# Patient Record
Sex: Female | Born: 1935
Health system: Southern US, Community
[De-identification: ages and names within clinical notes are randomized; demographics above are authoritative.]

## PROBLEM LIST (undated history)

## (undated) DIAGNOSIS — K589 Irritable bowel syndrome without diarrhea: Secondary | ICD-10-CM

## (undated) DIAGNOSIS — K219 Gastro-esophageal reflux disease without esophagitis: Secondary | ICD-10-CM

## (undated) DIAGNOSIS — K5792 Diverticulitis of intestine, part unspecified, without perforation or abscess without bleeding: Secondary | ICD-10-CM

## (undated) DIAGNOSIS — E785 Hyperlipidemia, unspecified: Secondary | ICD-10-CM

## (undated) DIAGNOSIS — C4491 Basal cell carcinoma of skin, unspecified: Secondary | ICD-10-CM

## (undated) DIAGNOSIS — K759 Inflammatory liver disease, unspecified: Secondary | ICD-10-CM

## (undated) DIAGNOSIS — K449 Diaphragmatic hernia without obstruction or gangrene: Secondary | ICD-10-CM

## (undated) DIAGNOSIS — I480 Paroxysmal atrial fibrillation: Secondary | ICD-10-CM

## (undated) DIAGNOSIS — Z87442 Personal history of urinary calculi: Secondary | ICD-10-CM

## (undated) DIAGNOSIS — Z9861 Coronary angioplasty status: Secondary | ICD-10-CM

## (undated) DIAGNOSIS — G43909 Migraine, unspecified, not intractable, without status migrainosus: Secondary | ICD-10-CM

## (undated) DIAGNOSIS — IMO0002 Reserved for concepts with insufficient information to code with codable children: Secondary | ICD-10-CM

## (undated) DIAGNOSIS — E039 Hypothyroidism, unspecified: Secondary | ICD-10-CM

## (undated) DIAGNOSIS — J189 Pneumonia, unspecified organism: Secondary | ICD-10-CM

## (undated) DIAGNOSIS — I2510 Atherosclerotic heart disease of native coronary artery without angina pectoris: Secondary | ICD-10-CM

## (undated) DIAGNOSIS — M1990 Unspecified osteoarthritis, unspecified site: Secondary | ICD-10-CM

## (undated) DIAGNOSIS — F419 Anxiety disorder, unspecified: Secondary | ICD-10-CM

## (undated) DIAGNOSIS — G4700 Insomnia, unspecified: Secondary | ICD-10-CM

## (undated) DIAGNOSIS — I10 Essential (primary) hypertension: Secondary | ICD-10-CM

## (undated) HISTORY — PX: CARPAL TUNNEL RELEASE: SHX101

## (undated) HISTORY — DX: Atherosclerotic heart disease of native coronary artery without angina pectoris: I25.10

## (undated) HISTORY — DX: Paroxysmal atrial fibrillation: I48.0

## (undated) HISTORY — PX: CATARACT EXTRACTION W/ INTRAOCULAR LENS  IMPLANT, BILATERAL: SHX1307

## (undated) HISTORY — PX: BASAL CELL CARCINOMA EXCISION: SHX1214

## (undated) HISTORY — DX: Hyperlipidemia, unspecified: E78.5

## (undated) HISTORY — PX: CHOLECYSTECTOMY OPEN: SUR202

## (undated) HISTORY — PX: ABDOMINAL HYSTERECTOMY: SHX81

## (undated) HISTORY — DX: Irritable bowel syndrome, unspecified: K58.9

## (undated) HISTORY — DX: Hypothyroidism, unspecified: E03.9

## (undated) HISTORY — DX: Insomnia, unspecified: G47.00

## (undated) HISTORY — DX: Essential (primary) hypertension: I10

## (undated) HISTORY — PX: CARDIAC CATHETERIZATION: SHX172

## (undated) HISTORY — PX: APPENDECTOMY: SHX54

## (undated) HISTORY — PX: SQUAMOUS CELL CARCINOMA EXCISION: SHX2433

## (undated) HISTORY — PX: CORONARY ANGIOPLASTY WITH STENT PLACEMENT: SHX49

## (undated) HISTORY — DX: Atherosclerotic heart disease of native coronary artery without angina pectoris: Z98.61

---

## 2011-01-05 HISTORY — PX: CORONARY ANGIOPLASTY WITH STENT PLACEMENT: SHX49

## 2011-11-20 DIAGNOSIS — J069 Acute upper respiratory infection, unspecified: Secondary | ICD-10-CM | POA: Diagnosis not present

## 2011-11-20 DIAGNOSIS — J209 Acute bronchitis, unspecified: Secondary | ICD-10-CM | POA: Diagnosis not present

## 2011-12-05 DIAGNOSIS — R002 Palpitations: Secondary | ICD-10-CM | POA: Diagnosis not present

## 2011-12-05 DIAGNOSIS — I2510 Atherosclerotic heart disease of native coronary artery without angina pectoris: Secondary | ICD-10-CM | POA: Diagnosis not present

## 2011-12-05 DIAGNOSIS — R079 Chest pain, unspecified: Secondary | ICD-10-CM | POA: Diagnosis not present

## 2011-12-05 DIAGNOSIS — I4891 Unspecified atrial fibrillation: Secondary | ICD-10-CM | POA: Diagnosis not present

## 2011-12-07 DIAGNOSIS — R002 Palpitations: Secondary | ICD-10-CM | POA: Diagnosis not present

## 2011-12-07 DIAGNOSIS — I4891 Unspecified atrial fibrillation: Secondary | ICD-10-CM | POA: Diagnosis not present

## 2011-12-19 DIAGNOSIS — R002 Palpitations: Secondary | ICD-10-CM | POA: Diagnosis not present

## 2011-12-19 DIAGNOSIS — E785 Hyperlipidemia, unspecified: Secondary | ICD-10-CM | POA: Diagnosis not present

## 2011-12-19 DIAGNOSIS — I4891 Unspecified atrial fibrillation: Secondary | ICD-10-CM | POA: Diagnosis not present

## 2011-12-19 DIAGNOSIS — I2510 Atherosclerotic heart disease of native coronary artery without angina pectoris: Secondary | ICD-10-CM | POA: Diagnosis not present

## 2012-01-05 DIAGNOSIS — I4891 Unspecified atrial fibrillation: Secondary | ICD-10-CM | POA: Diagnosis not present

## 2012-01-05 DIAGNOSIS — D539 Nutritional anemia, unspecified: Secondary | ICD-10-CM | POA: Diagnosis not present

## 2012-01-09 DIAGNOSIS — I2510 Atherosclerotic heart disease of native coronary artery without angina pectoris: Secondary | ICD-10-CM | POA: Diagnosis not present

## 2012-01-09 DIAGNOSIS — E785 Hyperlipidemia, unspecified: Secondary | ICD-10-CM | POA: Diagnosis not present

## 2012-01-09 DIAGNOSIS — I10 Essential (primary) hypertension: Secondary | ICD-10-CM | POA: Diagnosis not present

## 2012-01-09 DIAGNOSIS — I4891 Unspecified atrial fibrillation: Secondary | ICD-10-CM | POA: Diagnosis not present

## 2012-02-06 DIAGNOSIS — I4891 Unspecified atrial fibrillation: Secondary | ICD-10-CM | POA: Diagnosis not present

## 2012-02-06 DIAGNOSIS — I2510 Atherosclerotic heart disease of native coronary artery without angina pectoris: Secondary | ICD-10-CM | POA: Diagnosis not present

## 2012-02-06 DIAGNOSIS — E785 Hyperlipidemia, unspecified: Secondary | ICD-10-CM | POA: Diagnosis not present

## 2012-02-06 DIAGNOSIS — I10 Essential (primary) hypertension: Secondary | ICD-10-CM | POA: Diagnosis not present

## 2012-03-18 DIAGNOSIS — R05 Cough: Secondary | ICD-10-CM | POA: Diagnosis not present

## 2012-03-22 DIAGNOSIS — E7800 Pure hypercholesterolemia, unspecified: Secondary | ICD-10-CM | POA: Diagnosis not present

## 2012-03-22 DIAGNOSIS — E039 Hypothyroidism, unspecified: Secondary | ICD-10-CM | POA: Diagnosis not present

## 2012-03-22 DIAGNOSIS — E118 Type 2 diabetes mellitus with unspecified complications: Secondary | ICD-10-CM | POA: Diagnosis not present

## 2012-03-27 DIAGNOSIS — E119 Type 2 diabetes mellitus without complications: Secondary | ICD-10-CM | POA: Diagnosis not present

## 2012-05-01 DIAGNOSIS — N19 Unspecified kidney failure: Secondary | ICD-10-CM | POA: Diagnosis not present

## 2012-05-01 DIAGNOSIS — I10 Essential (primary) hypertension: Secondary | ICD-10-CM | POA: Diagnosis not present

## 2012-05-01 DIAGNOSIS — L659 Nonscarring hair loss, unspecified: Secondary | ICD-10-CM | POA: Diagnosis not present

## 2012-05-01 DIAGNOSIS — E039 Hypothyroidism, unspecified: Secondary | ICD-10-CM | POA: Diagnosis not present

## 2012-05-01 DIAGNOSIS — E038 Other specified hypothyroidism: Secondary | ICD-10-CM | POA: Diagnosis not present

## 2012-05-06 DIAGNOSIS — I10 Essential (primary) hypertension: Secondary | ICD-10-CM | POA: Diagnosis not present

## 2012-05-06 DIAGNOSIS — Z01810 Encounter for preprocedural cardiovascular examination: Secondary | ICD-10-CM | POA: Diagnosis not present

## 2012-05-06 DIAGNOSIS — E782 Mixed hyperlipidemia: Secondary | ICD-10-CM | POA: Diagnosis not present

## 2012-05-06 DIAGNOSIS — E785 Hyperlipidemia, unspecified: Secondary | ICD-10-CM | POA: Diagnosis not present

## 2012-05-06 DIAGNOSIS — R079 Chest pain, unspecified: Secondary | ICD-10-CM | POA: Diagnosis not present

## 2012-05-06 DIAGNOSIS — I4891 Unspecified atrial fibrillation: Secondary | ICD-10-CM | POA: Diagnosis not present

## 2012-05-06 DIAGNOSIS — I2510 Atherosclerotic heart disease of native coronary artery without angina pectoris: Secondary | ICD-10-CM | POA: Diagnosis not present

## 2012-05-07 DIAGNOSIS — D649 Anemia, unspecified: Secondary | ICD-10-CM | POA: Diagnosis not present

## 2012-05-07 DIAGNOSIS — R17 Unspecified jaundice: Secondary | ICD-10-CM | POA: Diagnosis not present

## 2012-06-14 DIAGNOSIS — E039 Hypothyroidism, unspecified: Secondary | ICD-10-CM | POA: Diagnosis not present

## 2012-06-18 DIAGNOSIS — E039 Hypothyroidism, unspecified: Secondary | ICD-10-CM | POA: Diagnosis not present

## 2012-06-18 DIAGNOSIS — Z23 Encounter for immunization: Secondary | ICD-10-CM | POA: Diagnosis not present

## 2012-06-18 DIAGNOSIS — E119 Type 2 diabetes mellitus without complications: Secondary | ICD-10-CM | POA: Diagnosis not present

## 2012-07-10 DIAGNOSIS — K589 Irritable bowel syndrome without diarrhea: Secondary | ICD-10-CM | POA: Diagnosis not present

## 2012-07-10 DIAGNOSIS — K591 Functional diarrhea: Secondary | ICD-10-CM | POA: Diagnosis not present

## 2012-07-10 DIAGNOSIS — K5730 Diverticulosis of large intestine without perforation or abscess without bleeding: Secondary | ICD-10-CM | POA: Diagnosis not present

## 2012-07-10 DIAGNOSIS — R109 Unspecified abdominal pain: Secondary | ICD-10-CM | POA: Diagnosis not present

## 2012-07-12 DIAGNOSIS — K5732 Diverticulitis of large intestine without perforation or abscess without bleeding: Secondary | ICD-10-CM | POA: Diagnosis not present

## 2012-07-22 DIAGNOSIS — K5732 Diverticulitis of large intestine without perforation or abscess without bleeding: Secondary | ICD-10-CM | POA: Diagnosis not present

## 2012-07-22 DIAGNOSIS — K921 Melena: Secondary | ICD-10-CM | POA: Diagnosis not present

## 2012-09-23 DIAGNOSIS — I10 Essential (primary) hypertension: Secondary | ICD-10-CM | POA: Diagnosis not present

## 2012-09-23 DIAGNOSIS — E039 Hypothyroidism, unspecified: Secondary | ICD-10-CM | POA: Diagnosis not present

## 2012-09-23 DIAGNOSIS — E119 Type 2 diabetes mellitus without complications: Secondary | ICD-10-CM | POA: Diagnosis not present

## 2012-10-17 DIAGNOSIS — Z1231 Encounter for screening mammogram for malignant neoplasm of breast: Secondary | ICD-10-CM | POA: Diagnosis not present

## 2012-10-17 DIAGNOSIS — R922 Inconclusive mammogram: Secondary | ICD-10-CM | POA: Diagnosis not present

## 2012-10-18 DIAGNOSIS — Z1231 Encounter for screening mammogram for malignant neoplasm of breast: Secondary | ICD-10-CM | POA: Diagnosis not present

## 2012-12-26 DIAGNOSIS — K219 Gastro-esophageal reflux disease without esophagitis: Secondary | ICD-10-CM | POA: Diagnosis not present

## 2012-12-26 DIAGNOSIS — I10 Essential (primary) hypertension: Secondary | ICD-10-CM | POA: Diagnosis not present

## 2012-12-26 DIAGNOSIS — G4700 Insomnia, unspecified: Secondary | ICD-10-CM | POA: Diagnosis not present

## 2012-12-26 DIAGNOSIS — E785 Hyperlipidemia, unspecified: Secondary | ICD-10-CM | POA: Diagnosis not present

## 2013-01-06 DIAGNOSIS — I4891 Unspecified atrial fibrillation: Secondary | ICD-10-CM | POA: Diagnosis not present

## 2013-01-06 DIAGNOSIS — I2510 Atherosclerotic heart disease of native coronary artery without angina pectoris: Secondary | ICD-10-CM | POA: Diagnosis not present

## 2013-01-06 DIAGNOSIS — I10 Essential (primary) hypertension: Secondary | ICD-10-CM | POA: Diagnosis not present

## 2013-02-06 DIAGNOSIS — Z0000 Encounter for general adult medical examination without abnormal findings: Secondary | ICD-10-CM | POA: Diagnosis not present

## 2013-02-06 DIAGNOSIS — I10 Essential (primary) hypertension: Secondary | ICD-10-CM | POA: Diagnosis not present

## 2013-02-06 DIAGNOSIS — I4891 Unspecified atrial fibrillation: Secondary | ICD-10-CM | POA: Diagnosis not present

## 2013-02-06 DIAGNOSIS — G4700 Insomnia, unspecified: Secondary | ICD-10-CM | POA: Diagnosis not present

## 2013-02-06 DIAGNOSIS — E039 Hypothyroidism, unspecified: Secondary | ICD-10-CM | POA: Diagnosis not present

## 2013-02-06 DIAGNOSIS — E785 Hyperlipidemia, unspecified: Secondary | ICD-10-CM | POA: Diagnosis not present

## 2013-02-13 DIAGNOSIS — E785 Hyperlipidemia, unspecified: Secondary | ICD-10-CM | POA: Diagnosis not present

## 2013-02-13 DIAGNOSIS — E039 Hypothyroidism, unspecified: Secondary | ICD-10-CM | POA: Diagnosis not present

## 2013-02-13 DIAGNOSIS — I10 Essential (primary) hypertension: Secondary | ICD-10-CM | POA: Diagnosis not present

## 2013-02-13 DIAGNOSIS — I2510 Atherosclerotic heart disease of native coronary artery without angina pectoris: Secondary | ICD-10-CM | POA: Diagnosis not present

## 2013-03-05 ENCOUNTER — Telehealth: Payer: Self-pay | Admitting: Cardiovascular Disease

## 2013-03-05 NOTE — Telephone Encounter (Signed)
Jeanne Hart need a prescription sent to [facility] on [address]  for rapid heart beat .Marland Kitchen The prescription ran out and she could not transfer it to the [facility] here. Please call her if any questions [phone]   Thanks

## 2013-03-07 MED ORDER — AMIODARONE HCL 200 MG PO TABS: 200.0000 mg | ORAL_TABLET | Freq: Every day | ORAL | Status: DC

## 2013-03-07 NOTE — Telephone Encounter (Signed)
rx sent for amiodarone

## 2013-03-07 NOTE — Telephone Encounter (Signed)
Grandson is calling-Still no medicine is at the pharmacy-He does not want there to do without her medicine this week-end! Please call this in by 12 today if possible! She need her Amiodarone 200mg 

## 2013-03-07 NOTE — Telephone Encounter (Signed)
Grandson called-she still have not received her medicine -been waiting since Wednesday-She must have her medicine this week-end! Please,please call

## 2013-03-10 ENCOUNTER — Telehealth: Payer: Self-pay | Admitting: Cardiovascular Disease

## 2013-03-10 NOTE — Telephone Encounter (Signed)
Noted  

## 2013-03-10 NOTE — Telephone Encounter (Signed)
Daughter,Deborah called and wanted [place] to make sure that from now on we make make sure all her medicine goes to [facility] -[address]DGU[address][phone]!

## 2013-04-16 DIAGNOSIS — A088 Other specified intestinal infections: Secondary | ICD-10-CM | POA: Diagnosis not present

## 2013-04-16 DIAGNOSIS — G4700 Insomnia, unspecified: Secondary | ICD-10-CM | POA: Diagnosis not present

## 2013-04-29 ENCOUNTER — Encounter: Payer: Self-pay | Admitting: Physician Assistant

## 2013-04-29 ENCOUNTER — Ambulatory Visit (INDEPENDENT_AMBULATORY_CARE_PROVIDER_SITE_OTHER): Payer: Medicare Other | Admitting: Physician Assistant

## 2013-04-29 VITALS — BP 136/82 | HR 51 | Ht 67.0 in | Wt 166.0 lb

## 2013-04-29 DIAGNOSIS — I2510 Atherosclerotic heart disease of native coronary artery without angina pectoris: Secondary | ICD-10-CM

## 2013-04-29 DIAGNOSIS — I480 Paroxysmal atrial fibrillation: Secondary | ICD-10-CM

## 2013-04-29 DIAGNOSIS — I10 Essential (primary) hypertension: Secondary | ICD-10-CM | POA: Diagnosis not present

## 2013-04-29 DIAGNOSIS — R079 Chest pain, unspecified: Secondary | ICD-10-CM

## 2013-04-29 DIAGNOSIS — Z8719 Personal history of other diseases of the digestive system: Secondary | ICD-10-CM

## 2013-04-29 DIAGNOSIS — R002 Palpitations: Secondary | ICD-10-CM

## 2013-04-29 DIAGNOSIS — I4891 Unspecified atrial fibrillation: Secondary | ICD-10-CM

## 2013-04-29 DIAGNOSIS — E785 Hyperlipidemia, unspecified: Secondary | ICD-10-CM | POA: Diagnosis not present

## 2013-04-29 NOTE — Assessment & Plan Note (Signed)
Treated with pravastatin 

## 2013-04-29 NOTE — Progress Notes (Signed)
Date:  04/29/2013   ID:  Jeanne Hart, DOB Sep 14, 1927, MRN [id number]  PCP:  Thayer Headings, MD  Primary Cardiologist:  Allyson Sabal    History of Present Illness: Jeanne Hart is a 79 y.o. female a history of coronary artery disease, hypertension, dyslipidemia, IBS hypothyroidism, strong family history of heart disease, paroxysmal A. show fibrillation on amiodarone patient moved to [place] from [place] this past winter with closer to her family. She's had a total of 4 stents placed to her coronary arteries. 3 of which are at [facility] and the other one at [place]. Her last nuclear stress test was December 2012 was considered a low-risk test the ejection fraction 76%.    Patient presents today secondary to heart palpitations and lower extremity edema or states that she gets episodes of palpitations once and awhile, which last a short period of time, maybe several minutes and resolve spontaneously. She also reports being feeling weak in the legs. She's had 2 episodes of chest pain which she reports as tightness in 4 of 10 in intensity, with maybe a little bit of diaphoresis. When she had her first episode the other day he took one sublingual nitroglycerin glycerin which would provide relief after a few minutes.  She had an episode this morning which resolved on its own. She also reports dyspnea on exertion when she walks on a hot day and she does a lot of walking apparently.  She also reported one week where she had a few episodes on mild nausea.    The patient currently denies vomiting, fever, shortness of breath, orthopnea, dizziness, PND, cough, congestion, abdominal pain, hematochezia, melena, lower extremity edema, claudication.  Wt Readings from Last 3 Encounters:  04/29/13 166 lb (75.297 kg)     Past Medical History  Diagnosis Date  . Insomnia     chronic  . IBS (irritable bowel syndrome)   . HTN (hypertension)   . Dyslipidemia   . Family history of cardiovascular disease   .  PAF (paroxysmal atrial fibrillation)     coumadin was stopped secondary to GI Bleed, on amiodarone; monitor march 2014-NSR  . CAD (coronary artery disease)     PCI x 2  . Hypothyroidism     Current Outpatient Prescriptions  Medication Sig Dispense Refill  . amiodarone (PACERONE) 200 MG tablet Take 1 tablet (200 mg total) by mouth daily.  30 tablet  6  . aspirin 325 MG EC tablet Take 325 mg by mouth daily.      Marland Kitchen atenolol (TENORMIN) 25 MG tablet Take 25 mg by mouth daily.      Marland Kitchen levothyroxine (SYNTHROID, LEVOTHROID) 100 MCG tablet Take 100 mcg by mouth daily before breakfast.      . pantoprazole (PROTONIX) 40 MG tablet Take 40 mg by mouth daily.      Marland Kitchen zolpidem (AMBIEN CR) 12.5 MG CR tablet Take 12.5 mg by mouth every other day.       No current facility-administered medications for this visit.    Allergies:    Allergies  Allergen Reactions  . Nexium (Esomeprazole)   . Penicillins   . Statins   . Sulfa Antibiotics     Social History:  The patient  reports that she has never smoked. She does not have any smokeless tobacco history on file. She reports that she does not drink alcohol.   Family history:  Strong family history of heart disease. Father had an MI. 2 brothers have had heart disease and one  died at age 51 from MI.  The other had bypass surgery at age 86.  ROS:  Please see the history of present illness.  All other systems reviewed and negative.   PHYSICAL EXAM: VS:  BP 136/82  Pulse 51  Ht 5\' 7"  (1.702 m)  Wt 166 lb (75.297 kg)  BMI 25.99 kg/m2 Well nourished, well developed, in no acute distress HEENT: Pupils are equal round react to light accommodation extraocular movements are intact.  Neck: no JVDNo cervical lymphadenopathy. Cardiac: Regular rate and rhythm without murmurs rubs or gallops. Lungs:  clear to auscultation bilaterally, no wheezing, rhonchi or rales Abd: soft, nontender, positive bowel sounds all quadrants, no hepatosplenomegaly Ext: no lower  extremity edema.  2+ radial and dorsalis pedis pulses. Skin: warm and dry Neuro:  Grossly normal.  Strength is equal in upper and lower extremities about 4/5 in the upper extremities  EKG:  Sinus bradycardia rate of 51 beats per minute no acute changes noted.  Mildly prolonged QTC of 470 ms.  ASSESSMENT AND PLAN:  Problem List Items Addressed This Visit   Paroxysmal atrial fibrillation     Currently maintaining sinus bradycardia.  No acute changes noted. Taking amiodarone 200mg  daily ans full dose ASA. She had previous been treated with Coumadin however this was stopped due to GI bleeding.    Relevant Medications      aspirin 325 MG EC tablet      atenolol (TENORMIN) 25 MG tablet   Palpitations - Primary     If the patient experiences more prolonged episodes of palpitations, she can take an additional atenolol 25 mg. If the episodes become more frequent, and she has associated dizziness, I have asked her to call office for appointment as she may be experiencing atrial fibrillation and need to wear a CardioNet monitor for a more prolonged period of time.      Relevant Orders      EKG 12-Lead   History of GI bleed   Essential hypertension     Blood pressure is only mildly elevated at this time. Appropriate medications.    Relevant Medications      aspirin 325 MG EC tablet      atenolol (TENORMIN) 25 MG tablet   Dyslipidemia     Treated with pravastatin    Coronary artery disease   Relevant Medications      aspirin 325 MG EC tablet      atenolol (TENORMIN) 25 MG tablet   Chest pain     Infrequent episodes which have not occurred while walking.  No acute EKG changes.  NTG took a few minutes to resolve.  I do not believe her symptoms are anginal.  She will call if they become more frequent or worsen.

## 2013-04-29 NOTE — Assessment & Plan Note (Signed)
If the patient experiences more prolonged episodes of palpitations, she can take an additional atenolol 25 mg. If the episodes become more frequent, and she has associated dizziness, I have asked her to call office for appointment as she may be experiencing atrial fibrillation and need to wear a CardioNet monitor for a more prolonged period of time.

## 2013-04-29 NOTE — Assessment & Plan Note (Signed)
Blood pressure is only mildly elevated at this time. Appropriate medications.

## 2013-04-29 NOTE — Assessment & Plan Note (Signed)
Infrequent episodes which have not occurred while walking.  No acute EKG changes.  NTG took a few minutes to resolve.  I do not believe her symptoms are anginal.  She will call if they become more frequent or worsen.

## 2013-04-29 NOTE — Patient Instructions (Signed)
If you find yourself having palpitations for a prolonged period of time like 20-30 minutes, go ahead and take an extra atenolol 25 mg. If her frequency of palpitations increases to 3-4 times per week, for extended periods of time, call our office we may need to put you back on a cardiac monitor.  Also called the having worsening or more frequent chest pain.  Otherwise, you can followup with Dr. Gery Pray in six-months.

## 2013-04-29 NOTE — Assessment & Plan Note (Addendum)
Currently maintaining sinus bradycardia.  No acute changes noted. Taking amiodarone 200mg  daily ans full dose ASA. She had previous been treated with Coumadin however this was stopped due to GI bleeding.

## 2013-05-27 DIAGNOSIS — D485 Neoplasm of uncertain behavior of skin: Secondary | ICD-10-CM | POA: Diagnosis not present

## 2013-05-27 DIAGNOSIS — D239 Other benign neoplasm of skin, unspecified: Secondary | ICD-10-CM | POA: Diagnosis not present

## 2013-05-27 DIAGNOSIS — L821 Other seborrheic keratosis: Secondary | ICD-10-CM | POA: Diagnosis not present

## 2013-05-27 DIAGNOSIS — L723 Sebaceous cyst: Secondary | ICD-10-CM | POA: Diagnosis not present

## 2013-07-15 ENCOUNTER — Encounter: Payer: Self-pay | Admitting: Cardiovascular Disease

## 2013-07-16 ENCOUNTER — Encounter: Payer: Self-pay | Admitting: Cardiovascular Disease

## 2013-07-16 ENCOUNTER — Ambulatory Visit (INDEPENDENT_AMBULATORY_CARE_PROVIDER_SITE_OTHER): Payer: Medicare Other | Admitting: Cardiovascular Disease

## 2013-07-16 VITALS — BP 168/70 | HR 53 | Ht 67.0 in | Wt 165.1 lb

## 2013-07-16 DIAGNOSIS — R079 Chest pain, unspecified: Secondary | ICD-10-CM | POA: Diagnosis not present

## 2013-07-16 DIAGNOSIS — Z79899 Other long term (current) drug therapy: Secondary | ICD-10-CM | POA: Diagnosis not present

## 2013-07-16 DIAGNOSIS — R0609 Other forms of dyspnea: Secondary | ICD-10-CM | POA: Diagnosis not present

## 2013-07-16 DIAGNOSIS — R002 Palpitations: Secondary | ICD-10-CM | POA: Diagnosis not present

## 2013-07-16 DIAGNOSIS — R5381 Other malaise: Secondary | ICD-10-CM

## 2013-07-16 DIAGNOSIS — R0600 Dyspnea, unspecified: Secondary | ICD-10-CM

## 2013-07-16 DIAGNOSIS — I2510 Atherosclerotic heart disease of native coronary artery without angina pectoris: Secondary | ICD-10-CM

## 2013-07-16 DIAGNOSIS — E785 Hyperlipidemia, unspecified: Secondary | ICD-10-CM

## 2013-07-16 DIAGNOSIS — I10 Essential (primary) hypertension: Secondary | ICD-10-CM | POA: Diagnosis not present

## 2013-07-16 NOTE — Progress Notes (Signed)
07/16/2013 Jeanne Hart   July 04, 1927  [id number]  Primary Physician Thayer Headings, MD Primary Cardiologist: Runell Gess MD Roseanne Reno   HPI:  Jeanne Hart is a 79 y.o. female a history of coronary artery disease, hypertension, dyslipidemia, IBS hypothyroidism, strong family history of heart disease, paroxysmal A. show fibrillation on amiodarone patient moved to [place] from [place] this past winter with closer to her family. She's had a total of 4 stents placed to her coronary arteries. 3 of which are at [facility] and the other one at [place]. Her last nuclear stress test was December 2012 was considered a low-risk test the ejection fraction 76%.  Patient presents today secondary to heart palpitations and lower extremity edema or states that she gets episodes of palpitations once and awhile, which last a short period of time, maybe several minutes and resolve spontaneously. She also reports being feeling weak in the legs. She's had 2 episodes of chest pain which she reports as tightness in 4 of 10 in intensity, with maybe a little bit of diaphoresis. When she had her first episode the other day he took one sublingual nitroglycerin glycerin which would provide relief after a few minutes. She had an episode this morning which resolved on its own. She also reports dyspnea on exertion when she walks on a hot day and she does a lot of walking apparently. She also reported one week where she had a few episodes on mild nausea.  The patient currently denies vomiting, fever, shortness of breath, orthopnea, dizziness, PND, cough, congestion, abdominal pain, hematochezia, melena, lower extremity edema, claudication. She saw Huey Bienenstock [facility] in the office 04/29/13. She still complains of dyspnea on exertion and weekly chest pain as well as palpitations.    Current Outpatient Prescriptions  Medication Sig Dispense Refill  . amiodarone (PACERONE) 200 MG tablet Take 1 tablet (200 mg  total) by mouth daily.  30 tablet  6  . aspirin 325 MG EC tablet Take 325 mg by mouth daily.      Marland Kitchen atenolol (TENORMIN) 25 MG tablet Take 25 mg by mouth daily.      Marland Kitchen ipratropium (ATROVENT) 0.03 % nasal spray Place 1 spray into the nose as needed.      Marland Kitchen levothyroxine (SYNTHROID, LEVOTHROID) 100 MCG tablet Take 100 mcg by mouth daily before breakfast.      . pantoprazole (PROTONIX) 40 MG tablet Take 40 mg by mouth daily.      . pravastatin (PRAVACHOL) 40 MG tablet Take 40 mg by mouth daily.      Marland Kitchen zolpidem (AMBIEN CR) 12.5 MG CR tablet Take 12.5 mg by mouth every other day.       No current facility-administered medications for this visit.    Allergies  Allergen Reactions  . Nexium [Esomeprazole]   . Penicillins   . Statins   . Sulfa Antibiotics     History   Social History  . Marital Status: Married    Spouse Name: N/A    Number of Children: N/A  . Years of Education: N/A   Occupational History  . Not on file.   Social History Main Topics  . Smoking status: Never Smoker   . Smokeless tobacco: Not on file  . Alcohol Use: No  . Drug Use: Not on file  . Sexual Activity: Not on file   Other Topics Concern  . Not on file   Social History Narrative  . No narrative on file  Review of Systems: General: negative for chills, fever, night sweats or weight changes.  Cardiovascular: negative for chest pain, dyspnea on exertion, edema, orthopnea, palpitations, paroxysmal nocturnal dyspnea or shortness of breath Dermatological: negative for rash Respiratory: negative for cough or wheezing Urologic: negative for hematuria Abdominal: negative for nausea, vomiting, diarrhea, bright red blood per rectum, melena, or hematemesis Neurologic: negative for visual changes, syncope, or dizziness All other systems reviewed and are otherwise negative except as noted above.    Blood pressure 168/70, pulse 53, height 5\' 7"  (1.702 m), weight 165 lb 1.6 oz (74.889 kg).  General  appearance: alert and no distress Neck: no adenopathy, no carotid bruit, no JVD, supple, symmetrical, trachea midline and thyroid not enlarged, symmetric, no tenderness/mass/nodules Lungs: clear to auscultation bilaterally Heart: regular rate and rhythm, S1, S2 normal, no murmur, click, rub or gallop Extremities: extremities normal, atraumatic, no cyanosis or edema  EKG sinus bradycardia at 53 without ST or T wave changes  ASSESSMENT AND PLAN:   Chest pain Patient gets substernal chest pain one to 2 times a week. She has known CAD with 4 stents in her heart. She does have dyspnea on exertion. I'm going to get a pharmacologic Myoview stress test as well as a 2-D echocardiogram.  Dyslipidemia On statin therapy. We will recheck a lipid and liver profile  Paroxysmal atrial fibrillation Not on oral hydration because of prior GI bleed. She does complain of palpitations. She is on amiodarone. I am going to get a vent monitor for 30 days and review  Essential hypertension Under fair control on current medications      Runell Gess MD [facility], [facility] 07/16/2013 11:29 AM

## 2013-07-16 NOTE — Assessment & Plan Note (Signed)
Not on oral hydration because of prior GI bleed. She does complain of palpitations. She is on amiodarone. I am going to get a vent monitor for 30 days and review

## 2013-07-16 NOTE — Assessment & Plan Note (Signed)
Patient gets substernal chest pain one to 2 times a week. She has known CAD with 4 stents in her heart. She does have dyspnea on exertion. I'm going to get a pharmacologic Myoview stress test as well as a 2-D echocardiogram.

## 2013-07-16 NOTE — Assessment & Plan Note (Signed)
Under fair control on current medications

## 2013-07-16 NOTE — Patient Instructions (Signed)
Your physician recommends that you schedule a follow-up appointment in: 6 Weeks  Your physician recommends that you return for lab work in: CBC, CMP, LIPIDS, TSH  Your physician has requested that you have a lexiscan myoview. For further information please visit [email or web address] Please follow instruction sheet, as given.  Your physician has requested that you have an echocardiogram. Echocardiography is a painless test that uses sound waves to create images of your heart. It provides your doctor with information about the size and shape of your heart and how well your heart's chambers and valves are working. This procedure takes approximately one hour. There are no restrictions for this procedure.  Your physician has recommended that you wear an event monitor. Event monitors are medical devices that record the heart's electrical activity. Doctors most often [place] these monitors to diagnose arrhythmias. Arrhythmias are problems with the speed or rhythm of the heartbeat. The monitor is a small, portable device. You can wear one while you do your normal daily activities. This is usually used to diagnose what is causing palpitations/syncope (passing out).

## 2013-07-16 NOTE — Assessment & Plan Note (Signed)
On statin therapy. We will recheck a lipid and liver profile 

## 2013-07-17 ENCOUNTER — Encounter: Payer: Self-pay | Admitting: Cardiovascular Disease

## 2013-07-17 DIAGNOSIS — R002 Palpitations: Secondary | ICD-10-CM | POA: Diagnosis not present

## 2013-07-21 ENCOUNTER — Telehealth (HOSPITAL_COMMUNITY): Payer: Self-pay | Admitting: *Deleted

## 2013-07-21 NOTE — Telephone Encounter (Signed)
FYI-Pt called and cancelled stress test appt due to experiencing some rapid heart beats last night.

## 2013-07-22 NOTE — Telephone Encounter (Signed)
I spoke with patient and encouraged her to reschedule the stress test.  She agreed to reschedule.  I will send this message back to [facility] for the test to be rescheduled.

## 2013-07-23 ENCOUNTER — Encounter (HOSPITAL_COMMUNITY): Payer: Medicare Other

## 2013-07-29 ENCOUNTER — Ambulatory Visit (HOSPITAL_COMMUNITY)
Admission: RE | Admit: 2013-07-29 | Discharge: 2013-07-29 | Disposition: A | Discharge status: Home / Self Care | Payer: Medicare Other | Source: Ambulatory Visit | Attending: Cardiovascular Disease | Admitting: Cardiovascular Disease

## 2013-07-29 DIAGNOSIS — R079 Chest pain, unspecified: Secondary | ICD-10-CM

## 2013-07-29 DIAGNOSIS — I2510 Atherosclerotic heart disease of native coronary artery without angina pectoris: Secondary | ICD-10-CM | POA: Insufficient documentation

## 2013-07-29 DIAGNOSIS — R002 Palpitations: Secondary | ICD-10-CM

## 2013-07-29 DIAGNOSIS — R0600 Dyspnea, unspecified: Secondary | ICD-10-CM

## 2013-07-29 DIAGNOSIS — R0989 Other specified symptoms and signs involving the circulatory and respiratory systems: Secondary | ICD-10-CM | POA: Diagnosis not present

## 2013-07-29 DIAGNOSIS — R0609 Other forms of dyspnea: Secondary | ICD-10-CM

## 2013-07-29 DIAGNOSIS — R9431 Abnormal electrocardiogram [ECG] [EKG]: Secondary | ICD-10-CM | POA: Insufficient documentation

## 2013-07-29 DIAGNOSIS — R42 Dizziness and giddiness: Secondary | ICD-10-CM | POA: Diagnosis not present

## 2013-07-29 DIAGNOSIS — I10 Essential (primary) hypertension: Secondary | ICD-10-CM | POA: Diagnosis not present

## 2013-07-29 DIAGNOSIS — Z8249 Family history of ischemic heart disease and other diseases of the circulatory system: Secondary | ICD-10-CM | POA: Diagnosis not present

## 2013-07-29 DIAGNOSIS — Z9861 Coronary angioplasty status: Secondary | ICD-10-CM | POA: Diagnosis not present

## 2013-07-29 MED ORDER — TECHNETIUM TC 99M SESTAMIBI GENERIC - CARDIOLITE
32.8000 | Freq: Once | INTRAVENOUS | Status: AC | PRN
  Administered 2013-07-29: 32.8 via INTRAVENOUS

## 2013-07-29 MED ORDER — TECHNETIUM TC 99M SESTAMIBI GENERIC - CARDIOLITE
10.8000 | Freq: Once | INTRAVENOUS | Status: AC | PRN
  Administered 2013-07-29: 11 via INTRAVENOUS

## 2013-07-29 MED ORDER — REGADENOSON 0.4 MG/5ML IV SOLN
0.4000 mg | Freq: Once | INTRAVENOUS | Status: AC
  Administered 2013-07-29: 0.4 mg via INTRAVENOUS

## 2013-07-29 NOTE — Procedures (Addendum)
[facility] [address] [place] [address] [place] [address] [phone]  Cardiology Nuclear Med Study  Jeanne Hart is a 79 y.o. female     MRN : [id number]     DOB: 1927-02-07  Procedure Date: 07/29/2013  Nuclear Med Background Indication for Stress Test:  Evaluation for Ischemia, Stent Patency and Abnormal EKG History:  CAD;STENT X4/PTCA Cardiac Risk Factors: Family History - CAD, Hypertension and Lipids  Symptoms:  Chest Pain, Dizziness, DOE, Fatigue, Palpitations and SOB   Nuclear Pre-Procedure Caffeine/Decaff Intake:  7:00pm NPO After: 5:00am   IV Site: R Hand  IV 0.9% NS with Angio Cath:  22g  Chest Size (in):  N/A IV Started by: Emmit Pomfret, RN  Height: 5\' 7"  (1.702 m)  Cup Size: D  BMI:  Body mass index is 25.84 kg/(m^2). Weight:  165 lb (74.844 kg)   Tech Comments:  N/A    Nuclear Med Study 1 or 2 day study: 1 day  Stress Test Type:  Lexiscan  Order Authorizing Provider:  Nanetta Batty, MD   Resting Radionuclide: Technetium 59m Sestamibi  Resting Radionuclide Dose: 10.8 mCi   Stress Radionuclide:  Technetium 71m Sestamibi  Stress Radionuclide Dose: 32.8 mCi           Stress Protocol Rest HR:52 Stress HR:  57  Rest BP:  179/100 Stress BP:  178/96  Exercise Time (min): n/a METS: n/a          Dose of Adenosine (mg):  n/a Dose of Lexiscan: 0.4 mg  Dose of Atropine (mg): n/a Dose of Dobutamine: n/a mcg/kg/min (at max HR)  Stress Test Technologist: Esperanza Sheets, CCT Nuclear Technologist: Gonzella Lex, CNMT   Rest Procedure:  Myocardial perfusion imaging was performed at rest 45 minutes following the intravenous administration of Technetium 59m Sestamibi. Stress Procedure:  The patient received IV Lexiscan 0.4 mg over 15-seconds.  Technetium 99m Sestamibi injected at 30-seconds.  There were no significant changes with Lexiscan.  Quantitative spect images were obtained after a 45 minute delay.  Transient  Ischemic Dilatation (Normal <1.22):  1.06 Lung/Heart Ratio (Normal <0.45):  0.23 QGS EDV:  78 ml QGS ESV:  23 ml LV Ejection Fraction: 66%     Rest ECG: NSR - Normal EKG  Stress ECG: No significant change from baseline ECG  QPS Raw Data Images:  Normal; no motion artifact; normal heart/lung ratio. Stress Images:  Normal homogeneous uptake in all areas of the myocardium. Rest Images:  Normal homogeneous uptake in all areas of the myocardium. Subtraction (SDS):  No evidence of ischemia. LV Wall Motion:  NL LV Function; NL Wall Motion  Impression Exercise Capacity:  Lexiscan with no exercise. BP Response:  Normal blood pressure response. Clinical Symptoms:  No significant symptoms noted. ECG Impression:  No significant ECG changes with Lexiscan. Comparison with Prior Nuclear Study: No change from previous nuclear study performed 2012.   Overall Impression:  Normal stress nuclear study.   Jeanne Poucher, MD  07/29/2013 1:20 PM

## 2013-07-31 ENCOUNTER — Ambulatory Visit (HOSPITAL_COMMUNITY)
Admission: RE | Admit: 2013-07-31 | Discharge: 2013-07-31 | Disposition: A | Discharge status: Home / Self Care | Payer: Medicare Other | Source: Ambulatory Visit | Attending: Cardiology | Admitting: Cardiology

## 2013-07-31 DIAGNOSIS — R002 Palpitations: Secondary | ICD-10-CM | POA: Insufficient documentation

## 2013-07-31 DIAGNOSIS — R0609 Other forms of dyspnea: Secondary | ICD-10-CM | POA: Insufficient documentation

## 2013-07-31 DIAGNOSIS — R0989 Other specified symptoms and signs involving the circulatory and respiratory systems: Secondary | ICD-10-CM | POA: Insufficient documentation

## 2013-07-31 DIAGNOSIS — R0600 Dyspnea, unspecified: Secondary | ICD-10-CM

## 2013-07-31 DIAGNOSIS — R0602 Shortness of breath: Secondary | ICD-10-CM

## 2013-07-31 DIAGNOSIS — R079 Chest pain, unspecified: Secondary | ICD-10-CM | POA: Insufficient documentation

## 2013-07-31 NOTE — Progress Notes (Signed)
2D Echo Performed 07/31/2013    Lezlie Ritchey, RCS  

## 2013-08-01 ENCOUNTER — Encounter: Payer: Self-pay | Admitting: *Deleted

## 2013-08-04 DIAGNOSIS — Z23 Encounter for immunization: Secondary | ICD-10-CM | POA: Diagnosis not present

## 2013-08-18 ENCOUNTER — Encounter: Payer: Self-pay | Admitting: Gastroenterology

## 2013-08-26 ENCOUNTER — Other Ambulatory Visit: Payer: Self-pay | Admitting: *Deleted

## 2013-08-26 DIAGNOSIS — R002 Palpitations: Secondary | ICD-10-CM

## 2013-08-26 DIAGNOSIS — R079 Chest pain, unspecified: Secondary | ICD-10-CM

## 2013-08-26 DIAGNOSIS — R0600 Dyspnea, unspecified: Secondary | ICD-10-CM

## 2013-08-27 ENCOUNTER — Encounter: Payer: Self-pay | Admitting: Cardiovascular Disease

## 2013-08-27 ENCOUNTER — Ambulatory Visit (INDEPENDENT_AMBULATORY_CARE_PROVIDER_SITE_OTHER): Payer: Medicare Other | Admitting: Cardiovascular Disease

## 2013-08-27 VITALS — BP 212/90 | HR 56 | Ht 67.0 in | Wt 160.0 lb

## 2013-08-27 DIAGNOSIS — I480 Paroxysmal atrial fibrillation: Secondary | ICD-10-CM

## 2013-08-27 DIAGNOSIS — I10 Essential (primary) hypertension: Secondary | ICD-10-CM

## 2013-08-27 DIAGNOSIS — I4891 Unspecified atrial fibrillation: Secondary | ICD-10-CM | POA: Diagnosis not present

## 2013-08-27 DIAGNOSIS — I2510 Atherosclerotic heart disease of native coronary artery without angina pectoris: Secondary | ICD-10-CM

## 2013-08-27 NOTE — Patient Instructions (Signed)
Your physician wants you to follow-up in: 6 months with an extender and 1 year with Dr Berry. You will receive a reminder letter in the mail two months in advance. If you don't receive a letter, please call our office to schedule the follow-up appointment.  

## 2013-08-27 NOTE — Progress Notes (Signed)
08/27/2013 Jeanne Hart   July 11, 1927  [id number]  Primary Physician Thayer Headings, MD Primary Cardiologist: Runell Gess MD Roseanne Reno   HPI:  Jeanne Hart is a 79 y.o. female a history of coronary artery disease, hypertension, dyslipidemia, IBS hypothyroidism, strong family history of heart disease, paroxysmal A. show fibrillation on amiodarone patient moved to [place] from [place] this past winter with closer to her family. She's had a total of 4 stents placed to her coronary arteries. 3 of which are at [facility] and the other one at [place]. Her last nuclear stress test was December 2012 was considered a low-risk test the ejection fraction 76%.  Patient presents today secondary to heart palpitations and lower extremity edema or states that she gets episodes of palpitations once and awhile, which last a short period of time, maybe several minutes and resolve spontaneously. She also reports being feeling weak in the legs. She's had 2 episodes of chest pain which she reports as tightness in 4 of 10 in intensity, with maybe a little bit of diaphoresis. When she had her first episode the other day he took one sublingual nitroglycerin glycerin which would provide relief after a few minutes. She had an episode this morning which resolved on its own. She also reports dyspnea on exertion when she walks on a hot day and she does a lot of walking apparently. She also reported one week where she had a few episodes on mild nausea.  The patient currently denies vomiting, fever, shortness of breath, orthopnea, dizziness, PND, cough, congestion, abdominal pain, hematochezia, melena, lower extremity edema, claudication.  She saw Huey Bienenstock [facility] in the office 04/29/13. She still complains of dyspnea on exertion and weekly chest pain as well as palpitations. I obtained a Myoview stress test which was entirely normal as was a 2-D echocardiogram.    Current Outpatient Prescriptions    Medication Sig Dispense Refill  . amiodarone (PACERONE) 200 MG tablet Take 1 tablet (200 mg total) by mouth daily.  30 tablet  6  . aspirin 325 MG EC tablet Take 325 mg by mouth daily.      Marland Kitchen atenolol (TENORMIN) 25 MG tablet Take 25 mg by mouth daily.      Marland Kitchen ipratropium (ATROVENT) 0.03 % nasal spray Place 1 spray into the nose as needed.      Marland Kitchen levothyroxine (SYNTHROID, LEVOTHROID) 100 MCG tablet Take 100 mcg by mouth daily before breakfast.      . Multiple Vitamin (MULTIVITAMIN) capsule Take 1 capsule by mouth daily.      . nitroGLYCERIN (NITROSTAT) 0.4 MG SL tablet Place 0.4 mg under the tongue every 5 (five) minutes as needed for chest pain.      . pantoprazole (PROTONIX) 40 MG tablet Take 40 mg by mouth daily.      Marland Kitchen zolpidem (AMBIEN CR) 12.5 MG CR tablet Take 12.5 mg by mouth every other day.       No current facility-administered medications for this visit.    Allergies  Allergen Reactions  . Nexium [Esomeprazole]   . Penicillins   . Statins   . Sulfa Antibiotics     History   Social History  . Marital Status: Married    Spouse Name: N/A    Number of Children: N/A  . Years of Education: N/A   Occupational History  . Not on file.   Social History Main Topics  . Smoking status: Never Smoker   . Smokeless tobacco: Not on  file  . Alcohol Use: No  . Drug Use: Not on file  . Sexual Activity: Not on file   Other Topics Concern  . Not on file   Social History Narrative  . No narrative on file     Review of Systems: General: negative for chills, fever, night sweats or weight changes.  Cardiovascular: negative for chest pain, dyspnea on exertion, edema, orthopnea, palpitations, paroxysmal nocturnal dyspnea or shortness of breath Dermatological: negative for rash Respiratory: negative for cough or wheezing Urologic: negative for hematuria Abdominal: negative for nausea, vomiting, diarrhea, bright red blood per rectum, melena, or hematemesis Neurologic: negative for  visual changes, syncope, or dizziness All other systems reviewed and are otherwise negative except as noted above.    Blood pressure 212/90, pulse 56, height 5\' 7"  (1.702 m), weight 160 lb (72.576 kg).  General appearance: alert and no distress Neck: no adenopathy, no carotid bruit, no JVD, supple, symmetrical, trachea midline and thyroid not enlarged, symmetric, no tenderness/mass/nodules Lungs: clear to auscultation bilaterally Heart: regular rate and rhythm, S1, S2 normal, no murmur, click, rub or gallop Extremities: extremities normal, atraumatic, no cyanosis or edema  EKG not performed today  ASSESSMENT AND PLAN:   Coronary artery disease status post multiple stents in the past with fatigue and pelvic surgery. She's had no recurrent chest pain. A recent Myoview stress test and 2-D echo were entirely normal.  Essential hypertension Her blood pressure is elevated today but she says she is under a lot of stress recently  Paroxysmal atrial fibrillation Maintaining sinus rhythm on amiodarone      Runell Gess MD [facility], [facility] 08/27/2013 11:58 AM

## 2013-08-27 NOTE — Assessment & Plan Note (Signed)
Her blood pressure is elevated today but she says she is under a lot of stress recently

## 2013-08-27 NOTE — Assessment & Plan Note (Signed)
status post multiple stents in the past with fatigue and pelvic surgery. She's had no recurrent chest pain. A recent Myoview stress test and 2-D echo were entirely normal.

## 2013-08-27 NOTE — Assessment & Plan Note (Signed)
Maintaining sinus rhythm on amiodarone 

## 2013-09-17 ENCOUNTER — Ambulatory Visit (INDEPENDENT_AMBULATORY_CARE_PROVIDER_SITE_OTHER): Payer: Medicare Other | Admitting: Gastroenterology

## 2013-09-17 ENCOUNTER — Encounter: Payer: Self-pay | Admitting: Gastroenterology

## 2013-09-17 VITALS — BP 160/88 | HR 60 | Ht 67.0 in | Wt 159.4 lb

## 2013-09-17 DIAGNOSIS — K589 Irritable bowel syndrome without diarrhea: Secondary | ICD-10-CM

## 2013-09-17 NOTE — Patient Instructions (Signed)
Please start taking citrucel (orange flavored) powder fiber supplement.  This may cause some bloating at first but that usually goes away. Begin with a small spoonful and work your way up to a large, heaping spoonful daily over a week. Call to report on your symptoms, response to fiber supplements in 4 weeks.

## 2013-09-17 NOTE — Progress Notes (Signed)
HPI: This is a  very pleasant 79 year old woman who is here to establish local GI care.   Moved from [place] about a year ago.  Has a grandson locally.  Happy with the move.  Diagnosed with IBS.  Most recent EGD 01/2011 (mild esophagitis) in [place].  [facility]  This showed diverticulosis, random biopsies were normal. No polyps or cancers.  Normally she will have BM, usually loose.  Sometimes will have multkple loose stools.  Sometimes just gas.  Recently serious constipation. Had to really strain to move bowels.  Still alternates a bit  Jaundiced as a child.  Has never tried fiber supplements.  Overall weight up a bit.  No overt bleeding.  Review of systems: Pertinent positive and negative review of systems were noted in the above HPI section. Complete review of systems was performed and was otherwise normal.    Past Medical History  Diagnosis Date  . Insomnia     chronic  . IBS (irritable bowel syndrome)   . HTN (hypertension)   . Dyslipidemia   . Family history of cardiovascular disease   . PAF (paroxysmal atrial fibrillation)     coumadin was stopped secondary to GI Bleed, on amiodarone; monitor march 2014-NSR  . CAD (coronary artery disease)     PCI x 2  . Hypothyroidism   . Chest pain   . Dyspnea on exertion     Past Surgical History  Procedure Laterality Date  . Coronary angioplasty with stent placement  01/05/2011    2.5x63mm Promus DES stent to RCA post dilated to 2.75x71mm   . Coronary angioplasty with stent placement  ?    3 stents per patient  . Appendectomy    . Cholecystectomy    . Abdominal hysterectomy      Current Outpatient Prescriptions  Medication Sig Dispense Refill  . amiodarone (PACERONE) 200 MG tablet Take 1 tablet (200 mg total) by mouth daily.  30 tablet  6  . aspirin 325 MG EC tablet Take 325 mg by mouth daily.      Marland Kitchen atenolol (TENORMIN) 25 MG tablet Take 25 mg by mouth daily.      Marland Kitchen ipratropium (ATROVENT) 0.03 % nasal spray  Place 1 spray into the nose as needed.      Marland Kitchen levothyroxine (SYNTHROID, LEVOTHROID) 100 MCG tablet Take 100 mcg by mouth daily before breakfast.      . Multiple Vitamin (MULTIVITAMIN) capsule Take 1 capsule by mouth daily.      . nitroGLYCERIN (NITROSTAT) 0.4 MG SL tablet Place 0.4 mg under the tongue every 5 (five) minutes as needed for chest pain.      . pantoprazole (PROTONIX) 40 MG tablet Take 40 mg by mouth daily.      . pravastatin (PRAVACHOL) 10 MG tablet       . zolpidem (AMBIEN CR) 12.5 MG CR tablet Take 12.5 mg by mouth every other day.       No current facility-administered medications for this visit.    Allergies as of 09/17/2013 - Review Complete 09/17/2013  Allergen Reaction Noted  . Morphine and related  09/17/2013  . Nexium [esomeprazole]  04/29/2013  . Penicillins  04/29/2013  . Statins  04/29/2013  . Sulfa antibiotics  04/29/2013    Family History  Problem Relation Age of Onset  . Diabetes Maternal Aunt   . Diabetes Son   . Heart disease      Both sides of family    History   Social  History  . Marital Status: Married    Spouse Name: N/A    Number of Children: 3  . Years of Education: N/A   Occupational History  . Sales/Office    Social History Main Topics  . Smoking status: Never Smoker   . Smokeless tobacco: Never Used  . Alcohol Use: No  . Drug Use: No  . Sexual Activity: Not on file   Other Topics Concern  . Not on file   Social History Narrative  . No narrative on file       Physical Exam: BP 160/88  Pulse 60  Ht 5\' 7"  (1.702 m)  Wt 159 lb 6.4 oz (72.303 kg)  BMI 24.96 kg/m2 Constitutional: generally well-appearing Psychiatric: alert and oriented x3 Eyes: extraocular movements intact Mouth: oral pharynx moist, no lesions Neck: supple no lymphadenopathy Cardiovascular: heart regular rate and rhythm Lungs: clear to auscultation bilaterally Abdomen: soft, nontender, nondistended, no obvious ascites, no peritoneal signs, normal  bowel sounds Extremities: no lower extremity edema bilaterally Skin: no lesions on visible extremities    Assessment and plan: 79 y.o. female with  diarrhea predominant IBS  She has had thorough gastrointestinal workup as recently as 2012. I see no further testing is needed. Like I fiber supplements for her loose stools. It isn't always effective and I generally try in the median first however she had constipation episode recently that really locked her up for several days in a bit reluctant to risk that again. She will call to report on her symptoms in 4 weeks and sooner if needed.

## 2013-09-18 ENCOUNTER — Other Ambulatory Visit: Payer: Self-pay | Admitting: Cardiovascular Disease

## 2013-09-18 NOTE — Telephone Encounter (Signed)
Rx was sent to pharmacy electronically. 

## 2013-10-30 DIAGNOSIS — J069 Acute upper respiratory infection, unspecified: Secondary | ICD-10-CM | POA: Diagnosis not present

## 2013-12-05 DIAGNOSIS — R059 Cough, unspecified: Secondary | ICD-10-CM | POA: Diagnosis not present

## 2013-12-05 DIAGNOSIS — R05 Cough: Secondary | ICD-10-CM | POA: Diagnosis not present

## 2014-02-21 ENCOUNTER — Emergency Department (HOSPITAL_BASED_OUTPATIENT_CLINIC_OR_DEPARTMENT_OTHER): Payer: Medicare Other

## 2014-02-21 ENCOUNTER — Emergency Department (HOSPITAL_BASED_OUTPATIENT_CLINIC_OR_DEPARTMENT_OTHER)
Admission: EM | Admit: 2014-02-21 | Discharge: 2014-02-22 | Disposition: A | Discharge status: Home / Self Care | Payer: Medicare Other | Attending: Emergency Medicine | Admitting: Emergency Medicine

## 2014-02-21 DIAGNOSIS — E039 Hypothyroidism, unspecified: Secondary | ICD-10-CM | POA: Diagnosis not present

## 2014-02-21 DIAGNOSIS — I2510 Atherosclerotic heart disease of native coronary artery without angina pectoris: Secondary | ICD-10-CM | POA: Diagnosis not present

## 2014-02-21 DIAGNOSIS — S93602A Unspecified sprain of left foot, initial encounter: Secondary | ICD-10-CM

## 2014-02-21 DIAGNOSIS — Z7982 Long term (current) use of aspirin: Secondary | ICD-10-CM | POA: Insufficient documentation

## 2014-02-21 DIAGNOSIS — Z79899 Other long term (current) drug therapy: Secondary | ICD-10-CM | POA: Insufficient documentation

## 2014-02-21 DIAGNOSIS — S93409A Sprain of unspecified ligament of unspecified ankle, initial encounter: Secondary | ICD-10-CM | POA: Diagnosis not present

## 2014-02-21 DIAGNOSIS — Z8719 Personal history of other diseases of the digestive system: Secondary | ICD-10-CM | POA: Diagnosis not present

## 2014-02-21 DIAGNOSIS — S93402A Sprain of unspecified ligament of left ankle, initial encounter: Secondary | ICD-10-CM

## 2014-02-21 DIAGNOSIS — Y929 Unspecified place or not applicable: Secondary | ICD-10-CM | POA: Insufficient documentation

## 2014-02-21 DIAGNOSIS — E785 Hyperlipidemia, unspecified: Secondary | ICD-10-CM | POA: Diagnosis not present

## 2014-02-21 DIAGNOSIS — S99919A Unspecified injury of unspecified ankle, initial encounter: Secondary | ICD-10-CM | POA: Diagnosis not present

## 2014-02-21 DIAGNOSIS — I4891 Unspecified atrial fibrillation: Secondary | ICD-10-CM | POA: Insufficient documentation

## 2014-02-21 DIAGNOSIS — M25579 Pain in unspecified ankle and joints of unspecified foot: Secondary | ICD-10-CM | POA: Diagnosis not present

## 2014-02-21 DIAGNOSIS — Z880 Allergy status to penicillin: Secondary | ICD-10-CM | POA: Insufficient documentation

## 2014-02-21 DIAGNOSIS — Z9861 Coronary angioplasty status: Secondary | ICD-10-CM | POA: Diagnosis not present

## 2014-02-21 DIAGNOSIS — Y9301 Activity, walking, marching and hiking: Secondary | ICD-10-CM | POA: Insufficient documentation

## 2014-02-21 DIAGNOSIS — X500XXA Overexertion from strenuous movement or load, initial encounter: Secondary | ICD-10-CM | POA: Insufficient documentation

## 2014-02-21 DIAGNOSIS — I10 Essential (primary) hypertension: Secondary | ICD-10-CM | POA: Diagnosis not present

## 2014-02-21 DIAGNOSIS — G4700 Insomnia, unspecified: Secondary | ICD-10-CM | POA: Insufficient documentation

## 2014-02-21 DIAGNOSIS — M79609 Pain in unspecified limb: Secondary | ICD-10-CM | POA: Diagnosis not present

## 2014-02-21 DIAGNOSIS — S8990XA Unspecified injury of unspecified lower leg, initial encounter: Secondary | ICD-10-CM | POA: Diagnosis not present

## 2014-02-21 NOTE — ED Notes (Signed)
Pt reports while walking turned over her left ankle and is now c/o foot and ankle pain + edema and bruising noted

## 2014-02-21 NOTE — Discharge Instructions (Signed)
Your caregiver has diagnosed you as suffering from an ankle sprain. Ankle sprain occurs when the ligaments that hold the ankle joint together are stretched or torn. It may take 4 to 6 weeks to heal. For Activity: Use walker with non-weight bearing for the first few days. Then, you may walk on your ankle as the pain allows, or as instructed. Start gradually with weight bearing on the affected ankle. If you cannot walk without crutches or walker in one week, you need a re-check. SEEK IMMEDIATE MEDICAL ATTENTION IF: your toes are numb or tingling, appear gray or blue, or you have severe pain (also elevate leg and loosen splint).  Use your walker for safety and splint left ankle.

## 2014-02-21 NOTE — ED Provider Notes (Signed)
CSN: [id number]     Arrival date & time 02/21/14  2008 History  This chart was scribed for Jeanne Relic, MD by Marcha Dutton, ED Scribe. This patient was seen in room MH09/MH09 and the patient's care was started at 10:10 PM.    Chief Complaint  Patient presents with  . Ankle Pain    r/t fall    The history is provided by the patient. No language interpreter was used.    HPI Comments: Jeanne Hart is a 80 y.o. female who presents to the Emergency Department complaining of gradually worsening, constant left ankle pain after twisting her left ankle while walking earlier this evening. Pt denies abdominal pain, hip pain, back pain, neck pain, nausea, vomiting, and diarrhea. Pt reports she has put ice on her left foot which has helped some to alleviate the pain. Her pain worsens with movement and weightbearing.     Past Medical History  Diagnosis Date  . Insomnia     chronic  . IBS (irritable bowel syndrome)   . HTN (hypertension)   . Dyslipidemia   . Family history of cardiovascular disease   . PAF (paroxysmal atrial fibrillation)     coumadin was stopped secondary to GI Bleed, on amiodarone; monitor march 2014-NSR  . CAD (coronary artery disease)     PCI x 2  . Hypothyroidism   . Chest pain   . Dyspnea on exertion     Past Surgical History  Procedure Laterality Date  . Coronary angioplasty with stent placement  01/05/2011    2.5x48mm Promus DES stent to RCA post dilated to 2.75x93mm   . Coronary angioplasty with stent placement  ?    3 stents per patient  . Appendectomy    . Cholecystectomy    . Abdominal hysterectomy      Family History  Problem Relation Age of Onset  . Diabetes Maternal Aunt   . Diabetes Son   . Heart disease      Both sides of family    History  Substance Use Topics  . Smoking status: Never Smoker   . Smokeless tobacco: Never Used  . Alcohol Use: No    OB History   Grav Para Term Preterm Abortions TAB SAB Ect Mult Living                   Review of Systems 10 Systems reviewed and all are negative for acute change except as noted in the HPI.   Allergies  Morphine and related; Nexium; Penicillins; Statins; and Sulfa antibiotics  Home Medications   Prior to Admission medications   Medication Sig Start Date End Date Taking? Authorizing Provider  amiodarone (PACERONE) 200 MG tablet TAKE ONE TABLET BY MOUTH DAILY 09/18/13   Lorretta Harp, MD  aspirin 325 MG EC tablet Take 325 mg by mouth daily.    Historical Provider, MD  atenolol (TENORMIN) 25 MG tablet Take 25 mg by mouth daily.    Historical Provider, MD  ipratropium (ATROVENT) 0.03 % nasal spray Place 1 spray into the nose as needed. 05/19/13   Historical Provider, MD  levothyroxine (SYNTHROID, LEVOTHROID) 100 MCG tablet Take 100 mcg by mouth daily before breakfast.    Historical Provider, MD  Multiple Vitamin (MULTIVITAMIN) capsule Take 1 capsule by mouth daily.    Historical Provider, MD  nitroGLYCERIN (NITROSTAT) 0.4 MG SL tablet Place 0.4 mg under the tongue every 5 (five) minutes as needed for chest pain.    Historical  Provider, MD  pantoprazole (PROTONIX) 40 MG tablet Take 40 mg by mouth daily.    Historical Provider, MD  pravastatin (PRAVACHOL) 10 MG tablet  09/11/13   Historical Provider, MD  zolpidem (AMBIEN CR) 12.5 MG CR tablet Take 12.5 mg by mouth every other day.    Historical Provider, MD    TriageVitals: BP 209/70  Pulse 67  Temp(Src) 98.2 F (36.8 C) (Oral)  Ht 5' 6.5" (1.689 m)  Wt 150 lb (68.04 kg)  BMI 23.85 kg/m2  SpO2 99%   Physical Exam  Nursing note and vitals reviewed. Constitutional: She is oriented to person, place, and time.  Awake, alert, nontoxic appearance.  HENT:  Head: Atraumatic.  Eyes: Right eye exhibits no discharge. Left eye exhibits no discharge.  Neck: Neck supple.  Pulmonary/Chest: Effort normal. She exhibits no tenderness.  Abdominal: Soft. There is no tenderness. There is no rebound.  Musculoskeletal: She  exhibits no tenderness.  Baseline ROM, no obvious new focal weakness. Right  Non tender arms and right leg, non tender left hip, thigh, left knee, and proximal tibia.Left foot DP pulse intact, CR <2s to all toes, left foot normal light touch, able to plantar flex and dorsi flex all toes. Left ankle has non tender achilles tendon, minimal medial tenderness, left lateral ankle moderate tenderness, mild tenderness left lateral foot.  Neurological: She is alert and oriented to person, place, and time.  Mental status and motor strength appears baseline for patient and situation.  Skin: Skin is warm and dry. No rash noted.  Psychiatric: She has a normal mood and affect. Her behavior is normal.    ED Course  Procedures (including critical care time)   ]DIAGNOSTIC STUDIES: Oxygen Saturation is 99% on RA, normal by my interpretation.     COORDINATION OF CARE: 10:17 PM- Patient understands and agrees with initial ED impression and plan with expectations set for ED visit.  Patient / Family / Caregiver informed of clinical course, understand medical decision-making process, and agree with plan. Labs Review Labs Reviewed - No data to display   Imaging Review Dg Ankle Complete Left  02/21/2014   CLINICAL DATA:  Injury of left ankle with ankle pain and swelling  EXAM: LEFT ANKLE COMPLETE - 3+ VIEW  COMPARISON:  None.  FINDINGS: There is no evidence of fracture, dislocation, or joint effusion. There is plantar calcaneal spur. Minimal soft tissue swelling around the ankle.  IMPRESSION: No acute fracture or dislocation.   Electronically Signed   By: Abelardo Diesel M.D.   On: 02/21/2014 21:23   Dg Foot Complete Left  02/21/2014   CLINICAL DATA:  Left foot and ankle pain following injury.  EXAM: LEFT FOOT - COMPLETE 3+ VIEW  COMPARISON:  None.  FINDINGS: There is a hallux valgus contour of the first ray. There are mild degenerative changes at the first metatarsophalangeal joint. The phalanges appear intact.  The metatarsals also appear intact. The bones of the hindfoot exhibit no acute abnormalities. There is a plantar calcaneal spur. Very mild soft tissue swelling over the midfoot is present.  IMPRESSION: There are chronic findings but there is no evidence of an acute fracture nor dislocation of the bones of the left foot.   Electronically Signed   By: David  Martinique   On: 02/21/2014 23:27     EKG Interpretation None      MDM   Final diagnoses:  Left ankle sprain  Sprain of left foot    I doubt any other EMC precluding  discharge at this time including, but not necessarily limited to the following:fracture.  I personally performed the services described in this documentation, which was scribed in my presence. The recorded information has been reviewed and is accurate.    Jeanne Relic, MD 02/23/14 [phone]

## 2014-03-12 DIAGNOSIS — M899 Disorder of bone, unspecified: Secondary | ICD-10-CM | POA: Diagnosis not present

## 2014-03-12 DIAGNOSIS — E785 Hyperlipidemia, unspecified: Secondary | ICD-10-CM | POA: Diagnosis not present

## 2014-03-12 DIAGNOSIS — I2510 Atherosclerotic heart disease of native coronary artery without angina pectoris: Secondary | ICD-10-CM | POA: Diagnosis not present

## 2014-03-12 DIAGNOSIS — Z0000 Encounter for general adult medical examination without abnormal findings: Secondary | ICD-10-CM | POA: Diagnosis not present

## 2014-03-12 DIAGNOSIS — I10 Essential (primary) hypertension: Secondary | ICD-10-CM | POA: Diagnosis not present

## 2014-03-12 DIAGNOSIS — E039 Hypothyroidism, unspecified: Secondary | ICD-10-CM | POA: Diagnosis not present

## 2014-03-12 DIAGNOSIS — Z1331 Encounter for screening for depression: Secondary | ICD-10-CM | POA: Diagnosis not present

## 2014-03-12 DIAGNOSIS — E663 Overweight: Secondary | ICD-10-CM | POA: Diagnosis not present

## 2014-03-12 DIAGNOSIS — M949 Disorder of cartilage, unspecified: Secondary | ICD-10-CM | POA: Diagnosis not present

## 2014-03-23 ENCOUNTER — Inpatient Hospital Stay (HOSPITAL_COMMUNITY)
Admission: EM | Admit: 2014-03-23 | Discharge: 2014-03-25 | DRG: 302 | Disposition: A | Discharge status: Home / Self Care | Payer: Medicare Other | Attending: Family Medicine | Admitting: Family Medicine

## 2014-03-23 ENCOUNTER — Emergency Department (HOSPITAL_COMMUNITY): Payer: Medicare Other

## 2014-03-23 ENCOUNTER — Encounter (HOSPITAL_COMMUNITY): Payer: Self-pay | Admitting: Emergency Medicine

## 2014-03-23 DIAGNOSIS — N189 Chronic kidney disease, unspecified: Secondary | ICD-10-CM | POA: Diagnosis present

## 2014-03-23 DIAGNOSIS — Z7982 Long term (current) use of aspirin: Secondary | ICD-10-CM | POA: Diagnosis not present

## 2014-03-23 DIAGNOSIS — N390 Urinary tract infection, site not specified: Secondary | ICD-10-CM | POA: Diagnosis present

## 2014-03-23 DIAGNOSIS — E039 Hypothyroidism, unspecified: Secondary | ICD-10-CM | POA: Diagnosis present

## 2014-03-23 DIAGNOSIS — D649 Anemia, unspecified: Secondary | ICD-10-CM | POA: Diagnosis not present

## 2014-03-23 DIAGNOSIS — Z79899 Other long term (current) drug therapy: Secondary | ICD-10-CM

## 2014-03-23 DIAGNOSIS — N179 Acute kidney failure, unspecified: Secondary | ICD-10-CM | POA: Diagnosis not present

## 2014-03-23 DIAGNOSIS — R002 Palpitations: Secondary | ICD-10-CM

## 2014-03-23 DIAGNOSIS — I5033 Acute on chronic diastolic (congestive) heart failure: Secondary | ICD-10-CM | POA: Diagnosis not present

## 2014-03-23 DIAGNOSIS — Z888 Allergy status to other drugs, medicaments and biological substances status: Secondary | ICD-10-CM

## 2014-03-23 DIAGNOSIS — Z885 Allergy status to narcotic agent status: Secondary | ICD-10-CM | POA: Diagnosis not present

## 2014-03-23 DIAGNOSIS — E785 Hyperlipidemia, unspecified: Secondary | ICD-10-CM | POA: Diagnosis not present

## 2014-03-23 DIAGNOSIS — K219 Gastro-esophageal reflux disease without esophagitis: Secondary | ICD-10-CM | POA: Diagnosis present

## 2014-03-23 DIAGNOSIS — K589 Irritable bowel syndrome without diarrhea: Secondary | ICD-10-CM | POA: Diagnosis present

## 2014-03-23 DIAGNOSIS — I498 Other specified cardiac arrhythmias: Secondary | ICD-10-CM | POA: Diagnosis present

## 2014-03-23 DIAGNOSIS — R079 Chest pain, unspecified: Secondary | ICD-10-CM | POA: Diagnosis not present

## 2014-03-23 DIAGNOSIS — K449 Diaphragmatic hernia without obstruction or gangrene: Secondary | ICD-10-CM | POA: Diagnosis present

## 2014-03-23 DIAGNOSIS — I4891 Unspecified atrial fibrillation: Secondary | ICD-10-CM | POA: Diagnosis present

## 2014-03-23 DIAGNOSIS — Z882 Allergy status to sulfonamides status: Secondary | ICD-10-CM | POA: Diagnosis not present

## 2014-03-23 DIAGNOSIS — G478 Other sleep disorders: Secondary | ICD-10-CM | POA: Diagnosis present

## 2014-03-23 DIAGNOSIS — I200 Unstable angina: Secondary | ICD-10-CM | POA: Diagnosis present

## 2014-03-23 DIAGNOSIS — Z8679 Personal history of other diseases of the circulatory system: Secondary | ICD-10-CM | POA: Diagnosis present

## 2014-03-23 DIAGNOSIS — Z833 Family history of diabetes mellitus: Secondary | ICD-10-CM

## 2014-03-23 DIAGNOSIS — Z8719 Personal history of other diseases of the digestive system: Secondary | ICD-10-CM

## 2014-03-23 DIAGNOSIS — I10 Essential (primary) hypertension: Secondary | ICD-10-CM

## 2014-03-23 DIAGNOSIS — I2789 Other specified pulmonary heart diseases: Secondary | ICD-10-CM | POA: Diagnosis present

## 2014-03-23 DIAGNOSIS — I129 Hypertensive chronic kidney disease with stage 1 through stage 4 chronic kidney disease, or unspecified chronic kidney disease: Secondary | ICD-10-CM | POA: Diagnosis present

## 2014-03-23 DIAGNOSIS — I2510 Atherosclerotic heart disease of native coronary artery without angina pectoris: Principal | ICD-10-CM | POA: Diagnosis present

## 2014-03-23 DIAGNOSIS — Z880 Allergy status to penicillin: Secondary | ICD-10-CM

## 2014-03-23 DIAGNOSIS — I160 Hypertensive urgency: Secondary | ICD-10-CM | POA: Diagnosis present

## 2014-03-23 DIAGNOSIS — I480 Paroxysmal atrial fibrillation: Secondary | ICD-10-CM | POA: Diagnosis present

## 2014-03-23 DIAGNOSIS — Z9861 Coronary angioplasty status: Secondary | ICD-10-CM

## 2014-03-23 DIAGNOSIS — Z9089 Acquired absence of other organs: Secondary | ICD-10-CM

## 2014-03-23 DIAGNOSIS — Z8249 Family history of ischemic heart disease and other diseases of the circulatory system: Secondary | ICD-10-CM

## 2014-03-23 DIAGNOSIS — R071 Chest pain on breathing: Secondary | ICD-10-CM | POA: Diagnosis not present

## 2014-03-23 HISTORY — DX: Gastro-esophageal reflux disease without esophagitis: K21.9

## 2014-03-23 HISTORY — DX: Diverticulitis of intestine, part unspecified, without perforation or abscess without bleeding: K57.92

## 2014-03-23 LAB — CBC
HEMATOCRIT: 29.8 % — AB (ref 36.0–46.0)
Hemoglobin: 9.4 g/dL — ABNORMAL LOW (ref 12.0–15.0)
MCH: 26.9 pg (ref 26.0–34.0)
MCHC: 31.5 g/dL (ref 30.0–36.0)
MCV: 85.1 fL (ref 78.0–100.0)
Platelets: 165 10*3/uL (ref 150–400)
RBC: 3.5 MIL/uL — ABNORMAL LOW (ref 3.87–5.11)
RDW: 16.4 % — ABNORMAL HIGH (ref 11.5–15.5)
WBC: 4.6 10*3/uL (ref 4.0–10.5)

## 2014-03-23 LAB — POC OCCULT BLOOD, ED: FECAL OCCULT BLD: POSITIVE — AB

## 2014-03-23 LAB — PROTIME-INR
INR: 0.97 (ref 0.00–1.49)
PROTHROMBIN TIME: 12.7 s (ref 11.6–15.2)

## 2014-03-23 LAB — BASIC METABOLIC PANEL
BUN: 24 mg/dL — AB (ref 6–23)
CO2: 21 mEq/L (ref 19–32)
CREATININE: 1.26 mg/dL — AB (ref 0.50–1.10)
Calcium: 9.4 mg/dL (ref 8.4–10.5)
Chloride: 104 mEq/L (ref 96–112)
GFR, EST AFRICAN AMERICAN: 44 mL/min — AB (ref >90)
GFR, EST NON AFRICAN AMERICAN: 38 mL/min — AB (ref >90)
GLUCOSE: 114 mg/dL — AB (ref 70–99)
POTASSIUM: 4.3 meq/L (ref 3.7–5.3)
Sodium: 138 mEq/L (ref 137–147)

## 2014-03-23 LAB — MRSA PCR SCREENING: MRSA BY PCR: NEGATIVE

## 2014-03-23 LAB — I-STAT TROPONIN, ED: Troponin i, poc: 0 ng/mL (ref 0.00–0.08)

## 2014-03-23 LAB — PRO B NATRIURETIC PEPTIDE: Pro B Natriuretic peptide (BNP): 1245 pg/mL — ABNORMAL HIGH (ref 0–450)

## 2014-03-23 MED ORDER — SIMVASTATIN 5 MG PO TABS: 5.0000 mg | ORAL_TABLET | Freq: Every day | ORAL | Status: DC

## 2014-03-23 MED ORDER — FUROSEMIDE 40 MG PO TABS
20.0000 mg | ORAL_TABLET | Freq: Once | ORAL | Status: AC
  Administered 2014-03-23: 20 mg via ORAL
  Filled 2014-03-23: qty 1

## 2014-03-23 MED ORDER — ENOXAPARIN SODIUM 40 MG/0.4ML ~~LOC~~ SOLN: 40.0000 mg | SUBCUTANEOUS | Status: DC

## 2014-03-23 MED ORDER — MULTIVITAMINS PO CAPS: 1.0000 | ORAL_CAPSULE | Freq: Every day | ORAL | Status: DC

## 2014-03-23 MED ORDER — ASPIRIN EC 325 MG PO TBEC
325.0000 mg | DELAYED_RELEASE_TABLET | Freq: Every day | ORAL | Status: DC
  Administered 2014-03-24 – 2014-03-25 (×2): 325 mg via ORAL
  Filled 2014-03-23 (×2): qty 1

## 2014-03-23 MED ORDER — VITAMIN D3 25 MCG (1000 UNIT) PO TABS
1000.0000 [IU] | ORAL_TABLET | Freq: Every day | ORAL | Status: DC
  Administered 2014-03-23 – 2014-03-25 (×3): 1000 [IU] via ORAL
  Filled 2014-03-23 (×3): qty 1

## 2014-03-23 MED ORDER — NITROGLYCERIN 0.4 MG SL SUBL
0.4000 mg | SUBLINGUAL_TABLET | SUBLINGUAL | Status: DC | PRN
  Administered 2014-03-23 (×2): 0.4 mg via SUBLINGUAL
  Filled 2014-03-23: qty 1

## 2014-03-23 MED ORDER — METOPROLOL TARTRATE 25 MG PO TABS: 50.0000 mg | ORAL_TABLET | Freq: Two times a day (BID) | ORAL | Status: DC

## 2014-03-23 MED ORDER — ENOXAPARIN SODIUM 40 MG/0.4ML ~~LOC~~ SOLN
40.0000 mg | SUBCUTANEOUS | Status: DC
  Administered 2014-03-23: 40 mg via SUBCUTANEOUS
  Filled 2014-03-23: qty 0.4

## 2014-03-23 MED ORDER — LEVOTHYROXINE SODIUM 100 MCG PO TABS
100.0000 ug | ORAL_TABLET | Freq: Every day | ORAL | Status: DC
  Administered 2014-03-24 – 2014-03-25 (×2): 100 ug via ORAL
  Filled 2014-03-23 (×3): qty 1

## 2014-03-23 MED ORDER — AMLODIPINE BESYLATE 5 MG PO TABS: 2.5000 mg | ORAL_TABLET | Freq: Every day | ORAL | Status: DC

## 2014-03-23 MED ORDER — PRAVASTATIN SODIUM 10 MG PO TABS
10.0000 mg | ORAL_TABLET | Freq: Every day | ORAL | Status: DC
  Administered 2014-03-24: 10 mg via ORAL
  Filled 2014-03-23 (×2): qty 1

## 2014-03-23 MED ORDER — AMIODARONE HCL 200 MG PO TABS
200.0000 mg | ORAL_TABLET | Freq: Every day | ORAL | Status: DC
  Administered 2014-03-24 – 2014-03-25 (×2): 200 mg via ORAL
  Filled 2014-03-23 (×2): qty 1

## 2014-03-23 MED ORDER — VITAMIN C 250 MG PO TABS
250.0000 mg | ORAL_TABLET | Freq: Every day | ORAL | Status: DC
  Administered 2014-03-23 – 2014-03-25 (×3): 250 mg via ORAL
  Filled 2014-03-23 (×3): qty 1

## 2014-03-23 MED ORDER — NITROGLYCERIN 2 % TD OINT
0.5000 [in_us] | TOPICAL_OINTMENT | Freq: Four times a day (QID) | TRANSDERMAL | Status: DC
  Administered 2014-03-23: 0.5 [in_us] via TOPICAL
  Filled 2014-03-23: qty 30

## 2014-03-23 MED ORDER — HYDRALAZINE HCL 20 MG/ML IJ SOLN
10.0000 mg | Freq: Four times a day (QID) | INTRAMUSCULAR | Status: DC | PRN
  Filled 2014-03-23: qty 0.5

## 2014-03-23 MED ORDER — ONDANSETRON HCL 4 MG/2ML IJ SOLN: 4.0000 mg | Freq: Four times a day (QID) | INTRAMUSCULAR | Status: DC | PRN

## 2014-03-23 MED ORDER — ASPIRIN 325 MG PO TABS: 325.0000 mg | ORAL_TABLET | ORAL | Status: DC

## 2014-03-23 MED ORDER — ACETAMINOPHEN 325 MG PO TABS: 650.0000 mg | ORAL_TABLET | ORAL | Status: DC | PRN

## 2014-03-23 MED ORDER — ADULT MULTIVITAMIN W/MINERALS CH
1.0000 | ORAL_TABLET | Freq: Every day | ORAL | Status: DC
  Administered 2014-03-23 – 2014-03-25 (×3): 1 via ORAL
  Filled 2014-03-23 (×3): qty 1

## 2014-03-23 MED ORDER — NITROGLYCERIN IN D5W 200-5 MCG/ML-% IV SOLN
2.0000 ug/min | INTRAVENOUS | Status: DC
  Administered 2014-03-23: 5 ug/min via INTRAVENOUS
  Filled 2014-03-23: qty 250

## 2014-03-23 MED ORDER — ATENOLOL 25 MG PO TABS
25.0000 mg | ORAL_TABLET | Freq: Once | ORAL | Status: DC
  Filled 2014-03-23: qty 1

## 2014-03-23 MED ORDER — IPRATROPIUM BROMIDE 0.03 % NA SOLN
1.0000 | NASAL | Status: DC | PRN
  Filled 2014-03-23: qty 30

## 2014-03-23 MED ORDER — ISOSORBIDE MONONITRATE ER 30 MG PO TB24
30.0000 mg | ORAL_TABLET | Freq: Every day | ORAL | Status: DC
  Administered 2014-03-23: 30 mg via ORAL
  Administered 2014-03-24: 12:00:00 via ORAL
  Administered 2014-03-25: 30 mg via ORAL
  Filled 2014-03-23 (×3): qty 1

## 2014-03-23 MED ORDER — PANTOPRAZOLE SODIUM 40 MG PO TBEC: 40.0000 mg | DELAYED_RELEASE_TABLET | Freq: Every day | ORAL | Status: DC | PRN

## 2014-03-23 NOTE — ED Provider Notes (Signed)
CSN: [id number]     Arrival date & time 03/23/14  1611 History   First MD Initiated Contact with Patient 03/23/14 1626     Chief Complaint  Patient presents with  . Chest Pain   HPI Comments: Patient is an 80 y.o. Female who presents with 2 week history of intermittent chest pain.  Patient states that she is having 7/10 left sided crushing chest pain that radiates the the left shoulder and the LUQ.  Patient states that she was at rest when she started to develop the pain.  She tried taking half of a baby aspirin on top of her regular dose of adult aspirin with no relief.  Chest pain is associated with SOB which started prior to the chest pain and has been gradually worsening.  She admits to sleeping on 3 pillows at night.  She denies PND, swelling in her legs, nausea, vomiting, diaphoresis, changes in bowel habits, melena, hematochezia, or urinary symptoms.  Patient has a PMH of 4 cardiac stents.  Her last echo and stress test were performed in 2014 and were both unremarkable at that time.      Patient is a 80 y.o. female presenting with chest pain.  Chest Pain Associated symptoms: shortness of breath   Associated symptoms: no abdominal pain, no back pain, no cough, no diaphoresis, no dizziness, no dysphagia, no fatigue, no fever, no headache, no nausea, no numbness, no palpitations, not vomiting and no weakness     Past Medical History  Diagnosis Date  . Insomnia     chronic  . IBS (irritable bowel syndrome)   . HTN (hypertension)   . Dyslipidemia   . Family history of cardiovascular disease   . PAF (paroxysmal atrial fibrillation)     coumadin was stopped secondary to GI Bleed, on amiodarone; monitor march 2014-NSR  . CAD (coronary artery disease)     PCI x 2  . Hypothyroidism   . Chest pain   . Dyspnea on exertion   . GERD (gastroesophageal reflux disease)   . Diverticulitis    Past Surgical History  Procedure Laterality Date  . Coronary angioplasty with stent placement   01/05/2011    2.5x64mm Promus DES stent to RCA post dilated to 2.75x42mm   . Coronary angioplasty with stent placement  ?    3 stents per patient  . Appendectomy    . Cholecystectomy    . Abdominal hysterectomy     Family History  Problem Relation Age of Onset  . Diabetes Maternal Aunt   . Diabetes Son   . Heart disease      Both sides of family   History  Substance Use Topics  . Smoking status: Never Smoker   . Smokeless tobacco: Never Used  . Alcohol Use: No   OB History   Grav Para Term Preterm Abortions TAB SAB Ect Mult Living                 Review of Systems  Constitutional: Negative for fever, chills, diaphoresis, activity change, appetite change and fatigue.  HENT: Negative for trouble swallowing.   Respiratory: Positive for shortness of breath. Negative for cough, chest tightness and wheezing.   Cardiovascular: Positive for chest pain. Negative for palpitations and leg swelling.  Gastrointestinal: Negative for nausea, vomiting, abdominal pain, diarrhea, constipation, blood in stool and abdominal distention.  Genitourinary: Negative for dysuria, urgency, frequency, hematuria, flank pain and difficulty urinating.  Musculoskeletal: Negative for back pain.  Skin: Negative for rash.  Neurological: Negative for dizziness, weakness, light-headedness, numbness and headaches.  All other systems reviewed and are negative.     Allergies  Codeine; Morphine and related; Nexium; Penicillins; Statins; and Sulfa antibiotics  Home Medications   Prior to Admission medications   Medication Sig Start Date End Date Taking? Authorizing Provider  amiodarone (PACERONE) 200 MG tablet Take 200 mg by mouth daily.   Yes Historical Provider, MD  Ascorbic Acid (VITAMIN C PO) Take by mouth.   Yes Historical Provider, MD  aspirin 325 MG EC tablet Take 325 mg by mouth daily.   Yes Historical Provider, MD  atenolol (TENORMIN) 25 MG tablet Take 25 mg by mouth daily.   Yes Historical Provider,  MD  Cholecalciferol (VITAMIN D PO) Take by mouth.   Yes Historical Provider, MD  ipratropium (ATROVENT) 0.03 % nasal spray Place 1 spray into the nose as needed for rhinitis.  05/19/13  Yes Historical Provider, MD  levothyroxine (SYNTHROID, LEVOTHROID) 100 MCG tablet Take 100 mcg by mouth daily before breakfast.   Yes Historical Provider, MD  Multiple Vitamin (MULTIVITAMIN) capsule Take 1 capsule by mouth daily.   Yes Historical Provider, MD  nitroGLYCERIN (NITROSTAT) 0.4 MG SL tablet Place 0.4 mg under the tongue every 5 (five) minutes as needed for chest pain.   Yes Historical Provider, MD  pantoprazole (PROTONIX) 40 MG tablet Take 40 mg by mouth daily as needed (reflux.).    Yes Historical Provider, MD  pravastatin (PRAVACHOL) 10 MG tablet Take 10 mg by mouth daily.  09/11/13  Yes Historical Provider, MD  zolpidem (AMBIEN CR) 12.5 MG CR tablet Take 12.5 mg by mouth every other day.   Yes Historical Provider, MD   BP 199/74  Pulse 53  Temp(Src) 98.2 F (36.8 C) (Oral)  Resp 17  Ht 5\' 7"  (1.702 m)  Wt 154 lb (69.854 kg)  BMI 24.11 kg/m2  SpO2 99% Physical Exam  Nursing note and vitals reviewed. Constitutional: She is oriented to person, place, and time. She appears well-developed and well-nourished. No distress.  HENT:  Head: Normocephalic and atraumatic.  Mouth/Throat: Oropharynx is clear and moist. No oropharyngeal exudate.  Eyes: Conjunctivae and EOM are normal. Pupils are equal, round, and reactive to light. No scleral icterus.  Neck: Normal range of motion. Neck supple. No JVD present.  Cardiovascular: Normal rate, regular rhythm, normal heart sounds and intact distal pulses.  Exam reveals no gallop and no friction rub.   No murmur heard. Pulmonary/Chest: Effort normal and breath sounds normal. No accessory muscle usage or stridor. No respiratory distress. She has no wheezes. She has no rales. She exhibits no tenderness.  Abdominal: Soft. Bowel sounds are normal. She exhibits no  distension and no mass. There is no tenderness. There is no rebound and no guarding.  Musculoskeletal: Normal range of motion. She exhibits no edema and no tenderness.  Lymphadenopathy:    She has no cervical adenopathy.  Neurological: She is alert and oriented to person, place, and time. No cranial nerve deficit. Coordination normal.  Skin: Skin is warm. No rash noted. She is not diaphoretic. No erythema. No pallor.  Psychiatric: She has a normal mood and affect. Her behavior is normal. Judgment and thought content normal.    ED Course  Procedures (including critical care time) Labs Review Labs Reviewed  CBC - Abnormal; Notable for the following:    RBC 3.50 (*)    Hemoglobin 9.4 (*)    HCT 29.8 (*)    RDW 16.4 (*)  All other components within normal limits  BASIC METABOLIC PANEL - Abnormal; Notable for the following:    Glucose, Bld 114 (*)    BUN 24 (*)    Creatinine, Ser 1.26 (*)    GFR calc non Af Amer 38 (*)    GFR calc Af Amer 44 (*)    All other components within normal limits  PRO B NATRIURETIC PEPTIDE - Abnormal; Notable for the following:    Pro B Natriuretic peptide (BNP) 1245.0 (*)    All other components within normal limits  POC OCCULT BLOOD, ED - Abnormal; Notable for the following:    Fecal Occult Bld POSITIVE (*)    All other components within normal limits  PROTIME-INR  Randolm Idol, ED    Imaging Review Dg Chest Port 1 View  03/23/2014   CLINICAL DATA:  Chest pain.  EXAM: PORTABLE CHEST - 1 VIEW  COMPARISON:  None.  FINDINGS: The lungs are clear. Heart size is mildly enlarged. No pneumothorax or pleural effusion. No focal bony abnormality.  IMPRESSION: Cardiomegaly without acute disease.   Electronically Signed   By: Inge Rise M.D.   On: 03/23/2014 17:06     EKG Interpretation   Date/Time:  Monday March 23 2014 16:18:13 EDT Ventricular Rate:  67 PR Interval:  187 QRS Duration: 100 QT Interval:  477 QTC Calculation: 504 R Axis:    42 Text Interpretation:  Sinus rhythm Prolonged QT interval Baseline wander  in lead(s) II aVF Confirmed by WARD,  DO, KRISTEN ([id number]) on 03/23/2014  4:19:52 PM      MDM   Final diagnoses:  Unstable angina   Patient presents to the [facility] ED with 2 weeks of intermittent chest pain associated with shortness of breath.  First troponin in the ED was negative.  Delta troponin is pending at this time.  Patient is anemic with Hgb of 9.4 and BNP elevated at this time to 1245.  Patient is hemoccult positive in the ED.  While here the patient became hypertensive with multiple readings of 200/80 or higher.  Dr. Winfred Leeds and myself were notified of these readings.  Patient's chest pain was not relieved with aspirin or with NTG paste.  Patient was started on NTG drip here in the ED.  Dr. Winfred Leeds consulted the oncall cardiologist who felt that the patient should be admitted to the hospital at this time.  Dr. Delena Serve was consulted and felt that the patient should be admitted to [facility] unit at this time for unstable angina and further workup from a cardiologic standpoint.  We have not started heparin at this time due to hemoccult positive stool and anemia.      Kenard Gower, PA-C 03/23/14 1954

## 2014-03-23 NOTE — H&P (Addendum)
[facility] History and Physical  Jeanne Hart UJW:[id number] DOB: 08-22-1927 DOA: 03/23/2014  Referring physician: Dr Georgiann Hahn PCP: Thayer Headings, MD   Primary cardiologist: Dr Allyson Sabal  Chief Complaint: chest pain since 1 day  HPI:  80 year old female with past medical hx of coronary artery disease status post 4 stents placed in past, hypertension, paroxysmal A. fib on amiodarone, hypothyroidism, dyslipidemia, strong family history of coronary artery disease, anemia, chronic angina who presented to the ED with chest pain since this morning. Patient reports doing her household chores when she started having chest pressure on her left side and underneath the left breast which was 6/10 in intensity and nonradiating. The chest pain was persistent. He should and did not take any nitroglycerin and she had run out of it. She reports feeling nauseous but denied any vomiting. The pain was persistent and lasted onto she came to the ED. Patient reports off and on chest pain with underlying angina. She is able to ambulate in and around the house and chronically uses 3 pillows for sleep. She denies any worsening shortness of breath or leg edema. Patient denies headache, dizziness, fever, chills, vomiting,  palpitations, SOB, abdominal pain, bowel or urinary symptoms. Denies change in weight or appetite. Denies any change in her medications recently. She reports taking her aspirin and blood pressure medications today.  Course in the ED Had accelerated hypertension with blood pressure of 205/74 mmHg. Pulse was ranging from 53-65, afebrile and normal O2 sat on room air. Blood work done showed hemoglobin of 9.4, hematocrit of 29.8 and platelets of 165. No baseline hemoglobin in the system. Chemistry showed sodium of 138, K. of 4.3, chloride 104, CO2 of 21, BUN of 24, creatinine 1.26, calcium of 9.4 and glucose of 114. Chest x-ray was unremarkable except for cardiomegaly.  proBNP was 1200. EKG showed sinus  rhythm with QTC of 514. Initial troponin was negative. Patient given 25 mg atenolol followed by 20 mg Lasix and given persistently elevated BP ordered for nitroglycerin drip. Patient still reported chest pressure of 5/10 but symptomatically feeling better. [facility] called for admission to step down for closer monitoring. Cardiology Dr. Delton See was consulted from the ED.Marland Kitchen stool for occult blood checked in the ED was positive.  Review of Systems:  Constitutional: Denies fever, chills, diaphoresis, appetite change and fatigue.  HEENT: Denies photophobia, eye pain,ear pain, congestion, sore throat, rhinorrhea,trouble swallowing, neck pain, Respiratory: Denies SOB, DOE, cough,   and wheezing.   Cardiovascular:  chest pain, denies palpitations and leg swelling.  Gastrointestinal: Denies nausea, vomiting, abdominal pain, diarrhea, constipation, blood in stool and abdominal distention.  Genitourinary: Denies dysuria, urgency, frequency, hematuria, flank pain and difficulty urinating.  Endocrine: Denies: hot or cold intolerance,  polyuria, polydipsia. Musculoskeletal: Denies myalgias, back pain, joint swelling, arthralgias and gait problem.  Skin: Denies pallor, rash and wound.  Neurological: Denies dizziness, seizures, syncope, weakness, light-headedness, numbness and headaches.  Psychiatric/Behavioral: Denies confusion,    Past Medical History  Diagnosis Date  . Insomnia     chronic  . IBS (irritable bowel syndrome)   . HTN (hypertension)   . Dyslipidemia   . Family history of cardiovascular disease   . PAF (paroxysmal atrial fibrillation)     coumadin was stopped secondary to GI Bleed, on amiodarone; monitor march 2014-NSR  . CAD (coronary artery disease)     PCI x 2  . Hypothyroidism   . Chest pain   . Dyspnea on exertion   . GERD (gastroesophageal reflux disease)   .  Diverticulitis    Past Surgical History  Procedure Laterality Date  . Coronary angioplasty with stent  placement  01/05/2011    2.5x57mm Promus DES stent to RCA post dilated to 2.75x31mm   . Coronary angioplasty with stent placement  ?    3 stents per patient  . Appendectomy    . Cholecystectomy    . Abdominal hysterectomy     Social History:  reports that she has never smoked. She has never used smokeless tobacco. She reports that she does not drink alcohol or use illicit drugs.  Allergies  Allergen Reactions  . Codeine     Post-surgical reaction of unknown type.  . Morphine And Related     Unknown.    Marland Kitchen Nexium [Esomeprazole]     Aggravates IBS.   Marland Kitchen Penicillins     Unknown.  . Statins     Had jaundice as child.  Does tolerate current statin therapy.    . Sulfa Antibiotics     Unknown childhood reaction.      Family History  Problem Relation Age of Onset  . Diabetes Maternal Aunt   . Diabetes Son   . Heart disease      Both sides of family    Prior to Admission medications   Medication Sig Start Date End Date Taking? Authorizing Provider  amiodarone (PACERONE) 200 MG tablet Take 200 mg by mouth daily.   Yes Historical Provider, MD  Ascorbic Acid (VITAMIN C PO) Take by mouth.   Yes Historical Provider, MD  aspirin 325 MG EC tablet Take 325 mg by mouth daily.   Yes Historical Provider, MD  atenolol (TENORMIN) 25 MG tablet Take 25 mg by mouth daily.   Yes Historical Provider, MD  Cholecalciferol (VITAMIN D PO) Take by mouth.   Yes Historical Provider, MD  ipratropium (ATROVENT) 0.03 % nasal spray Place 1 spray into the nose as needed for rhinitis.  05/19/13  Yes Historical Provider, MD  levothyroxine (SYNTHROID, LEVOTHROID) 100 MCG tablet Take 100 mcg by mouth daily before breakfast.   Yes Historical Provider, MD  Multiple Vitamin (MULTIVITAMIN) capsule Take 1 capsule by mouth daily.   Yes Historical Provider, MD  nitroGLYCERIN (NITROSTAT) 0.4 MG SL tablet Place 0.4 mg under the tongue every 5 (five) minutes as needed for chest pain.   Yes Historical Provider, MD  pantoprazole  (PROTONIX) 40 MG tablet Take 40 mg by mouth daily as needed (reflux.).    Yes Historical Provider, MD  pravastatin (PRAVACHOL) 10 MG tablet Take 10 mg by mouth daily.  09/11/13  Yes Historical Provider, MD  zolpidem (AMBIEN CR) 12.5 MG CR tablet Take 12.5 mg by mouth every other day.   Yes Historical Provider, MD     Physical Exam:  Filed Vitals:   03/23/14 1815 03/23/14 1847 03/23/14 1858 03/23/14 1900  BP: 205/74 180/76 203/64 187/65  Pulse: 62  56 57  Temp:      TempSrc:      Resp: 17  18 17   Height:      Weight:      SpO2: 98%  96% 96%    Constitutional: Vital signs reviewed.  Elderly female in no acute distress. HEENT: no pallor, no icterus, moist oral mucosa, no JVD Cardiovascular: RRR, S1 normal, S2 normal, no MRG Chest: CTAB, no wheezes, rales, or rhonchi Abdominal: Soft. Non-tender, non-distended, bowel sounds are normal,  Ext: warm, no edema Neurological: A&O x3, non focal  Labs on Admission:  Basic Metabolic Panel:  Recent  Labs Lab 03/23/14 1627  NA 138  K 4.3  CL 104  CO2 21  GLUCOSE 114*  BUN 24*  CREATININE 1.26*  CALCIUM 9.4   Liver Function Tests: No results found for this basename: AST, ALT, ALKPHOS, BILITOT, PROT, ALBUMIN,  in the last 168 hours No results found for this basename: LIPASE, AMYLASE,  in the last 168 hours No results found for this basename: AMMONIA,  in the last 168 hours CBC:  Recent Labs Lab 03/23/14 1627  WBC 4.6  HGB 9.4*  HCT 29.8*  MCV 85.1  PLT 165   Cardiac Enzymes: No results found for this basename: CKTOTAL, CKMB, CKMBINDEX, TROPONINI,  in the last 168 hours BNP: No components found with this basename: POCBNP,  CBG: No results found for this basename: GLUCAP,  in the last 168 hours  Radiological Exams on Admission: Dg Chest Port 1 View  03/23/2014   CLINICAL DATA:  Chest pain.  EXAM: PORTABLE CHEST - 1 VIEW  COMPARISON:  None.  FINDINGS: The lungs are clear. Heart size is mildly enlarged. No pneumothorax or  pleural effusion. No focal bony abnormality.  IMPRESSION: Cardiomegaly without acute disease.   Electronically Signed   By: Drusilla Kanner M.D.   On: 03/23/2014 17:06    EKG: NSR at 67, QTC of 514, no ST-T changes  Assessment/Plan  Principal Problem:   Unstable angina Admit to step down. Continue aspirin. Started on nitroglycerin drip given accelerated hypertension. Will switch atenolol to metoprolol 50 mg twice a day. Continue sublingual nitrate when necessary. Check serial troponins and EKG. -Given the positive for occult blood in the ED I would avoid full dose anticoagulation at this time. -No signs of CHF and has some diastolic dysfunction on a previous echo. -Will followup with cardiology evaluation and recommendations.   Active Problems:   Accelerated hypertension/ Hypertensive urgency Patient reports being compliant with her medications and blood pressure being stable dating outpatient followup visits. Switched atenolol to metoprolol. Started on nitroglycerin drip. I would place her on when necessary hydralazine. I will repeat a 2-D echo.   Coronary artery disease Status post PCI X2 weeks 4 stents placed in the past. Stress test in 07/2013 was normal. Continue aspirin beta blocker and statin.    Paroxysmal atrial fibrillation Rate controlled and in sinus rhythm on amiodarone    Anemia No baseline available. She was seen by GI about 6 months back and as per the notes she had normal EGD and colonoscopy in 2012. Stool for occult blood in the ED was positive. -Order type and screen. Patient reports having blood flow done recently at her PCPs office and need to get the results in the morning to obtain a baseline. She will need GI evaluation for repeat EGD if significant drop in hemoglobin noted. Denies use of NSAIDs.    Hypothyroidism Continue Synthroid   ? AKI/ CKD Baseline renal function not known. Will hold Lasix. Monitor renal function in a.m.   GERD with hx of  hiatal  hernia Continue protonix    Diet:cardiac  DVT prophylaxis: sq lovenox   Code Status: full code Family Communication: discussed with grandson at bedside Disposition Plan: home once stable  Jeanne Hart [facility] Pager [phone]  Total time spent on admission :70 minutes  If 7PM-7AM, please contact night-coverage [email or web address] Password [facility] 03/23/2014, 7:43 PM

## 2014-03-23 NOTE — ED Notes (Signed)
Notified admitting physician that pt's HR was 51 and pt is currently on nitro drip. Admitting physician ok with patient staying on nitro drip at this time.

## 2014-03-23 NOTE — ED Notes (Signed)
Per pt experiencing SOB for weeks with new onset of left sided heaviness in chest starting this am. Pt reports hx of 4 stents placed. Pt a/o x4.

## 2014-03-23 NOTE — Progress Notes (Signed)
Utilization Review completed.  Maricarmen Braziel RN CM  

## 2014-03-23 NOTE — ED Provider Notes (Signed)
She reports anterior chest pain typical of angina onset approximately 2 weeks ago. Discomfort is left-sided mild at present. Treated self with aspirin prior to coming here. She has run out of nitroglycerin. She also admits to dyspnea with exertion for the past 2 weeks. Discomfort is minimal at present on exam no distress lungs clear with patient heart regular in rhythm abdomen nondistended nontender extremities no edema. Spoke with Dr. Meda Coffee, cardiologist on call who requests admission to hospitalist service. Spoke with Dr.Dhumgel who will arrange for this. At 7:10 PM patient reports pain is5/10 . She is noted to be hypertensive nitroglycerin intravenous drip ordered.  8:25 PM pain is improved after treatment with intravenous nitroglycerin drip. Results for orders placed during the hospital encounter of 03/23/14  CBC      Result Value Ref Range   WBC 4.6  4.0 - 10.5 K/uL   RBC 3.50 (*) 3.87 - 5.11 MIL/uL   Hemoglobin 9.4 (*) 12.0 - 15.0 g/dL   HCT 29.8 (*) 36.0 - 46.0 %   MCV 85.1  78.0 - 100.0 fL   MCH 26.9  26.0 - 34.0 pg   MCHC 31.5  30.0 - 36.0 g/dL   RDW 16.4 (*) 11.5 - 15.5 %   Platelets 165  150 - 400 K/uL  BASIC METABOLIC PANEL      Result Value Ref Range   Sodium 138  137 - 147 mEq/L   Potassium 4.3  3.7 - 5.3 mEq/L   Chloride 104  96 - 112 mEq/L   CO2 21  19 - 32 mEq/L   Glucose, Bld 114 (*) 70 - 99 mg/dL   BUN 24 (*) 6 - 23 mg/dL   Creatinine, Ser 1.26 (*) 0.50 - 1.10 mg/dL   Calcium 9.4  8.4 - 10.5 mg/dL   GFR calc non Af Amer 38 (*) >90 mL/min   GFR calc Af Amer 44 (*) >90 mL/min  PROTIME-INR      Result Value Ref Range   Prothrombin Time 12.7  11.6 - 15.2 seconds   INR 0.97  0.00 - 1.49  PRO B NATRIURETIC PEPTIDE      Result Value Ref Range   Pro B Natriuretic peptide (BNP) 1245.0 (*) 0 - 450 pg/mL  I-STAT TROPOININ, ED      Result Value Ref Range   Troponin i, poc 0.00  0.00 - 0.08 ng/mL   Comment 3           POC OCCULT BLOOD, ED      Result Value Ref Range   Fecal Occult Bld POSITIVE (*) NEGATIVE   Dg Ankle Complete Left  02/21/2014   CLINICAL DATA:  Injury of left ankle with ankle pain and swelling  EXAM: LEFT ANKLE COMPLETE - 3+ VIEW  COMPARISON:  None.  FINDINGS: There is no evidence of fracture, dislocation, or joint effusion. There is plantar calcaneal spur. Minimal soft tissue swelling around the ankle.  IMPRESSION: No acute fracture or dislocation.   Electronically Signed   By: Abelardo Diesel M.D.   On: 02/21/2014 21:23   Dg Chest Port 1 View  03/23/2014   CLINICAL DATA:  Chest pain.  EXAM: PORTABLE CHEST - 1 VIEW  COMPARISON:  None.  FINDINGS: The lungs are clear. Heart size is mildly enlarged. No pneumothorax or pleural effusion. No focal bony abnormality.  IMPRESSION: Cardiomegaly without acute disease.   Electronically Signed   By: Inge Rise M.D.   On: 03/23/2014 17:06   Dg Foot Complete Left  02/21/2014   CLINICAL DATA:  Left foot and ankle pain following injury.  EXAM: LEFT FOOT - COMPLETE 3+ VIEW  COMPARISON:  None.  FINDINGS: There is a hallux valgus contour of the first ray. There are mild degenerative changes at the first metatarsophalangeal joint. The phalanges appear intact. The metatarsals also appear intact. The bones of the hindfoot exhibit no acute abnormalities. There is a plantar calcaneal spur. Very mild soft tissue swelling over the midfoot is present.  IMPRESSION: There are chronic findings but there is no evidence of an acute fracture nor dislocation of the bones of the left foot.   Electronically Signed   By: David  Martinique   On: 02/21/2014 23:27   Chest xray viewed by me CRITICAL CARE Performed by: Orlie Dakin Total critical care time: 30 minute Critical care time was exclusive of separately billable procedures and treating other patients. Critical care was necessary to treat or prevent imminent or life-threatening deterioration. Critical care was time spent personally by me on the following activities: development  of treatment plan with patient and/or surrogate as well as nursing, discussions with consultants, evaluation of patient's response to treatment, examination of patient, obtaining history from patient or surrogate, ordering and performing treatments and interventions, ordering and review of laboratory studies, ordering and review of radiographic studies, pulse oximetry and re-evaluation of patient's condition.  Orlie Dakin, MD 03/23/14 2034

## 2014-03-23 NOTE — ED Provider Notes (Signed)
Medical screening examination/treatment/procedure(s) were conducted as a shared visit with non-physician practitioner(s) and myself.  I personally evaluated the patient during the encounter.   EKG Interpretation   Date/Time:  Monday March 23 2014 16:18:13 EDT Ventricular Rate:  67 PR Interval:  187 QRS Duration: 100 QT Interval:  477 QTC Calculation: 504 R Axis:   42 Text Interpretation:  Sinus rhythm Prolonged QT interval Baseline wander  in lead(s) II aVF Confirmed by Kenna Gilbert, KRISTEN [phone]) on 03/23/2014  4:19:52 PM       Orlie Dakin, MD 03/23/14 2035

## 2014-03-23 NOTE — Consult Note (Addendum)
CARDIOLOGY CONSULT NOTE   Patient ID: Jeanne Hart MRN: [id number], DOB/AGE: 1927/04/02   Admit date: 03/23/2014 Date of Consult: 03/23/2014  Primary Physician: Thressa Sheller, MD Primary Cardiologist: Quay Burow  Reason for consult:  Chest pain   Problem List  Past Medical History  Diagnosis Date  . Insomnia     chronic  . IBS (irritable bowel syndrome)   . HTN (hypertension)   . Dyslipidemia   . Family history of cardiovascular disease   . PAF (paroxysmal atrial fibrillation)     coumadin was stopped secondary to GI Bleed, on amiodarone; monitor march 2014-NSR  . CAD (coronary artery disease)     PCI x 2  . Hypothyroidism   . Chest pain   . Dyspnea on exertion     Past Surgical History  Procedure Laterality Date  . Coronary angioplasty with stent placement  01/05/2011    2.5x4mm Promus DES stent to RCA post dilated to 2.75x73mm   . Coronary angioplasty with stent placement  ?    3 stents per patient  . Appendectomy    . Cholecystectomy    . Abdominal hysterectomy      Allergies  Allergies  Allergen Reactions  . Codeine     Post-surgical reaction of unknown type.  . Morphine And Related     Unknown.    Marland Kitchen Nexium [Esomeprazole]     Aggravates IBS.   Marland Kitchen Penicillins     Unknown.  . Statins     Had jaundice as child.  Does tolerate current statin therapy.    . Sulfa Antibiotics     Unknown childhood reaction.      HPI   Jeanne Hart is a 80 y.o. female a history of coronary artery disease, hypertension, dyslipidemia, IBS hypothyroidism, strong family history of heart disease, paroxysmal A - fibrillation on amiodarone patient moved to [place] from [place] in 2013 to be closer to her family. She's had a total of 4 stents placed to her coronary arteries. 3 of which are at [facility] and the other one at [place]. Her last nuclear stress test was December 2012 was considered a low-risk test the ejection fraction 76%.  Patient was seen by Dr Gwenlyn Found in  the office on 08/22/13 for heart palpitations and lower extremity edema or states that she gets episodes of palpitations once and awhile, which last a short period of time, maybe several minutes and resolve spontaneously.  She's had 2 episodes of chest pain which she reports as tightness in 4 of 10 in intensity, with maybe a little bit of diaphoresis. When she had her first episode the other day he took one sublingual nitroglycerin glycerin which would provide relief after a few minutes. She also reports dyspnea on exertion when she walks on a hot day and she does a lot of walking apparently. She also reported one week where she had a few episodes on mild nausea.  She underwent a Lexiscan nuclear test at that time and it was negative for ischemia, TTE showed normal LV function with mild LVH, and grade II diastolic dysfunction with elevated filling pressures.  Her BP on the day of office visit was 212/90. It was attributed to stress and her medication was not changed. She was only on atenolol 25 mg PO daily at that time.   Today she presented with anterior chest pain typical of angina onset approximately 2 weeks ago. Discomfort is left-sided mild at present. She was using sl NTG at home but  ran out today. The pain was at rest, retrosternal.  She also admits to dyspnea with exertion for the past 2 weeks. No palpitations, no syncope. No PND, orthopnea, LE edema.  She complains of severe insomnia with side effects to Ambien (sleep walking) so she uses it with caution.     Inpatient Medications  . atenolol  25 mg Oral Once    Family History Family History  Problem Relation Age of Onset  . Diabetes Maternal Aunt   . Diabetes Son   . Heart disease      Both sides of family     Social History History   Social History  . Marital Status: Married    Spouse Name: N/A    Number of Children: 3  . Years of Education: N/A   Occupational History  . Sales/Office    Social History Main Topics  .  Smoking status: Never Smoker   . Smokeless tobacco: Never Used  . Alcohol Use: No  . Drug Use: No  . Sexual Activity: Not on file   Other Topics Concern  . Not on file   Social History Narrative  . No narrative on file     Review of Systems  General:  No chills, fever, night sweats or weight changes.  Cardiovascular:  No chest pain, dyspnea on exertion, edema, orthopnea, palpitations, paroxysmal nocturnal dyspnea. Dermatological: No rash, lesions/masses Respiratory: No cough, dyspnea Urologic: No hematuria, dysuria Abdominal:   No nausea, vomiting, diarrhea, bright red blood per rectum, melena, or hematemesis Neurologic:  No visual changes, wkns, changes in mental status. All other systems reviewed and are otherwise negative except as noted above.  Physical Exam  Blood pressure 203/64, pulse 56, temperature 98.2 F (36.8 C), temperature source Oral, resp. rate 18, height 5\' 7"  (1.702 m), weight 154 lb (69.854 kg), SpO2 96.00%.  General: Pleasant, NAD Psych: Normal affect. Neuro: Alert and oriented X 3. Moves all extremities spontaneously. HEENT: Normal  Neck: Supple without bruits or JVD. Lungs:  Resp regular and unlabored, CTA. Heart: RRR no s3, s4, or murmurs. Abdomen: Soft, non-tender, non-distended, BS + x 4.  Extremities: No clubbing, cyanosis or edema. DP/PT/Radials 2+ and equal bilaterally.  Labs  No results found for this basename: CKTOTAL, CKMB, TROPONINI,  in the last 72 hours Lab Results  Component Value Date   WBC 4.6 03/23/2014   HGB 9.4* 03/23/2014   HCT 29.8* 03/23/2014   MCV 85.1 03/23/2014   PLT 165 03/23/2014    Recent Labs Lab 03/23/14 1627  NA 138  K 4.3  CL 104  CO2 21  BUN 24*  CREATININE 1.26*  CALCIUM 9.4  GLUCOSE 114*   Dg Chest Port 1 View  03/23/2014   CLINICAL DATA:  Chest pain.  EXAM: PORTABLE CHEST - 1 VIEW  COMPARISON:  None.  FINDINGS: The lungs are clear. Heart size is mildly enlarged. No pneumothorax or pleural effusion. No  focal bony abnormality.  IMPRESSION: Cardiomegaly without acute disease.     Echocardiogram - 07/31/2013 - Left ventricle: The cavity size was at the upper limits of normal. Wall thickness was increased in a pattern of mild LVH. Systolic function was normal. The estimated ejection fraction was in the range of 55% to 60%. Wall motion was normal; there were no regional wall motion abnormalities. Features are consistent with a pseudonormal left ventricular filling pattern, with concomitant abnormal relaxation and increased filling pressure (grade 2 diastolic dysfunction). Doppler parameters are consistent with elevated mean left atrial filling  pressure. - Aortic valve: Trileaflet; mildly thickened leaflets. - Mitral valve: Moderately calcified annulus affecting the posterior annulus most significantly. Mild regurgitation. Valve area by pressure half-time: 2.12cm^2. - Left atrium: The atrium was moderately dilated. - Right ventricle: Systolic pressure was increased. - Right atrium: The atrium was mildly dilated. - Atrial septum: No defect or patent foramen ovale was identified. - Tricuspid valve: Moderate regurgitation. - Pulmonary arteries: PA peak pressure: 56mm Hg (S). Impressions:  - The right ventricular systolic pressure was increased consistent with mild pulmonary hypertension.  Lexiscan nuclear stress test 07/29/2013  Impression Exercise Capacity: Lexiscan with no exercise. BP Response: Normal blood pressure response. Clinical Symptoms: No significant symptoms noted. ECG Impression: No significant ECG changes with Lexiscan. Comparison with Prior Nuclear Study: No change from previous  nuclear study performed 2012. Overall Impression: Normal stress nuclear study.  ECG: Sinus bradycardia, prolonged QT/QTc 477/504 ms     ASSESSMENT AND PLAN  80 year old female  1. Hypertensive urgency - agree with NTG drip, would start Imdur 30 mg po daily and uptitrate as needed,I  would consider adding other agent such as amlodipine especially with h/o pulmonary hypertension. I would stop atenolol as she is bradycardic - 49 BPM and has prolonged QT.   2. Acute on chronic diastolic CHF - grade II diastolic dysfunction on TTE in 10/14. Secondary to hypertension, we will intensify therapy as above. BNP 1200  3. Chest pain - secondary to hypertensive urgency, the first troponin negative, ECG no ischemic changes, we will continue to cycle, but unless positive enzymes I would not proceed with ischemic work up, recent negative stress test  4. Prolonged QT - hold atenolol  5. Anemia - positive FOBT, h/o hiatal hernia, no recent EGD or colonoscopy. I would recommend to consult GI.   6. Parox a-fib - in SB, continue amiodarone  Signed, Dorothy Spark, MD, [facility] 03/23/2014, 7:16 PM

## 2014-03-24 ENCOUNTER — Encounter (HOSPITAL_COMMUNITY): Payer: Self-pay

## 2014-03-24 ENCOUNTER — Ambulatory Visit: Payer: Medicare Other | Admitting: Cardiology

## 2014-03-24 DIAGNOSIS — D649 Anemia, unspecified: Secondary | ICD-10-CM | POA: Diagnosis not present

## 2014-03-24 DIAGNOSIS — E785 Hyperlipidemia, unspecified: Secondary | ICD-10-CM

## 2014-03-24 DIAGNOSIS — Z8719 Personal history of other diseases of the digestive system: Secondary | ICD-10-CM

## 2014-03-24 DIAGNOSIS — I10 Essential (primary) hypertension: Secondary | ICD-10-CM | POA: Diagnosis not present

## 2014-03-24 DIAGNOSIS — R079 Chest pain, unspecified: Secondary | ICD-10-CM | POA: Diagnosis not present

## 2014-03-24 DIAGNOSIS — I200 Unstable angina: Secondary | ICD-10-CM | POA: Diagnosis not present

## 2014-03-24 LAB — URINE MICROSCOPIC-ADD ON

## 2014-03-24 LAB — ABO/RH: ABO/RH(D): A POS

## 2014-03-24 LAB — TYPE AND SCREEN
ABO/RH(D): A POS
ANTIBODY SCREEN: NEGATIVE

## 2014-03-24 LAB — CBC
HCT: 31.3 % — ABNORMAL LOW (ref 36.0–46.0)
HEMOGLOBIN: 9.8 g/dL — AB (ref 12.0–15.0)
MCH: 26.6 pg (ref 26.0–34.0)
MCHC: 31.3 g/dL (ref 30.0–36.0)
MCV: 84.8 fL (ref 78.0–100.0)
PLATELETS: 193 10*3/uL (ref 150–400)
RBC: 3.69 MIL/uL — ABNORMAL LOW (ref 3.87–5.11)
RDW: 16.3 % — AB (ref 11.5–15.5)
WBC: 6 10*3/uL (ref 4.0–10.5)

## 2014-03-24 LAB — URINALYSIS, ROUTINE W REFLEX MICROSCOPIC
Bilirubin Urine: NEGATIVE
GLUCOSE, UA: NEGATIVE mg/dL
KETONES UR: NEGATIVE mg/dL
Nitrite: NEGATIVE
Protein, ur: NEGATIVE mg/dL
Specific Gravity, Urine: 1.015 (ref 1.005–1.030)
UROBILINOGEN UA: 0.2 mg/dL (ref 0.0–1.0)
pH: 6 (ref 5.0–8.0)

## 2014-03-24 LAB — TROPONIN I
Troponin I: <0.3 ng/mL (ref <0.30)
Troponin I: <0.3 ng/mL (ref <0.30)
Troponin I: <0.3 ng/mL (ref <0.30)

## 2014-03-24 LAB — BASIC METABOLIC PANEL
BUN: 25 mg/dL — ABNORMAL HIGH (ref 6–23)
CALCIUM: 9.8 mg/dL (ref 8.4–10.5)
CHLORIDE: 101 meq/L (ref 96–112)
CO2: 25 mEq/L (ref 19–32)
CREATININE: 1.3 mg/dL — AB (ref 0.50–1.10)
GFR calc non Af Amer: 37 mL/min — ABNORMAL LOW (ref >90)
GFR, EST AFRICAN AMERICAN: 42 mL/min — AB (ref >90)
Glucose, Bld: 95 mg/dL (ref 70–99)
Potassium: 3.8 mEq/L (ref 3.7–5.3)
Sodium: 141 mEq/L (ref 137–147)

## 2014-03-24 MED ORDER — PANTOPRAZOLE SODIUM 40 MG PO TBEC
40.0000 mg | DELAYED_RELEASE_TABLET | Freq: Every day | ORAL | Status: DC
  Administered 2014-03-24 – 2014-03-25 (×2): 40 mg via ORAL
  Filled 2014-03-24 (×2): qty 1

## 2014-03-24 MED ORDER — AMLODIPINE BESYLATE 5 MG PO TABS
5.0000 mg | ORAL_TABLET | Freq: Every day | ORAL | Status: DC
  Administered 2014-03-24 – 2014-03-25 (×2): 5 mg via ORAL
  Filled 2014-03-24 (×2): qty 1

## 2014-03-24 MED ORDER — ZOLPIDEM TARTRATE 5 MG PO TABS
5.0000 mg | ORAL_TABLET | Freq: Every evening | ORAL | Status: DC | PRN
  Administered 2014-03-24: 5 mg via ORAL
  Filled 2014-03-24: qty 1

## 2014-03-24 NOTE — Progress Notes (Signed)
    Subjective:  Still with chest pressure; no dyspnea   Objective:  Filed Vitals:   03/24/14 0400 03/24/14 0500 03/24/14 0600 03/24/14 0700  BP: 168/47 161/55 162/54 138/57  Pulse: 60 61 59 59  Temp: 98 F (36.7 C)     TempSrc: Oral     Resp: 12 18 18 17   Height:      Weight: 154 lb 5.2 oz (70 kg)     SpO2: 95% 94% 93% 97%    Intake/Output from previous day:  Intake/Output Summary (Last 24 hours) at 03/24/14 0742 Last data filed at 03/24/14 2355  Gross per 24 hour  Intake  15.45 ml  Output   2550 ml  Net -2534.55 ml    Physical Exam: Physical exam: Well-developed well-nourished in no acute distress.  Skin is warm and dry.  HEENT is normal.  Neck is supple.  Chest is clear to auscultation with normal expansion.  Cardiovascular exam is regular rate and rhythm.  Abdominal exam nontender or distended. No masses palpated. Extremities show no edema. neuro grossly intact    Lab Results: Basic Metabolic Panel:  Recent Labs  03/23/14 1627 03/24/14 0333  NA 138 141  K 4.3 3.8  CL 104 101  CO2 21 25  GLUCOSE 114* 95  BUN 24* 25*  CREATININE 1.26* 1.30*  CALCIUM 9.4 9.8   CBC:  Recent Labs  03/23/14 1627 03/24/14 0333  WBC 4.6 6.0  HGB 9.4* 9.8*  HCT 29.8* 31.3*  MCV 85.1 84.8  PLT 165 193   Cardiac Enzymes:  Recent Labs  03/23/14 2320 03/24/14 0333  TROPONINI <0.30 <0.30     Assessment/Plan:  1 chest pain-symptoms are atypical. She states she has had chest pressure since yesterday morning. Admission electrocardiogram with no ST changes and enzymes negative. Recent negative nuclear study. Plan medical therapy for now. 2 hypertensive urgency-blood pressure is improving. Discontinue nitroglycerin drip. Increase Norvasc to 5 mg daily and continue imdur. Ambulate today and follow blood pressure. If controlled she can be discharged tomorrow morning and followup with Dr. Gwenlyn Found. 3 coronary artery disease-continue aspirin. Apparently intolerant to  higher dose statins. 4 normocytic anemia-further workup per primary care. 5 renal insufficiency-chronicity unclear. No baseline labs. Further evaluation per primary care. 6 history of atrial fibrillation-continue amiodarone and aspirin. Coumadin discontinued previously secondary to GI bleed by report.   Kirk Ruths 03/24/2014, 7:42 AM

## 2014-03-24 NOTE — Progress Notes (Addendum)
[facility] PROGRESS NOTE  Jeanne Hart BMW:[id number] DOB: 23-Oct-1926 DOA: 03/23/2014 PCP: Thressa Sheller, MD  Assessment/Plan: 1-Chest pain: Still with chest pressure, she has pain on palpation of left breast.  -Continue aspirin. Transition off nitroglycerin Gtt.  -No signs of CHF and has some diastolic dysfunction on a previous echo.  -Appreciate cardiology evaluation.  -Adjust BP medications.  -started on Imdur.  -Troponin times 2 negative.   Accelerated hypertension/ Hypertensive urgency  -She was started on nitroglycerin drip. PRN hydralazine. -Started on Imdur.  -Atenolol on hold due to bradycardia.  -Started on Norvasc.   Coronary artery disease  Status post PCI X2 weeks 4 stents placed in the past. Stress test in 07/2013 was normal.  Continue aspirin, and statin.   Paroxysmal atrial fibrillation  Rate controlled and in sinus rhythm on amiodarone   Anemia  No baseline available. She was seen by GI about 6 months back and as per the notes she had normal EGD and colonoscopy in 2012. Stool for occult blood in the ED was positive.  Hb stable at 9.8. Monitor for GI bleed. If significant drop in HB might need GI evaluation.   Hypothyroidism  Continue Synthroid   ? AKI/ CKD  Baseline renal function not known. Will hold Lasix. Monitor renal function in a.m.  Check UA.   GERD with hx of hiatal hernia  Continue protonix  DVT prophylaxis: SCD. Discontinue lovenox due to occult blood positive.   Code Status: Full Code.  Family Communication: Care discussed with patient.  Disposition Plan:    Consultants:  Cardiology  Procedures:  none  Antibiotics:  none  HPI/Subjective: Feeling better. Still with mild chest pressure. She has pain on palpation left  breast.  She has IBS, no significant black stool.   Objective: Filed Vitals:   03/24/14 0700  BP: 138/57  Pulse: 59  Temp:   Resp: 17    Intake/Output Summary (Last 24 hours) at 03/24/14  0756 Last data filed at 03/24/14 2725  Gross per 24 hour  Intake  15.45 ml  Output   2550 ml  Net -2534.55 ml   Filed Weights   03/23/14 1619 03/23/14 2104 03/24/14 0400  Weight: 69.854 kg (154 lb) 71.9 kg (158 lb 8.2 oz) 70 kg (154 lb 5.2 oz)    Exam:   General:  No distress.   Cardiovascular: S 1, S 2 RRR  Respiratory: CTA  Abdomen: Bs present, soft, NT  Musculoskeletal: no edema.   Data Reviewed: Basic Metabolic Panel:  Recent Labs Lab 03/23/14 1627 03/24/14 0333  NA 138 141  K 4.3 3.8  CL 104 101  CO2 21 25  GLUCOSE 114* 95  BUN 24* 25*  CREATININE 1.26* 1.30*  CALCIUM 9.4 9.8   Liver Function Tests: No results found for this basename: AST, ALT, ALKPHOS, BILITOT, PROT, ALBUMIN,  in the last 168 hours No results found for this basename: LIPASE, AMYLASE,  in the last 168 hours No results found for this basename: AMMONIA,  in the last 168 hours CBC:  Recent Labs Lab 03/23/14 1627 03/24/14 0333  WBC 4.6 6.0  HGB 9.4* 9.8*  HCT 29.8* 31.3*  MCV 85.1 84.8  PLT 165 193   Cardiac Enzymes:  Recent Labs Lab 03/23/14 2320 03/24/14 0333  TROPONINI <0.30 <0.30   BNP (last 3 results)  Recent Labs  03/23/14 1627  PROBNP 1245.0*   CBG: No results found for this basename: GLUCAP,  in the last 168 hours  Recent Results (from the  past 240 hour(s))  MRSA PCR SCREENING     Status: None   Collection Time    03/23/14 10:16 PM      Result Value Ref Range Status   MRSA by PCR NEGATIVE  NEGATIVE Final   Comment:            The GeneXpert MRSA Assay (FDA     approved for NASAL specimens     only), is one component of a     comprehensive MRSA colonization     surveillance program. It is not     intended to diagnose MRSA     infection nor to guide or     monitor treatment for     MRSA infections.     Studies: Dg Chest Port 1 View  03/23/2014   CLINICAL DATA:  Chest pain.  EXAM: PORTABLE CHEST - 1 VIEW  COMPARISON:  None.  FINDINGS: The lungs are  clear. Heart size is mildly enlarged. No pneumothorax or pleural effusion. No focal bony abnormality.  IMPRESSION: Cardiomegaly without acute disease.   Electronically Signed   By: Inge Rise M.D.   On: 03/23/2014 17:06    Scheduled Meds: . amiodarone  200 mg Oral Daily  . amLODipine  5 mg Oral Daily  . aspirin  325 mg Oral Daily  . cholecalciferol  1,000 Units Oral Daily  . enoxaparin (LOVENOX) injection  40 mg Subcutaneous Q24H  . isosorbide mononitrate  30 mg Oral Daily  . levothyroxine  100 mcg Oral QAC breakfast  . multivitamin with minerals  1 tablet Oral Daily  . pravastatin  10 mg Oral q1800  . vitamin C  250 mg Oral Daily   Continuous Infusions:   Principal Problem:   Unstable angina Active Problems:   Coronary artery disease   Dyslipidemia   Paroxysmal atrial fibrillation   Hypertensive urgency   Anemia   Chest pain at rest   Hypothyroidism    Time spent: 35 minutes.     Niel Hummer A  [facility] Pager [phone]. If 7PM-7AM, please contact night-coverage at [email or web address], password [facility] 03/24/2014, 7:56 AM  LOS: 1 day

## 2014-03-25 ENCOUNTER — Other Ambulatory Visit: Payer: Self-pay

## 2014-03-25 DIAGNOSIS — I5033 Acute on chronic diastolic (congestive) heart failure: Secondary | ICD-10-CM | POA: Diagnosis not present

## 2014-03-25 DIAGNOSIS — N179 Acute kidney failure, unspecified: Secondary | ICD-10-CM | POA: Diagnosis not present

## 2014-03-25 DIAGNOSIS — R079 Chest pain, unspecified: Secondary | ICD-10-CM | POA: Diagnosis not present

## 2014-03-25 DIAGNOSIS — D649 Anemia, unspecified: Secondary | ICD-10-CM | POA: Diagnosis not present

## 2014-03-25 DIAGNOSIS — I129 Hypertensive chronic kidney disease with stage 1 through stage 4 chronic kidney disease, or unspecified chronic kidney disease: Secondary | ICD-10-CM | POA: Diagnosis not present

## 2014-03-25 DIAGNOSIS — E785 Hyperlipidemia, unspecified: Secondary | ICD-10-CM | POA: Diagnosis not present

## 2014-03-25 DIAGNOSIS — I10 Essential (primary) hypertension: Secondary | ICD-10-CM | POA: Diagnosis not present

## 2014-03-25 DIAGNOSIS — I2510 Atherosclerotic heart disease of native coronary artery without angina pectoris: Secondary | ICD-10-CM | POA: Diagnosis not present

## 2014-03-25 LAB — BASIC METABOLIC PANEL
BUN: 26 mg/dL — ABNORMAL HIGH (ref 6–23)
CO2: 25 meq/L (ref 19–32)
Calcium: 9.4 mg/dL (ref 8.4–10.5)
Chloride: 100 mEq/L (ref 96–112)
Creatinine, Ser: 1.28 mg/dL — ABNORMAL HIGH (ref 0.50–1.10)
GFR calc Af Amer: 43 mL/min — ABNORMAL LOW (ref >90)
GFR calc non Af Amer: 37 mL/min — ABNORMAL LOW (ref >90)
GLUCOSE: 99 mg/dL (ref 70–99)
POTASSIUM: 3.9 meq/L (ref 3.7–5.3)
Sodium: 137 mEq/L (ref 137–147)

## 2014-03-25 LAB — CBC
HCT: 31 % — ABNORMAL LOW (ref 36.0–46.0)
HEMOGLOBIN: 9.8 g/dL — AB (ref 12.0–15.0)
MCH: 26.6 pg (ref 26.0–34.0)
MCHC: 31.6 g/dL (ref 30.0–36.0)
MCV: 84 fL (ref 78.0–100.0)
Platelets: 209 10*3/uL (ref 150–400)
RBC: 3.69 MIL/uL — ABNORMAL LOW (ref 3.87–5.11)
RDW: 16.2 % — ABNORMAL HIGH (ref 11.5–15.5)
WBC: 5.2 10*3/uL (ref 4.0–10.5)

## 2014-03-25 MED ORDER — ISOSORBIDE MONONITRATE ER 30 MG PO TB24: 30.0000 mg | ORAL_TABLET | Freq: Every day | ORAL | Status: DC

## 2014-03-25 MED ORDER — CIPROFLOXACIN HCL 250 MG PO TABS: 250.0000 mg | ORAL_TABLET | Freq: Two times a day (BID) | ORAL | Status: DC

## 2014-03-25 MED ORDER — AMLODIPINE BESYLATE 5 MG PO TABS: 5.0000 mg | ORAL_TABLET | Freq: Every day | ORAL | Status: DC

## 2014-03-25 NOTE — Progress Notes (Signed)
Discharge to home, alert and oriented, no complaints of pain or discomfort. PIV removed o s/s of infiltration noted/ D/c instructions and follow up appointments done and discussed with patient.  P/u by grandson Joey.

## 2014-03-25 NOTE — Progress Notes (Signed)
    Subjective:  No chest pain; no dyspnea   Objective:  Filed Vitals:   03/24/14 1741 03/24/14 2038 03/25/14 0336 03/25/14 0424  BP: 134/59 125/47  151/59  Pulse: 53 57  80  Temp: 97.9 F (36.6 C) 97.6 F (36.4 C)  98.1 F (36.7 C)  TempSrc: Oral Oral  Oral  Resp: 18 20  18   Height:      Weight:   154 lb 8.7 oz (70.1 kg)   SpO2: 97% 98%  98%    Intake/Output from previous day:  Intake/Output Summary (Last 24 hours) at 03/25/14 0647 Last data filed at 03/25/14 0300  Gross per 24 hour  Intake    720 ml  Output     75 ml  Net    645 ml    Physical Exam: Physical exam: Well-developed well-nourished in no acute distress.  Skin is warm and dry.  HEENT is normal.  Neck is supple.  Chest is clear to auscultation with normal expansion.  Cardiovascular exam is regular rate and rhythm.  Abdominal exam nontender or distended. No masses palpated. Extremities show no edema. neuro grossly intact    Lab Results: Basic Metabolic Panel:  Recent Labs  03/24/14 0333 03/25/14 0332  NA 141 137  K 3.8 3.9  CL 101 100  CO2 25 25  GLUCOSE 95 99  BUN 25* 26*  CREATININE 1.30* 1.28*  CALCIUM 9.8 9.4   CBC:  Recent Labs  03/24/14 0333 03/25/14 0332  WBC 6.0 5.2  HGB 9.8* 9.8*  HCT 31.3* 31.0*  MCV 84.8 84.0  PLT 193 209   Cardiac Enzymes:  Recent Labs  03/23/14 2320 03/24/14 0333 03/24/14 1035  TROPONINI <0.30 <0.30 <0.30     Assessment/Plan:  1 chest pain-symptoms are atypical. Symptoms resolvedd. Admission electrocardiogram with no ST changes and enzymes negative. Recent negative nuclear study. Plan medical therapy for now. 2 hypertensive urgency-blood pressure improved. Continue present meds. Patient can be DCed from a cardiac standpoint and followup with Dr. Gwenlyn Found. 3 coronary artery disease-continue aspirin. Apparently intolerant to higher dose statins. 4 normocytic anemia-further workup per primary care. 5 renal insufficiency-chronicity unclear. No  baseline labs. Further evaluation per primary care. 6 history of atrial fibrillation-continue amiodarone and aspirin. Coumadin discontinued previously secondary to GI bleed by report.   Kirk Ruths 03/25/2014, 6:47 AM

## 2014-03-25 NOTE — Discharge Summary (Signed)
Physician Discharge Summary  Jeanne Hart FXT:[id number] DOB: 23-Aug-1927 DOA: 03/23/2014  PCP: Thressa Sheller, MD  Admit date: 03/23/2014 Discharge date: 03/25/2014  Time spent: 40* minutes  Recommendations for Outpatient Follow-up:  1. Follow up PCP in one week to follow up urine culture results  Discharge Diagnoses:  Principal Problem:   Unstable angina Active Problems:   Coronary artery disease   Dyslipidemia   Paroxysmal atrial fibrillation   Hypertensive urgency   Anemia   Chest pain at rest   Hypothyroidism   Discharge Condition: Stable  Diet recommendation: Low salt diet  Filed Weights   03/23/14 2104 03/24/14 0400 03/25/14 0336  Weight: 71.9 kg (158 lb 8.2 oz) 70 kg (154 lb 5.2 oz) 70.1 kg (154 lb 8.7 oz)    History of present illness:  80 year old female with past medical hx of coronary artery disease status post 4 stents placed in past, hypertension, paroxysmal A. fib on amiodarone, hypothyroidism, dyslipidemia, strong family history of coronary artery disease, anemia, chronic angina who presented to the ED with chest pain since this morning. Patient reports doing her household chores when she started having chest pressure on her left side and underneath the left breast which was 6/10 in intensity and nonradiating. The chest pain was persistent. He should and did not take any nitroglycerin and she had run out of it. She reports feeling nauseous but denied any vomiting. The pain was persistent and lasted onto she came to the ED. Patient reports off and on chest pain with underlying angina. She is able to ambulate in and around the house and chronically uses 3 pillows for sleep. She denies any worsening shortness of breath or leg edema.  Patient denies headache, dizziness, fever, chills, vomiting, palpitations, SOB, abdominal pain, bowel or urinary symptoms. Denies change in weight or appetite. Denies any change in her medications recently. She reports taking her aspirin and  blood pressure medications today.   Hospital Course:   1-Chest pain:  Patient was admitted with chest pain, seen by cardiology. At this time chest pain has resolved, and symptoms are atypical as per cardiology. Medical therapy is recommended. Patient will continue take aspirin and when necessary nitroglycerin. Patient also started on Imdur. Cardiac enzymes are negative.   Accelerated hypertension/ Hypertensive urgency  -She was started on nitroglycerin drip. PRN hydralazine.  -Started on Imdur.  -Atenolol on hold due to bradycardia.  -Started on Norvasc.  -Patient will follow up with cardiology for medication adjustments  Coronary artery disease  Status post PCI X2 weeks 4 stents placed in the past. Stress test in 07/2013 was normal.  Continue aspirin, and statin.   Paroxysmal atrial fibrillation  Rate controlled and in sinus rhythm on amiodarone  - Patient not on Coumadin due to GI bleed Continue aspirin  Anemia  No baseline available. She was seen by GI about 6 months back and as per the notes she had normal EGD and colonoscopy in 2012. Stool for occult blood in the ED was positive.  Hb stable at 9.8.  Called and discussed with GI. Patient will followup GI next week for possible EGD.  Hypothyroidism  Continue Synthroid   ? AKI/ CKD  Patient has underlying possible C. KD Will need evaluation as outpatient by nephrology UA shows hematuria and WBCs  ? UTI Patient has abnormal UA with microscopic hematuria, and leukocytes Urine culture was not obtained yesterday Will order urine culture, and start empiric Cipro 250 twice a day for 3 days PCP can follow the urine  culture results Patient wants to leave and does not want to stay for the urine culture results  GERD with hx of hiatal hernia  Continue protonix     Procedures:  *None  Consultations:  *Cardiology  Discharge Exam: Filed Vitals:   03/25/14 0918  BP: 117/55  Pulse: 62  Temp:   Resp:     General:  Appears in no acute distress Cardiovascular: S1-S2 regular Respiratory: Clear bilaterally  Discharge Instructions You were cared for by a hospitalist during your hospital stay. If you have any questions about your discharge medications or the care you received while you were in the hospital after you are discharged, you can call the unit and asked to speak with the hospitalist on call if the hospitalist that took care of you is not available. Once you are discharged, your primary care physician will handle any further medical issues. Please note that NO REFILLS for any discharge medications will be authorized once you are discharged, as it is imperative that you return to your primary care physician (or establish a relationship with a primary care physician if you do not have one) for your aftercare needs so that they can reassess your need for medications and monitor your lab values.  Discharge Instructions   Call MD for:  severe uncontrolled pain    Complete by:  As directed      Diet - low sodium heart healthy    Complete by:  As directed      Discharge instructions    Complete by:  As directed   Call PCP in three days to check the lab results of urine culture     Increase activity slowly    Complete by:  As directed             Medication List         amiodarone 200 MG tablet  Commonly known as:  PACERONE  Take 200 mg by mouth daily.     amLODipine 5 MG tablet  Commonly known as:  NORVASC  Take 1 tablet (5 mg total) by mouth daily.     aspirin 325 MG EC tablet  Take 325 mg by mouth daily.     atenolol 25 MG tablet  Commonly known as:  TENORMIN  Take 25 mg by mouth daily.     ciprofloxacin 250 MG tablet  Commonly known as:  CIPRO  Take 1 tablet (250 mg total) by mouth 2 (two) times daily.     ipratropium 0.03 % nasal spray  Commonly known as:  ATROVENT  Place 1 spray into the nose as needed for rhinitis.     isosorbide mononitrate 30 MG 24 hr tablet  Commonly known  as:  IMDUR  Take 1 tablet (30 mg total) by mouth daily.     levothyroxine 100 MCG tablet  Commonly known as:  SYNTHROID, LEVOTHROID  Take 100 mcg by mouth daily before breakfast.     multivitamin capsule  Take 1 capsule by mouth daily.     nitroGLYCERIN 0.4 MG SL tablet  Commonly known as:  NITROSTAT  Place 0.4 mg under the tongue every 5 (five) minutes as needed for chest pain.     pantoprazole 40 MG tablet  Commonly known as:  PROTONIX  Take 40 mg by mouth daily as needed (reflux.).     pravastatin 10 MG tablet  Commonly known as:  PRAVACHOL  Take 10 mg by mouth daily.     VITAMIN C PO  Take  by mouth.     VITAMIN D PO  Take by mouth.     zolpidem 12.5 MG CR tablet  Commonly known as:  AMBIEN CR  Take 12.5 mg by mouth every other day.       Allergies  Allergen Reactions  . Codeine     Post-surgical reaction of unknown type.  . Morphine And Related     Unknown.    Marland Kitchen Nexium [Esomeprazole]     Aggravates IBS.   Marland Kitchen Penicillins     Unknown.  . Statins     Had jaundice as child.  Does tolerate current statin therapy.    . Sulfa Antibiotics     Unknown childhood reaction.         Follow-up Information   Follow up with ZEHR, JESSICA D., PA-C On 04/03/2014. (10:00 am)    Specialty:  Gastroenterology   Contact information:   [place] [address] [phone]       Follow up with Thressa Sheller, MD In 1 week. (Call for the results of urine culture)    Specialty:  Internal Medicine   Contact information:   [address] [place] [address] [phone]        The results of significant diagnostics from this hospitalization (including imaging, microbiology, ancillary and laboratory) are listed below for reference.    Significant Diagnostic Studies: Dg Chest Port 1 View  03/23/2014   CLINICAL DATA:  Chest pain.  EXAM: PORTABLE CHEST - 1 VIEW  COMPARISON:  None.  FINDINGS: The lungs are clear. Heart size is mildly enlarged.  No pneumothorax or pleural effusion. No focal bony abnormality.  IMPRESSION: Cardiomegaly without acute disease.   Electronically Signed   By: Inge Rise M.D.   On: 03/23/2014 17:06    Microbiology: Recent Results (from the past 240 hour(s))  MRSA PCR SCREENING     Status: None   Collection Time    03/23/14 10:16 PM      Result Value Ref Range Status   MRSA by PCR NEGATIVE  NEGATIVE Final   Comment:            The GeneXpert MRSA Assay (FDA     approved for NASAL specimens     only), is one component of a     comprehensive MRSA colonization     surveillance program. It is not     intended to diagnose MRSA     infection nor to guide or     monitor treatment for     MRSA infections.     Labs: Basic Metabolic Panel:  Recent Labs Lab 03/23/14 1627 03/24/14 0333 03/25/14 0332  NA 138 141 137  K 4.3 3.8 3.9  CL 104 101 100  CO2 21 25 25   GLUCOSE 114* 95 99  BUN 24* 25* 26*  CREATININE 1.26* 1.30* 1.28*  CALCIUM 9.4 9.8 9.4   Liver Function Tests: No results found for this basename: AST, ALT, ALKPHOS, BILITOT, PROT, ALBUMIN,  in the last 168 hours No results found for this basename: LIPASE, AMYLASE,  in the last 168 hours No results found for this basename: AMMONIA,  in the last 168 hours CBC:  Recent Labs Lab 03/23/14 1627 03/24/14 0333 03/25/14 0332  WBC 4.6 6.0 5.2  HGB 9.4* 9.8* 9.8*  HCT 29.8* 31.3* 31.0*  MCV 85.1 84.8 84.0  PLT 165 193 209   Cardiac Enzymes:  Recent Labs Lab 03/23/14 2320 03/24/14 0333 03/24/14 1035  TROPONINI <  0.30 <0.30 <0.30   BNP: BNP (last 3 results)  Recent Labs  03/23/14 1627  PROBNP 1245.0*   CBG: No results found for this basename: GLUCAP,  in the last 168 hours     Signed:  LAMA,GAGAN S  [facility] 03/25/2014, 10:22 AM

## 2014-03-26 LAB — URINE CULTURE: Colony Count: 40000

## 2014-03-30 DIAGNOSIS — H3560 Retinal hemorrhage, unspecified eye: Secondary | ICD-10-CM | POA: Diagnosis not present

## 2014-03-30 DIAGNOSIS — H35039 Hypertensive retinopathy, unspecified eye: Secondary | ICD-10-CM | POA: Diagnosis not present

## 2014-03-30 DIAGNOSIS — H524 Presbyopia: Secondary | ICD-10-CM | POA: Diagnosis not present

## 2014-03-30 DIAGNOSIS — I708 Atherosclerosis of other arteries: Secondary | ICD-10-CM | POA: Diagnosis not present

## 2014-03-31 DIAGNOSIS — H3560 Retinal hemorrhage, unspecified eye: Secondary | ICD-10-CM | POA: Diagnosis not present

## 2014-04-01 DIAGNOSIS — I2510 Atherosclerotic heart disease of native coronary artery without angina pectoris: Secondary | ICD-10-CM | POA: Diagnosis not present

## 2014-04-01 DIAGNOSIS — E039 Hypothyroidism, unspecified: Secondary | ICD-10-CM | POA: Diagnosis not present

## 2014-04-01 DIAGNOSIS — E785 Hyperlipidemia, unspecified: Secondary | ICD-10-CM | POA: Diagnosis not present

## 2014-04-01 DIAGNOSIS — I10 Essential (primary) hypertension: Secondary | ICD-10-CM | POA: Diagnosis not present

## 2014-04-03 ENCOUNTER — Encounter: Payer: Self-pay | Admitting: Gastroenterology

## 2014-04-03 ENCOUNTER — Ambulatory Visit (INDEPENDENT_AMBULATORY_CARE_PROVIDER_SITE_OTHER): Payer: Medicare Other | Admitting: Gastroenterology

## 2014-04-03 ENCOUNTER — Other Ambulatory Visit (INDEPENDENT_AMBULATORY_CARE_PROVIDER_SITE_OTHER): Payer: Medicare Other

## 2014-04-03 VITALS — BP 120/70 | HR 62 | Ht 64.25 in | Wt 160.8 lb

## 2014-04-03 DIAGNOSIS — R195 Other fecal abnormalities: Secondary | ICD-10-CM | POA: Insufficient documentation

## 2014-04-03 DIAGNOSIS — R197 Diarrhea, unspecified: Secondary | ICD-10-CM | POA: Diagnosis not present

## 2014-04-03 DIAGNOSIS — D649 Anemia, unspecified: Secondary | ICD-10-CM | POA: Diagnosis not present

## 2014-04-03 DIAGNOSIS — I200 Unstable angina: Secondary | ICD-10-CM

## 2014-04-03 LAB — IBC PANEL
Iron: 18 ug/dL — ABNORMAL LOW (ref 42–145)
SATURATION RATIOS: 4.2 % — AB (ref 20.0–50.0)
TRANSFERRIN: 308.3 mg/dL (ref 212.0–360.0)

## 2014-04-03 LAB — IGA: IgA: 431 mg/dL — ABNORMAL HIGH (ref 68–378)

## 2014-04-03 LAB — FERRITIN: Ferritin: 11.2 ng/mL (ref 10.0–291.0)

## 2014-04-03 NOTE — Progress Notes (Signed)
i agree with the plan noted above

## 2014-04-03 NOTE — Patient Instructions (Signed)
[facility] will contact you regarding mailing you your kit for testing. [facility]'s phone number is [phone]  Your physician has requested that you go to the basement for the following lab work before leaving today: TTG IGA Ferritin  IBC Panel

## 2014-04-03 NOTE — Progress Notes (Signed)
     04/03/2014 Jeanne Hart [id number] [birth year]   History of Present Illness:  This is a pleasant 80 year old female who is recently known to Dr. Ardis Hughs. She was here in December 2014 to establish care in [place] since she had relocated from [place] to live with her grandson. Dr. Ardis Hughs had obtained and reviewed her previous endoscopy records. She had an EGD in April 2012 with mild esophagitis and a colonoscopy at the same time that showed diverticulosis. Random biopsies of the colon were normal. She was diagnosed with IBS at the time of those procedures.  She presents to our office today with her grandson after recent hospitalization for complaints of chest pain. She underwent cardiac evaluation and was diagnosed with unstable angina. She also had hypertensive urgency that was managed as well and was started on Cipro for urinary tract infection. While she was there she was found to be anemic with a hemoglobin in the 9 g range. We do not have any other baseline for comparison in that regards. She was hemocculted and tested positive on one occasion for that. She denies seeing any blood in her stool and denies any dark black stools. She does report with her irritable bowel that she has frequent issues with diarrhea. She takes pantoprazole 40 mg every day for upper GI symptoms and esophagitis that she was previously found to have on endoscopy.  She says the medication controls her reflux symptoms well.     Current Medications, Allergies, Past Medical History, Past Surgical History, Family History and Social History were reviewed in [employer] record.   Physical Exam: BP 120/70  Pulse 62  Ht 5' 4.25" (1.632 m)  Wt 160 lb 12.8 oz (72.938 kg)  BMI 27.39 kg/m2 General: Well developed white female in no acute distress Head: Normocephalic and atraumatic Eyes:  Sclerae anicteric, conjunctiva pink  Ears: Normal auditory acuity Lungs: Clear throughout to  auscultation Heart: Regular rate and rhythm Abdomen: Soft, non-distended.  Normal bowel sounds.  Mild diffuse TTP without R/R/G. Musculoskeletal: Symmetrical with no gross deformities  Extremities: No edema  Neurological: Alert oriented x 4, grossly non-focal Psychological:  Alert and cooperative. Normal mood and affect  Assessment and Recommendations: -Anemia/heme positive stool (x 1):  Normocytic with MCV of 84.  Will check iron studies to confirm and see if she is iron deficient.  Will check celiac labs as well to rule that out as a cause of her anemia and diarrhea.  No overt GI bleeding. -Diarrhea:  Diagnosed with IBS in the past.   -GERD/esophagitis:  On pantoprazole 40 mg daily, which controls her symptoms well. -Cardiac issues including recent unstable angina and hypertensive urgency:  Addressed during recent hospitalization.  *Discussed with Dr. Ardis Hughs who suggesting performed Cologuard study instead repeating any procedures at this time.

## 2014-04-06 ENCOUNTER — Other Ambulatory Visit: Payer: Self-pay | Admitting: *Deleted

## 2014-04-06 LAB — T-TRANSGLUTAMINASE (TTG) IGG: Tissue Transglut Ab: <2 U/mL (ref 0–5)

## 2014-04-06 MED ORDER — FERROUS SULFATE 325 (65 FE) MG PO TABS: ORAL_TABLET | ORAL | Status: DC

## 2014-04-15 ENCOUNTER — Telehealth: Payer: Self-pay | Admitting: Gastroenterology

## 2014-04-15 NOTE — Telephone Encounter (Signed)
Spoke with Amy at [employer]  Per Amy she just wanted to verify the ICD code Code verified

## 2014-04-20 DIAGNOSIS — Z1211 Encounter for screening for malignant neoplasm of colon: Secondary | ICD-10-CM | POA: Diagnosis not present

## 2014-04-21 LAB — COLOGUARD

## 2014-05-13 ENCOUNTER — Telehealth: Payer: Self-pay | Admitting: *Deleted

## 2014-05-13 ENCOUNTER — Other Ambulatory Visit: Payer: Self-pay | Admitting: *Deleted

## 2014-05-13 NOTE — Telephone Encounter (Signed)
Message copied by Hulan Saas on Wed May 13, 2014 10:14 AM ------      Message from: Alonza Bogus D      Created: Wed May 13, 2014  9:21 AM      Regarding: Cologuard       Please let patient know that cologuard results were negative.  We do not need to do anything else from our standpoint for now.              Thank you,            Jess ------

## 2014-05-13 NOTE — Progress Notes (Signed)
Error

## 2014-05-13 NOTE — Telephone Encounter (Signed)
We received results for the Colog[facility], negative results.  An original order was not put in so I could not put the results in Edit/Enter results.

## 2014-05-13 NOTE — Telephone Encounter (Signed)
Patient given results and recommendations. 

## 2014-05-21 ENCOUNTER — Encounter: Payer: Self-pay | Admitting: Gastroenterology

## 2014-07-16 ENCOUNTER — Telehealth: Payer: Self-pay | Admitting: Cardiovascular Disease

## 2014-07-16 NOTE — Telephone Encounter (Signed)
Yes but sparingly, on a when necessary basis

## 2014-07-16 NOTE — Telephone Encounter (Signed)
Pt. Wants to know if its ok for her to take advil or aleve with her other meds

## 2014-07-16 NOTE — Telephone Encounter (Signed)
Care givers informed of Dr. Cardell Peach instructions concerning the use of aleve and advil

## 2014-07-16 NOTE — Telephone Encounter (Signed)
Jalene(daughter) called in wanting to know if it is ok for Jeanne Hart to take Advil or Aleve while on her other medications. Please call  Thank

## 2014-07-28 DIAGNOSIS — M25562 Pain in left knee: Secondary | ICD-10-CM | POA: Diagnosis not present

## 2014-08-28 ENCOUNTER — Ambulatory Visit: Payer: Medicare Other | Admitting: Cardiovascular Disease

## 2014-09-02 ENCOUNTER — Ambulatory Visit (INDEPENDENT_AMBULATORY_CARE_PROVIDER_SITE_OTHER): Payer: Medicare Other | Admitting: Cardiovascular Disease

## 2014-09-02 ENCOUNTER — Encounter: Payer: Self-pay | Admitting: Cardiovascular Disease

## 2014-09-02 ENCOUNTER — Ambulatory Visit (INDEPENDENT_AMBULATORY_CARE_PROVIDER_SITE_OTHER): Payer: Medicare Other | Admitting: *Deleted

## 2014-09-02 VITALS — BP 126/84 | HR 56 | Ht 65.0 in | Wt 161.0 lb

## 2014-09-02 DIAGNOSIS — I10 Essential (primary) hypertension: Secondary | ICD-10-CM | POA: Diagnosis not present

## 2014-09-02 DIAGNOSIS — I480 Paroxysmal atrial fibrillation: Secondary | ICD-10-CM | POA: Diagnosis not present

## 2014-09-02 DIAGNOSIS — Z23 Encounter for immunization: Secondary | ICD-10-CM

## 2014-09-02 DIAGNOSIS — I2510 Atherosclerotic heart disease of native coronary artery without angina pectoris: Secondary | ICD-10-CM | POA: Diagnosis not present

## 2014-09-02 DIAGNOSIS — I200 Unstable angina: Secondary | ICD-10-CM | POA: Diagnosis not present

## 2014-09-02 DIAGNOSIS — E785 Hyperlipidemia, unspecified: Secondary | ICD-10-CM | POA: Diagnosis not present

## 2014-09-02 MED ORDER — NITROGLYCERIN 0.4 MG/SPRAY TL SOLN: 1.0000 | Status: DC | PRN

## 2014-09-02 NOTE — Assessment & Plan Note (Signed)
History of CAD status post stenting at [facility] as well as in [place]. She had a negative nuclear stress test December 2012. She was admitted with chest pain rule out myocardial infarction in June of this year but has had no further episodes of chest pain.

## 2014-09-02 NOTE — Assessment & Plan Note (Signed)
History of hypertension with blood pressure measured today in the office on 126/84. She is on atenolol. Maintain current medications at current dosing

## 2014-09-02 NOTE — Assessment & Plan Note (Signed)
History of paroxysmal A. Fib fibrillation maintaining sinus rhythm on amiodarone. Continue current medication at current dosing

## 2014-09-02 NOTE — Assessment & Plan Note (Signed)
History of hyperlipidemia on pravastatin 10 mg daily followed by her primary care physician. We will obtain copies of her most recent labs and continue current medications

## 2014-09-02 NOTE — Progress Notes (Signed)
09/02/2014 Jeanne Hart   11-12-1926  [id number]  Primary Physician Thressa Sheller, MD Primary Cardiologist: Lorretta Harp MD [place], FSCAI   HPI:  Jeanne Hart is a 80y.o. female a history of coronary artery disease, hypertension, dyslipidemia, IBS hypothyroidism, strong family history of heart disease, paroxysmal A. show fibrillation on amiodarone patient moved to [place] from [place] this past winter with closer to her family. She's had a total of 4 stents placed to her coronary arteries. 3 of which are at [facility] and the other one at [facility]. Her last nuclear stress test was December 2012 was considered a low-risk test the ejection fraction 76%.  Patient presents today secondary to heart palpitations and lower extremity edema or states that she gets episodes of palpitations once and awhile, which last a short period of time, maybe several minutes and resolve spontaneously. She also reports being feeling weak in the legs. She's had 2 episodes of chest pain which she reports as tightness in 4 of 10 in intensity, with maybe a little bit of diaphoresis. When she had her first episode the other day he took one sublingual nitroglycerin glycerin which would provide relief after a few minutes. She had an episode this morning which resolved on its own. She also reports dyspnea on exertion when she walks on a hot day and she does a lot of walking apparently.  Since I saw her one year ago she has been admitted to [facility] with chest pain, rule out myocardial infarction. It was thought that her pain was noncardiac. Her enzymes were negative. Since that time she has been relatively asymptomatic.    Current Outpatient Prescriptions  Medication Sig Dispense Refill  . amiodarone (PACERONE) 200 MG tablet Take 200 mg by mouth daily.    . Ascorbic Acid (VITAMIN C PO) Take by mouth.    Marland Kitchen aspirin 325 MG EC tablet Take 325 mg by mouth daily.    Marland Kitchen atenolol (TENORMIN) 25 MG  tablet Take 25 mg by mouth daily.     . Cholecalciferol (VITAMIN D PO) Take by mouth.    . ferrous sulfate 325 (65 FE) MG tablet Take one po BID 60 tablet 3  . ipratropium (ATROVENT) 0.03 % nasal spray Place 1 spray into the nose as needed for rhinitis.     Marland Kitchen levothyroxine (SYNTHROID, LEVOTHROID) 100 MCG tablet Take 100 mcg by mouth daily before breakfast.    . Multiple Vitamin (MULTIVITAMIN) capsule Take 1 capsule by mouth daily.    . pantoprazole (PROTONIX) 40 MG tablet Take 40 mg by mouth daily as needed (reflux.).     Marland Kitchen pravastatin (PRAVACHOL) 10 MG tablet Take 10 mg by mouth daily.     Marland Kitchen zolpidem (AMBIEN CR) 12.5 MG CR tablet Take 12.5 mg by mouth every other day.    . [DISCONTINUED] nitroGLYCERIN (NITROSTAT) 0.4 MG SL tablet Place 0.4 mg under the tongue every 5 (five) minutes as needed for chest pain.     No current facility-administered medications for this visit.    Allergies  Allergen Reactions  . Codeine     Post-surgical reaction of unknown type.  . Morphine And Related     Unknown.    Marland Kitchen Nexium [Esomeprazole]     Aggravates IBS.   Marland Kitchen Penicillins     Unknown.  . Statins     Had jaundice as child.  Does tolerate current statin therapy.    . Sulfa Antibiotics     Unknown childhood  reaction.      History   Social History  . Marital Status: Widowed    Spouse Name: N/A    Number of Children: 3  . Years of Education: N/A   Occupational History  . Sales/Office-retired    Social History Main Topics  . Smoking status: Never Smoker   . Smokeless tobacco: Never Used  . Alcohol Use: No  . Drug Use: No  . Sexual Activity: Not on file   Other Topics Concern  . Not on file   Social History Narrative     Review of Systems: General: negative for chills, fever, night sweats or weight changes.  Cardiovascular: negative for chest pain, dyspnea on exertion, edema, orthopnea, palpitations, paroxysmal nocturnal dyspnea or shortness of breath Dermatological: negative for  rash Respiratory: negative for cough or wheezing Urologic: negative for hematuria Abdominal: negative for nausea, vomiting, diarrhea, bright red blood per rectum, melena, or hematemesis Neurologic: negative for visual changes, syncope, or dizziness All other systems reviewed and are otherwise negative except as noted above.    Blood pressure 126/84, pulse 56, height 5\' 5"  (1.651 m), weight 161 lb (73.029 kg).  General appearance: alert and no distress Neck: no adenopathy, no carotid bruit, no JVD, supple, symmetrical, trachea midline and thyroid not enlarged, symmetric, no tenderness/mass/nodules Lungs: clear to auscultation bilaterally Heart: regular rate and rhythm, S1, S2 normal, no murmur, click, rub or gallop Extremities: extremities normal, atraumatic, no cyanosis or edema  EKG sinus bradycardia 56 without ST or T-wave changes. I personally reviewed his EKG  ASSESSMENT AND PLAN:   Coronary artery disease History of CAD status post stenting at [facility] as well as in [place]. She had a negative nuclear stress test December 2012. She was admitted with chest pain rule out myocardial infarction in June of this year but has had no further episodes of chest pain.  Essential hypertension History of hypertension with blood pressure measured today in the office on 126/84. She is on atenolol. Maintain current medications at current dosing  Paroxysmal atrial fibrillation History of paroxysmal A. Fib fibrillation maintaining sinus rhythm on amiodarone. Continue current medication at current dosing  Dyslipidemia History of hyperlipidemia on pravastatin 10 mg daily followed by her primary care physician. We will obtain copies of her most recent labs and continue current medications      Lorretta Harp MD [facility], [facility] 09/02/2014 11:31 AM

## 2014-09-02 NOTE — Patient Instructions (Signed)
Your physician wants you to follow-up in: 1 year with Dr Berry. You will receive a reminder letter in the mail two months in advance. If you don't receive a letter, please call our office to schedule the follow-up appointment.  

## 2014-09-07 ENCOUNTER — Other Ambulatory Visit: Payer: Self-pay | Admitting: Cardiovascular Disease

## 2014-09-07 NOTE — Telephone Encounter (Signed)
Rx was sent to pharmacy electronically. 

## 2014-09-08 ENCOUNTER — Emergency Department (HOSPITAL_COMMUNITY)
Admission: EM | Admit: 2014-09-08 | Discharge: 2014-09-09 | Disposition: A | Discharge status: Home / Self Care | Payer: Medicare Other | Attending: Emergency Medicine | Admitting: Emergency Medicine

## 2014-09-08 ENCOUNTER — Encounter (HOSPITAL_COMMUNITY): Payer: Self-pay | Admitting: Emergency Medicine

## 2014-09-08 ENCOUNTER — Emergency Department (HOSPITAL_COMMUNITY): Payer: Medicare Other

## 2014-09-08 DIAGNOSIS — R079 Chest pain, unspecified: Secondary | ICD-10-CM | POA: Diagnosis not present

## 2014-09-08 DIAGNOSIS — I2510 Atherosclerotic heart disease of native coronary artery without angina pectoris: Secondary | ICD-10-CM | POA: Diagnosis not present

## 2014-09-08 DIAGNOSIS — K219 Gastro-esophageal reflux disease without esophagitis: Secondary | ICD-10-CM | POA: Insufficient documentation

## 2014-09-08 DIAGNOSIS — Z955 Presence of coronary angioplasty implant and graft: Secondary | ICD-10-CM | POA: Diagnosis not present

## 2014-09-08 DIAGNOSIS — J9809 Other diseases of bronchus, not elsewhere classified: Secondary | ICD-10-CM | POA: Diagnosis not present

## 2014-09-08 DIAGNOSIS — G4700 Insomnia, unspecified: Secondary | ICD-10-CM | POA: Insufficient documentation

## 2014-09-08 DIAGNOSIS — Z7982 Long term (current) use of aspirin: Secondary | ICD-10-CM | POA: Insufficient documentation

## 2014-09-08 DIAGNOSIS — I10 Essential (primary) hypertension: Secondary | ICD-10-CM | POA: Diagnosis not present

## 2014-09-08 DIAGNOSIS — Z8639 Personal history of other endocrine, nutritional and metabolic disease: Secondary | ICD-10-CM | POA: Diagnosis not present

## 2014-09-08 DIAGNOSIS — I517 Cardiomegaly: Secondary | ICD-10-CM | POA: Diagnosis not present

## 2014-09-08 DIAGNOSIS — R0789 Other chest pain: Secondary | ICD-10-CM

## 2014-09-08 DIAGNOSIS — Z880 Allergy status to penicillin: Secondary | ICD-10-CM | POA: Diagnosis not present

## 2014-09-08 DIAGNOSIS — Z79899 Other long term (current) drug therapy: Secondary | ICD-10-CM | POA: Insufficient documentation

## 2014-09-08 LAB — BASIC METABOLIC PANEL
ANION GAP: 15 (ref 5–15)
BUN: 21 mg/dL (ref 6–23)
CHLORIDE: 104 meq/L (ref 96–112)
CO2: 20 mEq/L (ref 19–32)
Calcium: 9.3 mg/dL (ref 8.4–10.5)
Creatinine, Ser: 1.22 mg/dL — ABNORMAL HIGH (ref 0.50–1.10)
GFR calc non Af Amer: 39 mL/min — ABNORMAL LOW (ref >90)
GFR, EST AFRICAN AMERICAN: 45 mL/min — AB (ref >90)
Glucose, Bld: 104 mg/dL — ABNORMAL HIGH (ref 70–99)
Potassium: 4.6 mEq/L (ref 3.7–5.3)
Sodium: 139 mEq/L (ref 137–147)

## 2014-09-08 LAB — CBC
HCT: 36 % (ref 36.0–46.0)
Hemoglobin: 11.8 g/dL — ABNORMAL LOW (ref 12.0–15.0)
MCH: 28.3 pg (ref 26.0–34.0)
MCHC: 32.8 g/dL (ref 30.0–36.0)
MCV: 86.3 fL (ref 78.0–100.0)
PLATELETS: 180 10*3/uL (ref 150–400)
RBC: 4.17 MIL/uL (ref 3.87–5.11)
RDW: 19.2 % — AB (ref 11.5–15.5)
WBC: 5.7 10*3/uL (ref 4.0–10.5)

## 2014-09-08 LAB — I-STAT TROPONIN, ED: Troponin i, poc: 0 ng/mL (ref 0.00–0.08)

## 2014-09-08 NOTE — ED Notes (Signed)
Per EMS: Pt began experiencing chest pain, 8/10.  Pt took friend's nitro x2 with no relief of chest pain, no head ache.  Pt has also been out of her Amiodarone for 2 days.  When she received her prescription today and began re-taking it today.  Pt also had nitro x2 en route.  4 stents placed, no hx of heart attack.  Patient also taken four 81mg  aspirin.

## 2014-09-08 NOTE — Consult Note (Signed)
Patient ID: Jeanne Hart MRN: [id number], DOB/AGE: July 10, 1927   Consult date: 09/08/2014   Primary Physician: Thressa Sheller, MD Primary Cardiologist: Quay Burow, MD  Pt. Profile:  80F with CAD s/p PCI, HTN, HLD, IBS, hypothyroidism, pAF (on amio, off [place] d/t GIB) who presents with chest pain and hypertension.  Problem List  Past Medical History  Diagnosis Date  . Insomnia     chronic  . IBS (irritable bowel syndrome)   . HTN (hypertension)   . Dyslipidemia   . Family history of cardiovascular disease   . PAF (paroxysmal atrial fibrillation)     coumadin was stopped secondary to GI Bleed, on amiodarone; monitor march 2014-NSR  . CAD (coronary artery disease)     PCI x 2  . Hypothyroidism   . Chest pain   . Dyspnea on exertion   . GERD (gastroesophageal reflux disease)   . Diverticulitis     Past Surgical History  Procedure Laterality Date  . Coronary angioplasty with stent placement  01/05/2011    2.5x76mm Promus DES stent to RCA post dilated to 2.75x27mm   . Coronary angioplasty with stent placement  ?    3 stents per patient  . Appendectomy    . Cholecystectomy    . Abdominal hysterectomy       Allergies  Allergies  Allergen Reactions  . Nexium [Esomeprazole] Other (See Comments)    Aggravates IBS.   . Statins Other (See Comments)    Had jaundice as child.  Does tolerate current statin therapy.    . Codeine Other (See Comments)    Post-surgical reaction of unknown type.  . Morphine And Related Other (See Comments)    Unknown.    Marland Kitchen Penicillins Other (See Comments)    Unknown.  . Sulfa Antibiotics Other (See Comments)    Unknown childhood reaction.      HPI 80F with CAD s/p PCI, HTN, HLD, IBS, hypothyroidism, pAF (on amio, off [place] d/t GIB) who presents with chest pain and hypertension.  Jeanne Hart has been largely in her USOH recently except for a deep, non-productive cough for the past 3 weeks.  She states that today, while playing dominos with  friends, she developed a "chest pressure...once in a while a pain." 7/10 at its most severe. Reminded her somewhat of her angina. Her friend, who had apparently had an MI and PCI recently, gave her 3 SL NTG wihtout relieved and - without Jeanne Hart knowing - "rusked out and called a [employer]." The entire episode lasted about 5 minutes.  In the ED, she was hypertensive to 184/67 with HR in the upper 50s. Labs notable for K 4.6, Ct 1.22, POC TnI 0.00. CXR was without acute process. On my interview, she reported that with each cough, the chest pain was brought back on briefly. Of note, Jeanne Hart had previous been on norvasc 5 and imdur 30 (in addition to atenolol) for BP control. She self discontinued the norvasc and imdur because she felt she was on too many meds.   Home Medications  Prior to Admission medications   Medication Sig Start Date End Date Taking? Authorizing Provider  amiodarone (PACERONE) 200 MG tablet TAKE 1 TABLET BY MOUTH EVERY DAY 09/07/14  Yes Lorretta Harp, MD  Ascorbic Acid (VITAMIN C PO) Take 1 tablet by mouth daily.    Yes Historical Provider, MD  aspirin 325 MG EC tablet Take 325 mg by mouth daily.   Yes Historical Provider, MD  aspirin  81 MG chewable tablet Chew 324 mg by mouth once.   Yes Historical Provider, MD  atenolol (TENORMIN) 25 MG tablet Take 25 mg by mouth daily.  02/28/14  Yes Historical Provider, MD  Cholecalciferol (VITAMIN D PO) Take 1 tablet by mouth daily.    Yes Historical Provider, MD  ferrous sulfate 325 (65 FE) MG tablet Take one po BID Patient taking differently: Take 325 mg by mouth. Take one po BID 04/06/14  Yes Jessica D. Zehr, PA-C  levothyroxine (SYNTHROID, LEVOTHROID) 100 MCG tablet Take 100 mcg by mouth daily before breakfast.   Yes Historical Provider, MD  Multiple Vitamin (MULTIVITAMIN) capsule Take 1 capsule by mouth daily.   Yes Historical Provider, MD  pantoprazole (PROTONIX) 40 MG tablet Take 40 mg by mouth daily.    Yes Historical  Provider, MD  Polyethyl Glycol-Propyl Glycol (SYSTANE) 0.4-0.3 % SOLN Apply 2 drops to eye at bedtime.   Yes Historical Provider, MD  pravastatin (PRAVACHOL) 10 MG tablet Take 10 mg by mouth daily.  09/11/13  Yes Historical Provider, MD  zolpidem (AMBIEN CR) 12.5 MG CR tablet Take 12.5 mg by mouth every other day.   Yes Historical Provider, MD    Family History  Family History  Problem Relation Age of Onset  . Diabetes Maternal Aunt   . Diabetes Son   . Heart disease      Both sides of family  . Colon cancer Neg Hx     Social History  History   Social History  . Marital Status: Widowed    Spouse Name: N/A    Number of Children: 3  . Years of Education: N/A   Occupational History  . Sales/Office-retired    Social History Main Topics  . Smoking status: Never Smoker   . Smokeless tobacco: Never Used  . Alcohol Use: No  . Drug Use: No  . Sexual Activity: Not on file   Other Topics Concern  . Not on file   Social History Narrative     Review of Systems General:  No chills, fever, night sweats or weight changes.  Cardiovascular:  See HPI. Dermatological: No rash, lesions/masses Respiratory: +cough. No dyspnea Urologic: No hematuria, dysuria Abdominal:   No nausea, vomiting, diarrhea, bright red blood per rectum, melena, or hematemesis Neurologic:  No visual changes, wkns, changes in mental status. All other systems reviewed and are otherwise negative except as noted above.  Physical Exam  Blood pressure 172/61, pulse 58, resp. rate 11, SpO2 99 %.  General: Pleasant, NAD Psych: Normal affect. Neuro: Alert and oriented X 3. Moves all extremities spontaneously. HEENT: Normal  Neck: Supple without bruits or JVD. Lungs:  Resp regular and unlabored, CTA. Heart: RRR no s3, s4, or murmurs. Abdomen: Soft, non-tender, non-distended, BS + x 4.  Extremities: No clubbing, cyanosis or edema. DP/PT/Radials 2+ and equal bilaterally.  Labs  Troponin [facility] of Care  Test)  Recent Labs  09/08/14 2053  TROPIPOC 0.00   No results for input(s): CKTOTAL, CKMB, TROPONINI in the last 72 hours. Lab Results  Component Value Date   WBC 5.7 09/08/2014   HGB 11.8* 09/08/2014   HCT 36.0 09/08/2014   MCV 86.3 09/08/2014   PLT 180 09/08/2014    Recent Labs Lab 09/08/14 2045  NA 139  K 4.6  CL 104  CO2 20  BUN 21  CREATININE 1.22*  CALCIUM 9.3  GLUCOSE 104*   No results found for: CHOL, HDL, LDLCALC, TRIG No results found for: DDIMER  Radiology/Studies  Dg Chest 2 View  09/08/2014   CLINICAL DATA:  80 year old female with chest pain and cough. Initial encounter.  EXAM: CHEST  2 VIEW  COMPARISON:  03/23/2014 chest radiograph  FINDINGS: The cardiomediastinal silhouette is unremarkable.  Mild peribronchial thickening is unchanged.  There is no evidence of focal airspace disease, pulmonary edema, suspicious pulmonary nodule/mass, pleural effusion, or pneumothorax.  An anterior wedge compression fracture of T12 is age-indeterminate.  IMPRESSION: Cardiomegaly without evidence of acute cardiopulmonary disease.  T12 compression fracture -age indeterminate but most likely remote. Correlate with pain.   Electronically Signed   By: Hassan Rowan M.D.   On: 09/08/2014 22:58   Echocardiogram - 07/31/2013 - Left ventricle: The cavity size was at the upper limits of normal. Wall thickness was increased in a pattern of mild LVH. Systolic function was normal. The estimated ejection fraction was in the range of 55% to 60%. Wall motion was normal; there were no regional wall motion abnormalities. Features are consistent with a pseudonormal left ventricular filling pattern, with concomitant abnormal relaxation and increased filling pressure (grade 2 diastolic dysfunction). Doppler parameters are consistent with elevated mean left atrial filling pressure. - Aortic valve: Trileaflet; mildly thickened leaflets. - Mitral valve: Moderately calcified annulus affecting  the posterior annulus most significantly. Mild regurgitation. Valve area by pressure half-time: 2.12cm^2. - Left atrium: The atrium was moderately dilated. - Right ventricle: Systolic pressure was increased. - Right atrium: The atrium was mildly dilated. - Atrial septum: No defect or patent foramen ovale was identified. - Tricuspid valve: Moderate regurgitation. - Pulmonary arteries: PA peak pressure: 5mm Hg (S). Impressions:  - The right ventricular systolic pressure was increased consistent with mild pulmonary hypertension.  Lexiscan nuclear stress test 07/29/2013  Impression Exercise Capacity: Lexiscan with no exercise. BP Response: Normal blood pressure response. Clinical Symptoms: No significant symptoms noted. ECG Impression: No significant ECG changes with Lexiscan. Comparison with Prior Nuclear Study: No change from previous  nuclear study performed 2012. Overall Impression: Normal stress nuclear study.  ECG  09/08/14 SB at 76. Borderline prolonged QTc. 03/25/14 SB. Prolonged QTc at 535  ASSESSMENT AND PLAN  44F with CAD s/p PCI, HTN, HLD, IBS, hypothyroidism, pAF (on amio, off [place] d/t GIB) who presents with chest pain and hypertension. Chest pain is reproducible with cough and unlikely to be coronary related. A second troponin was checked and was also negative which is additionally reassuring.The degree of hypertension is concerning and likely exacerbated by the coughing induced chest pain. She needs to get back on her antihypertensive regimen and this can be safely done as an outpatient.  1. Please give 25mg  PO hydralazine to get her BP a bit lower in preparation for discharge from ED 2. Please prescribe norvasc 5mg  daily and imdur 30mg  daily 3. The patient has a f/u appointment this week with her PCP and can have a BP check then 4. Ms. [place] grandson will bring her home, stay with her tonight, and help make sure she gets her prescriptions filled in the AM 5. Ms. Vaccaro  indicated she can get her BP checked at the assisted living facility she lives at.    Tobi Bastos, MD 09/08/2014, 11:49 PM

## 2014-09-08 NOTE — ED Notes (Signed)
Pt sts she has had a cough for 2+ weeks and pain is worse with coughing.  Pt also sts she has had green sputum that resolved to clear a few days ago.

## 2014-09-08 NOTE — ED Provider Notes (Signed)
CSN: [id number]     Arrival date & time 09/08/14  2036 History   First MD Initiated Contact with Patient 09/08/14 2231     Chief Complaint  Patient presents with  . Chest Pain     (Consider location/radiation/quality/duration/timing/severity/associated sxs/prior Treatment) HPI   Jeanne Hart is a(n) 80 y.o. female who presents to the emergency department with chief complaint of chest pain. Patient has a past medical history of coronary artery disease with stent placement. She is followed by Dr. Gwenlyn Found. The patient has had 3 weeks of cough productive of sputum, which was originally [place] that has cleared up. The patient has had 2-3 days of left-sided and substernal chest pain which is worse with cough. Today she was playing dominoes with her friends when she started to feel some pressure on the left side of her chest. She has intermittent twinges of pain on the left. A friend of hers gave her 3 sublingual nitros prior to arrival. This did not affect her pain. She continues to have small twinges of pain on the left and her pain is still worse with cough. She denies nausea, vomiting, diaphoresis or shortness of breath.  Patient denies fevers, chills, history of smoking or asthma.   Past Medical History  Diagnosis Date  . Insomnia     chronic  . IBS (irritable bowel syndrome)   . HTN (hypertension)   . Dyslipidemia   . Family history of cardiovascular disease   . PAF (paroxysmal atrial fibrillation)     coumadin was stopped secondary to GI Bleed, on amiodarone; monitor march 2014-NSR  . CAD (coronary artery disease)     PCI x 2  . Hypothyroidism   . Chest pain   . Dyspnea on exertion   . GERD (gastroesophageal reflux disease)   . Diverticulitis    Past Surgical History  Procedure Laterality Date  . Coronary angioplasty with stent placement  01/05/2011    2.5x18mm Promus DES stent to RCA post dilated to 2.75x41mm   . Coronary angioplasty with stent placement  ?    3 stents per patient   . Appendectomy    . Cholecystectomy    . Abdominal hysterectomy     Family History  Problem Relation Age of Onset  . Diabetes Maternal Aunt   . Diabetes Son   . Heart disease      Both sides of family  . Colon cancer Neg Hx    History  Substance Use Topics  . Smoking status: Never Smoker   . Smokeless tobacco: Never Used  . Alcohol Use: No   OB History    No data available     Review of Systems  Ten systems reviewed and are negative for acute change, except as noted in the HPI.     Allergies  Nexium; Statins; Codeine; Morphine and related; Penicillins; and Sulfa antibiotics  Home Medications   Prior to Admission medications   Medication Sig Start Date End Date Taking? Authorizing Provider  amiodarone (PACERONE) 200 MG tablet TAKE 1 TABLET BY MOUTH EVERY DAY 09/07/14  Yes Lorretta Harp, MD  Ascorbic Acid (VITAMIN C PO) Take 1 tablet by mouth daily.    Yes Historical Provider, MD  aspirin 325 MG EC tablet Take 325 mg by mouth daily.   Yes Historical Provider, MD  aspirin 81 MG chewable tablet Chew 324 mg by mouth once.   Yes Historical Provider, MD  atenolol (TENORMIN) 25 MG tablet Take 25 mg by mouth daily.  02/28/14  Yes Historical Provider, MD  Cholecalciferol (VITAMIN D PO) Take 1 tablet by mouth daily.    Yes Historical Provider, MD  ferrous sulfate 325 (65 FE) MG tablet Take one po BID Patient taking differently: Take 325 mg by mouth. Take one po BID 04/06/14  Yes Jessica D. Zehr, PA-C  levothyroxine (SYNTHROID, LEVOTHROID) 100 MCG tablet Take 100 mcg by mouth daily before breakfast.   Yes Historical Provider, MD  Multiple Vitamin (MULTIVITAMIN) capsule Take 1 capsule by mouth daily.   Yes Historical Provider, MD  pantoprazole (PROTONIX) 40 MG tablet Take 40 mg by mouth daily.    Yes Historical Provider, MD  Polyethyl Glycol-Propyl Glycol (SYSTANE) 0.4-0.3 % SOLN Apply 2 drops to eye at bedtime.   Yes Historical Provider, MD  pravastatin (PRAVACHOL) 10 MG tablet  Take 10 mg by mouth daily.  09/11/13  Yes Historical Provider, MD  zolpidem (AMBIEN CR) 12.5 MG CR tablet Take 12.5 mg by mouth every other day.   Yes Historical Provider, MD   Pulse 53  Resp 14  SpO2 97% Physical Exam  Constitutional: She is oriented to person, place, and time. She appears well-developed and well-nourished. No distress.  HENT:  Head: Normocephalic and atraumatic.  Eyes: Conjunctivae are normal. No scleral icterus.  Neck: Normal range of motion.  Cardiovascular: Normal rate, regular rhythm and normal heart sounds.  Exam reveals no gallop and no friction rub.   No murmur heard. Pulmonary/Chest: Effort normal and breath sounds normal. No respiratory distress.  Abdominal: Soft. Bowel sounds are normal. She exhibits no distension and no mass. There is no tenderness. There is no guarding.  Neurological: She is alert and oriented to person, place, and time.  Skin: Skin is warm and dry. She is not diaphoretic.  Nursing note and vitals reviewed.   ED Course  Procedures (including critical care time) Labs Review Labs Reviewed  CBC - Abnormal; Notable for the following:    Hemoglobin 11.8 (*)    RDW 19.2 (*)    All other components within normal limits  BASIC METABOLIC PANEL - Abnormal; Notable for the following:    Glucose, Bld 104 (*)    Creatinine, Ser 1.22 (*)    GFR calc non Af Amer 39 (*)    GFR calc Af Amer 45 (*)    All other components within normal limits  I-STAT TROPOININ, ED    Imaging Review No results found.   EKG Interpretation   Date/Time:  Tuesday September 08 2014 20:46:16 EST Ventricular Rate:  55 PR Interval:  188 QRS Duration: 92 QT Interval:  520 QTC Calculation: 497 R Axis:   18 Text Interpretation:  Sinus rhythm Borderline prolonged QT interval No  significant change since last tracing Confirmed by Christy Gentles  MD, [place]  [phone]) on 09/08/2014 8:54:52 PM      MDM   Final diagnoses:  Chest pain    11:01 PM BP 184/67 mmHg  Pulse  58  Resp 13  SpO2 94% Patient labs appear at baseline. Negative troponin. EKG shows no signs of acute ischemia or arrhythmia. Chest x-ray shows. Bronchial thickening, likely secondary to bronchitis. There is a T12 fracture noted by radiology. However, patient has no complaints of back pain.  Patient seen by the cardiology fellow.  Her does not feel the patient's pain is cardiac in etiology, and i am in agreement with his assessment.  She has had 2 negative troponins and no EKG changes suggestive of acute ischemia. Patient will be discharged.  She has follow up with her pcp this week She is comfortable with this plan. Discussed return precautions. The patient appears reasonably screened and/or stabilized for discharge and I doubt any other medical condition or other [facility] requiring further screening, evaluation, or treatment in the ED at this time prior to discharge.   Margarita Mail, PA-C 09/09/14 2007  Sharyon Cable, MD 09/11/14 [phone]

## 2014-09-09 DIAGNOSIS — R079 Chest pain, unspecified: Secondary | ICD-10-CM | POA: Diagnosis not present

## 2014-09-09 LAB — POC OCCULT BLOOD, ED: Fecal Occult Bld: NEGATIVE

## 2014-09-09 LAB — I-STAT TROPONIN, ED: Troponin i, poc: 0 ng/mL (ref 0.00–0.08)

## 2014-09-09 MED ORDER — AMLODIPINE BESYLATE 5 MG PO TABS: 5.0000 mg | ORAL_TABLET | Freq: Every day | ORAL | Status: DC

## 2014-09-09 MED ORDER — ISOSORBIDE MONONITRATE ER 30 MG PO TB24: 30.0000 mg | ORAL_TABLET | Freq: Every day | ORAL | Status: AC

## 2014-09-09 MED ORDER — HYDRALAZINE HCL 25 MG PO TABS
25.0000 mg | ORAL_TABLET | Freq: Once | ORAL | Status: AC
  Administered 2014-09-09: 25 mg via ORAL
  Filled 2014-09-09: qty 1

## 2014-09-09 MED ORDER — ISOSORBIDE MONONITRATE ER 30 MG PO TB24: 30.0000 mg | ORAL_TABLET | Freq: Every day | ORAL | Status: DC

## 2014-09-09 NOTE — Discharge Instructions (Signed)
Please take all your medications as directed! Follow up with your primary care doctor concerning your high blood pressure.  Abdominal (belly) pain can be caused by many things. Your caregiver performed an examination and possibly ordered blood/urine tests and imaging (CT scan, x-rays, ultrasound). Many cases can be observed and treated at home after initial evaluation in the emergency department. Even though you are being discharged home, abdominal pain can be unpredictable. Therefore, you need a repeated exam if your pain does not resolve, returns, or worsens. Most patients with abdominal pain don't have to be admitted to the hospital or have surgery, but serious problems like appendicitis and gallbladder attacks can start out as nonspecific pain. Many abdominal conditions cannot be diagnosed in one visit, so follow-up evaluations are very important. SEEK IMMEDIATE MEDICAL ATTENTION IF: The pain does not go away or becomes severe.  A temperature above 101 develops.  Repeated vomiting occurs (multiple episodes).  The pain becomes localized to portions of the abdomen. The right side could possibly be appendicitis. In an adult, the left lower portion of the abdomen could be colitis or diverticulitis.  Blood is being passed in stools or vomit (bright red or black tarry stools).  Return also if you develop chest pain, difficulty breathing, dizziness or fainting, or become confused, poorly responsive, or inconsolable (young children).  Hypertension Hypertension, commonly called high blood pressure, is when the force of blood pumping through your arteries is too strong. Your arteries are the blood vessels that carry blood from your heart throughout your body. A blood pressure reading consists of a higher number over a lower number, such as 110/72. The higher number (systolic) is the pressure inside your arteries when your heart pumps. The lower number (diastolic) is the pressure inside your arteries when your  heart relaxes. Ideally you want your blood pressure below 120/80. Hypertension forces your heart to work harder to pump blood. Your arteries may become narrow or stiff. Having hypertension puts you at risk for heart disease, stroke, and other problems.  RISK FACTORS Some risk factors for high blood pressure are controllable. Others are not.  Risk factors you cannot control include:   Race. You may be at higher risk if you are African American.  Age. Risk increases with age.  Gender. Men are at higher risk than women before age 56 years. After age 54, women are at higher risk than men. Risk factors you can control include:  Not getting enough exercise or physical activity.  Being overweight.  Getting too much fat, sugar, calories, or salt in your diet.  Drinking too much alcohol. SIGNS AND SYMPTOMS Hypertension does not usually cause signs or symptoms. Extremely high blood pressure (hypertensive crisis) may cause headache, anxiety, shortness of breath, and nosebleed. DIAGNOSIS  To check if you have hypertension, your health care provider will measure your blood pressure while you are seated, with your arm held at the level of your heart. It should be measured at least twice using the same arm. Certain conditions can cause a difference in blood pressure between your right and left arms. A blood pressure reading that is higher than normal on one occasion does not mean that you need treatment. If one blood pressure reading is high, ask your health care provider about having it checked again. TREATMENT  Treating high blood pressure includes making lifestyle changes and possibly taking medicine. Living a healthy lifestyle can help lower high blood pressure. You may need to change some of your habits. Lifestyle changes  may include:  Following the DASH diet. This diet is high in fruits, vegetables, and whole grains. It is low in salt, red meat, and added sugars.  Getting at least 2 hours of  brisk physical activity every week.  Losing weight if necessary.  Not smoking.  Limiting alcoholic beverages.  Learning ways to reduce stress. If lifestyle changes are not enough to get your blood pressure under control, your health care provider may prescribe medicine. You may need to take more than one. Work closely with your health care provider to understand the risks and benefits. HOME CARE INSTRUCTIONS  Have your blood pressure rechecked as directed by your health care provider.   Take medicines only as directed by your health care provider. Follow the directions carefully. Blood pressure medicines must be taken as prescribed. The medicine does not work as well when you skip doses. Skipping doses also puts you at risk for problems.   Do not smoke.   Monitor your blood pressure at home as directed by your health care provider. SEEK MEDICAL CARE IF:   You think you are having a reaction to medicines taken.  You have recurrent headaches or feel dizzy.  You have swelling in your ankles.  You have trouble with your vision. SEEK IMMEDIATE MEDICAL CARE IF:  You develop a severe headache or confusion.  You have unusual weakness, numbness, or feel faint.  You have severe chest or abdominal pain.  You vomit repeatedly.  You have trouble breathing. MAKE SURE YOU:   Understand these instructions.  Will watch your condition.  Will get help right away if you are not doing well or get worse. Document Released: 09/25/2005 Document Revised: 02/09/2014 Document Reviewed: 07/18/2013 [facility] Patient Information 2015 [place], [place]. This information is not intended to replace advice given to you by your health care provider. Make sure you discuss any questions you have with your health care provider.

## 2014-09-14 DIAGNOSIS — I10 Essential (primary) hypertension: Secondary | ICD-10-CM | POA: Diagnosis not present

## 2014-09-14 DIAGNOSIS — I2510 Atherosclerotic heart disease of native coronary artery without angina pectoris: Secondary | ICD-10-CM | POA: Diagnosis not present

## 2014-09-14 DIAGNOSIS — K219 Gastro-esophageal reflux disease without esophagitis: Secondary | ICD-10-CM | POA: Diagnosis not present

## 2014-09-14 DIAGNOSIS — E785 Hyperlipidemia, unspecified: Secondary | ICD-10-CM | POA: Diagnosis not present

## 2014-09-15 DIAGNOSIS — I10 Essential (primary) hypertension: Secondary | ICD-10-CM | POA: Diagnosis not present

## 2014-11-16 ENCOUNTER — Telehealth: Payer: Self-pay | Admitting: Cardiovascular Disease

## 2014-11-16 NOTE — Telephone Encounter (Signed)
Joey(grandson) is calling because Mrs.Riga is has been having rapid heart beat for a couple of weeks . Would like to speak to someone about this . Pease call   Thanks

## 2014-11-16 NOTE — Telephone Encounter (Signed)
Returned call to pt's grandson. For the past two weeks patient has felt her heart racing. She took a double of medication for palpitations on yesterday and does feel better today. Pt's grandson is wondering if a change in medication or treatment could occur to help eliminate this issue. Pt has no other complaints besides the "racing heartbeat". Will forward to Dr. Gwenlyn Found.

## 2014-11-19 NOTE — Telephone Encounter (Signed)
Make an appointment for patient is here mid-level provider

## 2014-11-20 ENCOUNTER — Telehealth: Payer: Self-pay | Admitting: Cardiovascular Disease

## 2014-11-20 MED ORDER — NITROGLYCERIN 0.4 MG SL SUBL: 0.4000 mg | SUBLINGUAL_TABLET | SUBLINGUAL | Status: DC | PRN

## 2014-11-20 NOTE — Telephone Encounter (Signed)
°  1. Which medications need to be refilled? NTG sub- not the nasal kind  2. Which pharmacy is medication to be sent to?[facility] on [address]  3. Do they need a 30 day or 90 day supply? 30  4. Would they like a call back once the medication has been sent to the pharmacy? no

## 2014-11-20 NOTE — Telephone Encounter (Signed)
Spoke with patient grandson to confirm patient has not had any chest pain. Grandson confirmed no chest pain, patient just feels safer with it .  Rx refill sent to patient pharmacy

## 2014-12-07 ENCOUNTER — Telehealth: Payer: Self-pay | Admitting: Cardiovascular Disease

## 2014-12-07 NOTE — Telephone Encounter (Signed)
OK -- will see her.  [place]

## 2014-12-07 NOTE — Telephone Encounter (Signed)
.  Pt c/o swelling: STAT is pt has developed SOB within 24 hours  1. How long have you been experiencing swelling? 2 days  2. Where is the swelling located? Feet and ankles  3.  Are you currently taking a "fluid pill"? No fluid pill was specified  4.  Are you currently SOB? Nothing that is usual   5.  Have you traveled recently?no

## 2014-12-07 NOTE — Telephone Encounter (Signed)
SOB is "normal" per grandson  Swelling in ankles and feet (started Th/Fr of last week; grandson saw pt Saturday afternoon) Occassionally has rapid heartbeat  Pt added to Dr. Allison Quarry (DOD on 3/1) schedule at 3:30pm on 3/1. Spoke to scheduler to have her added.

## 2014-12-08 ENCOUNTER — Ambulatory Visit (INDEPENDENT_AMBULATORY_CARE_PROVIDER_SITE_OTHER): Payer: Medicare Other | Admitting: Cardiology

## 2014-12-08 ENCOUNTER — Encounter: Payer: Self-pay | Admitting: Cardiology

## 2014-12-08 VITALS — BP 166/92 | HR 58 | Ht 66.0 in | Wt 165.8 lb

## 2014-12-08 DIAGNOSIS — Z9861 Coronary angioplasty status: Secondary | ICD-10-CM | POA: Diagnosis not present

## 2014-12-08 DIAGNOSIS — Z8719 Personal history of other diseases of the digestive system: Secondary | ICD-10-CM

## 2014-12-08 DIAGNOSIS — E785 Hyperlipidemia, unspecified: Secondary | ICD-10-CM

## 2014-12-08 DIAGNOSIS — R000 Tachycardia, unspecified: Secondary | ICD-10-CM | POA: Diagnosis not present

## 2014-12-08 DIAGNOSIS — R197 Diarrhea, unspecified: Secondary | ICD-10-CM

## 2014-12-08 DIAGNOSIS — R002 Palpitations: Secondary | ICD-10-CM | POA: Diagnosis not present

## 2014-12-08 DIAGNOSIS — I480 Paroxysmal atrial fibrillation: Secondary | ICD-10-CM

## 2014-12-08 DIAGNOSIS — I10 Essential (primary) hypertension: Secondary | ICD-10-CM

## 2014-12-08 DIAGNOSIS — I2510 Atherosclerotic heart disease of native coronary artery without angina pectoris: Secondary | ICD-10-CM | POA: Diagnosis not present

## 2014-12-08 DIAGNOSIS — R0609 Other forms of dyspnea: Secondary | ICD-10-CM | POA: Diagnosis not present

## 2014-12-08 NOTE — Patient Instructions (Signed)
[name] has recommended that you wear an event monitor. Event monitors are medical devices that record the heart's electrical activity. Doctors most often [place] these monitors to diagnose arrhythmias. Arrhythmias are problems with the speed or rhythm of the heartbeat. The monitor is a small, portable device. You can wear one while you do your normal daily activities. This is usually used to diagnose what is causing palpitations/syncope (passing out).  Your physician has requested that you have an echocardiogram. Echocardiography is a painless test that uses sound waves to create images of your heart. It provides your doctor with information about the size and shape of your heart and how well your heart's chambers and valves are working. This procedure takes approximately one hour. There are no restrictions for this procedure.   Your physician recommends that you schedule a follow-up appointment in [place] 2016 TO FOLLOW UP  RESULTS.

## 2014-12-09 ENCOUNTER — Telehealth (HOSPITAL_COMMUNITY): Payer: Self-pay | Admitting: *Deleted

## 2014-12-09 NOTE — Telephone Encounter (Signed)
I called the patient to schedule her echo and she wants to know if she can wear the monitor for only 1 week instead of 2. She says it will too difficult for her to wear it for 2 weeks. She also wants to wait on scheduling the echo..  Please call the patient and advise on what to do

## 2014-12-09 NOTE — Telephone Encounter (Signed)
Spoke to pt, encouraged monitor for 2 weeks would be most beneficial to determine what her heart activity is. She voiced understanding & willingness to comply.  Regarding echo, she suggested that her only real limitations were that Mondays or Fridays were preferred days. Her grandson is her primary point of contact locally and he is off work those days. However, he has a long weekend trip this weekend. She should be able to schedule for a bit further out. Further discussion, she would be willing to find other transportation if Mon or Fri are not available.   I informed pt that I would let Charlena Cross know and have her call pt back to schedule echo.

## 2014-12-10 ENCOUNTER — Encounter: Payer: Self-pay | Admitting: Cardiology

## 2014-12-10 DIAGNOSIS — I480 Paroxysmal atrial fibrillation: Secondary | ICD-10-CM | POA: Insufficient documentation

## 2014-12-10 DIAGNOSIS — Z9861 Coronary angioplasty status: Secondary | ICD-10-CM

## 2014-12-10 DIAGNOSIS — R0609 Other forms of dyspnea: Secondary | ICD-10-CM

## 2014-12-10 DIAGNOSIS — I2510 Atherosclerotic heart disease of native coronary artery without angina pectoris: Secondary | ICD-10-CM | POA: Insufficient documentation

## 2014-12-10 NOTE — Assessment & Plan Note (Signed)
Has been maintaining sinus rhythm with amiodarone. His also on additional rate control from atenolol. I don't see that she is anticoagulated. I would defer this decision to PCP. Follow-up results of cardiac monitor to determine if she is indeed having recurrence of PAF versus another arrhythmia.  With a low resting heart rate, I'm reluctant to increase any rhythm or rate control agents. I would like to see what is we are treating.

## 2014-12-10 NOTE — Assessment & Plan Note (Addendum)
Blood pressure is not well-controlled. In the past and had been well controlled. She may be a little bit volume overloaded. It also may be related to her feeling anxious. I don't see that she is on an ARB or ACE inhibitor which could be considered. Otherwise continue amlodipine and atenolol for blood pressure control. We can reassess her pressure in return follow-up after monitor. I do hear an S4 which would suggest probable diastolic dysfunction. That could be explains her exertional dyspnea. We'll need to monitor once she feels better from her rapid heartbeat episodes.

## 2014-12-10 NOTE — Progress Notes (Signed)
PCP: Thressa Sheller, MD  Clinic Note: Chief Complaint  Patient presents with  . Follow-up    pt c/o swelling and numbness in legs and feet, rapid heart beat   HPI: Jeanne Hart is a 81 y.o. female with a PMH below who presents today for  Work-in evaluation for swelling and numbness in her feet as well as rapid heartbeat.  she is a patient who is usually followed by Dr. Quay Burow , last seen in November 2015. She has a history of PAF as well as CAD status post PCI (4 total stents) along with other risk factors  Such as hypertension and dyslipidemia. She has been maintained on amiodarone for her atrial fibrillation as far as Dr. Kennon Holter notes indicate she has not had any recurrent flares in a while. She is on atenolol for additional heart rate control.  Past Medical History  Diagnosis Date  . Insomnia     chronic  . IBS (irritable bowel syndrome)   . HTN (hypertension)   . Dyslipidemia   . Family history of cardiovascular disease   . PAF (paroxysmal atrial fibrillation)     coumadin was stopped secondary to GI Bleed, on amiodarone; monitor march 2014-[facility]  . CAD S/P percutaneous coronary angioplasty     PCI x 2; once at [facility] and once in [place]  . Hypothyroidism   . Chest pain   . Dyspnea on exertion   . GERD (gastroesophageal reflux disease)   . Diverticulitis    Prior Cardiac Evaluation and Past Surgical History: Past Surgical History  Procedure Laterality Date  . Coronary angioplasty with stent placement  01/05/2011    2.5x32mm Promus DES stent to RCA post dilated to 2.75x55mm   . Coronary angioplasty with stent placement  ?    3 stents per patient  . Appendectomy    . Cholecystectomy    . Abdominal hysterectomy     Myoview 07/29/2013: normal stress test. No ischemia or infarction. Normal wall motion. EF 66%  Transthoracic Echo 07/31/2013: EF 55-60%.no RWMA, moderate MAC, moderate LA dilation. Mild MR. Moderate TR with mild PA pressure elevation at 37  mmHg.  Interval History: she presents today with about a week long history of off and on rapid heartbeats that lasted several minutes to half an hour in time. Occasionally associated with chest tightness. She does note exertional dyspnea. She says that these palpitation episodes come and go but occasionally the last most of the night. She is also noted left greater than right lower extremity edema, but nothing significant.she denies any resting or exertional chest tightness or pressure except when rapid heart rates are occurring. It doesn't make her feel dizzy or lightheaded, with no near syncope or syncope, TIA/amaurosis fugax. She just feels weak, tired with a discomfort sensation goes up to her jawline. She has some insomnia and restless leg type symptoms that usually are better she is able take her Ambien. She denies heart failure symptoms of PND, orthopnea but has had some edema along with exertional dyspnea. She denies any bleeding issues. Does not appear that she is on anticoagulation for her atrial fibrillation, possibly because she has been well controlled from a rhythm standpoint with amiodarone.  ROS: A comprehensive was performed. Review of Systems  Constitutional: Positive for malaise/fatigue (Only when her heart is going fast).  HENT: Negative for nosebleeds.   Eyes: Positive for blurred vision (At baseline).  Respiratory: Positive for shortness of breath. Negative for cough and wheezing.   Cardiovascular: Positive  for palpitations and leg swelling.  Neurological: Positive for dizziness (Occasionally dizzy from both positional standpoint and with tachycardia palpitations). Negative for sensory change, focal weakness and headaches.  Endo/Heme/Allergies: Does not bruise/bleed easily.  Psychiatric/Behavioral: The patient is nervous/anxious (Since the rapid heartbeat episodes began).   All other systems reviewed and are negative.   Current Outpatient Prescriptions on File Prior to Visit   Medication Sig Dispense Refill  . amiodarone (PACERONE) 200 MG tablet TAKE 1 TABLET BY MOUTH EVERY DAY 30 tablet 6  . amLODipine (NORVASC) 5 MG tablet Take 1 tablet (5 mg total) by mouth daily. 30 tablet 0  . Ascorbic Acid (VITAMIN C PO) Take 1 tablet by mouth daily.     Marland Kitchen aspirin 325 MG EC tablet Take 325 mg by mouth daily.    Marland Kitchen atenolol (TENORMIN) 25 MG tablet Take 25 mg by mouth daily.     . Cholecalciferol (VITAMIN D PO) Take 1 tablet by mouth daily.     . ferrous sulfate 325 (65 FE) MG tablet Take one po BID (Patient taking differently: Take 325 mg by mouth. Take one po BID) 60 tablet 3  . isosorbide mononitrate (IMDUR) 30 MG 24 hr tablet Take 1 tablet (30 mg total) by mouth daily. 30 tablet 0  . levothyroxine (SYNTHROID, LEVOTHROID) 100 MCG tablet Take 100 mcg by mouth daily before breakfast.    . Multiple Vitamin (MULTIVITAMIN) capsule Take 1 capsule by mouth daily.    . nitroGLYCERIN (NITROSTAT) 0.4 MG SL tablet Place 1 tablet (0.4 mg total) under the tongue every 5 (five) minutes as needed for chest pain. 25 tablet 1  . pantoprazole (PROTONIX) 40 MG tablet Take 40 mg by mouth daily.     Vladimir Faster Glycol-Propyl Glycol (SYSTANE) 0.4-0.3 % SOLN Apply 2 drops to eye at bedtime.    . pravastatin (PRAVACHOL) 10 MG tablet Take 10 mg by mouth daily.     Marland Kitchen zolpidem (AMBIEN CR) 12.5 MG CR tablet Take 12.5 mg by mouth every other day.     No current facility-administered medications on file prior to visit.   Allergies  Allergen Reactions  . Nexium [Esomeprazole] Other (See Comments)    Aggravates IBS.   . Statins Other (See Comments)    Had jaundice as child.  Does tolerate current statin therapy.    . Codeine Other (See Comments)    Post-surgical reaction of unknown type.  . Morphine And Related Other (See Comments)    Unknown.    Marland Kitchen Penicillins Other (See Comments)    Unknown.  . Sulfa Antibiotics Other (See Comments)    Unknown childhood reaction.      SOCIAL AND FAMILY HISTORY  REVIEWED IN EPIC -- NO change  Wt Readings from Last 3 Encounters:  12/08/14 165 lb 12.8 oz (75.206 kg)  09/02/14 161 lb (73.029 kg)  04/03/14 160 lb 12.8 oz (72.938 kg)    PHYSICAL EXAM BP 166/92 mmHg  Pulse 58  Ht 5\' 6"  (1.676 m)  Wt 165 lb 12.8 oz (75.206 kg)  BMI 26.77 kg/m2 General appearance: alert, cooperative, appears stated age, no distress - but seems concerned  and otherwise healthy. Neck: no adenopathy, no carotid bruit and no JVD Lungs: Mostly CTAB, normal percussion bilaterally and non-labored Heart: regular rate and rhythm, S1&S2 normal, no murmur, + S4 gallop, no rubs. Nondisplaced PMI. Abdomen: soft, non-tender; bowel sounds normal; no masses,  no organomegaly; no HJR Extremities: extremities normal, atraumatic, no cyanosis, and trace to 1+  edema  Pulses: 2+ and symmetric; Skin: normal and mobility and turgor normal  Neurologic: Mental status: Alert, oriented, thought content appropriate Cranial nerves: normal (II-XII grossly intact)   Adult ECG Report  Rate: 58 ;  Rhythm: sinus bradycardia and Computer read as long QT. It does not appear to be prolonged with QTc. QTc is 495.  Narrative Interpretation: Relatively normal EKG  Recent Labs:      Chemistry      Component Value Date/Time   NA 139 09/08/2014 2045   K 4.6 09/08/2014 2045   CL 104 09/08/2014 2045   CO2 20 09/08/2014 2045   BUN 21 09/08/2014 2045   CREATININE 1.22* 09/08/2014 2045      Component Value Date/Time   CALCIUM 9.3 09/08/2014 2045      ASSESSMENT / PLAN:  Evaluation will be with monitor and echocardiogram. How she does after these initial evaluations from a symptom standpoint. We may need to consider more aggressive and possibly ischemic evaluation.   Palpitations She has been noticing increasing rapid heartbeat episodes. She does have a history of PAF, however her symptoms which suggests very short-lived episodes.  Plan: 2 week event monitor in order to distinguish true atrial  fibrillation from other tachycardia.   Paroxysmal atrial fibrillation Has been maintaining sinus rhythm with amiodarone. His also on additional rate control from atenolol. I don't see that she is anticoagulated. I would defer this decision to PCP. Follow-up results of cardiac monitor to determine if she is indeed having recurrence of PAF versus another arrhythmia.  With a low resting heart rate, I'm reluctant to increase any rhythm or rate control agents. I would like to see what is we are treating.   Essential hypertension Blood pressure is not well-controlled. In the past and had been well controlled. She may be a little bit volume overloaded. It also may be related to her feeling anxious. I don't see that she is on an ARB or ACE inhibitor which could be considered. Otherwise continue amlodipine and atenolol for blood pressure control. We can reassess her pressure in return follow-up after monitor. I do hear an S4 which would suggest probable diastolic dysfunction. That could be explains her exertional dyspnea. We'll need to monitor once she feels better from her rapid heartbeat episodes.   DOE (dyspnea on exertion) She doesn't really describe symptoms of angina, however it could be. I'm more concerned with her possibly being some heart failure component. We will check 2-D echocardiogram to reassess her EF and diastolic indices.   CAD S/P percutaneous coronary angioplasty She doesn't describe any symptoms to suggest angina, however she is noting exertional dyspnea. Depending on how she feels in follow-up and the echocardiogram results, I am not inclined right now to pursue invasive evaluation, however we may consider stress testing after her initial evaluation.      Orders Placed This Encounter  Procedures  . EKG 12-Lead  . Cardiac event monitor    Palp, rapid heartbeat, PAF    Standing Status: Future     Number of Occurrences: 1     Standing Expiration Date: 12/08/2015     Scheduling Instructions:     2 WEEK EVENT MONITOR    Order Specific Question:  Where should this test be performed    Answer:  [facility]  . 2D Echocardiogram without contrast    Standing Status: Future     Number of Occurrences:      Standing Expiration Date: 12/08/2015    Order Specific Question:  Type  of Echo    Answer:  Complete    Order Specific Question:  Where should this test be performed    Answer:  [facility]    Order Specific Question:  Reason for exam-Echo    Answer:  CAD Native Vessel  414.01 / I25.10    Order Specific Question:  Reason for exam-Echo    Answer:  Dyspnea  786.09 / R06.00   No orders of the defined types were placed in this encounter.     Followup: With Dr. Gwenlyn Found in April - unless initial findings are grossly of normal.   HARDING, Leonie Green, M.D., M.S. Interventional Cardiologist   Pager # [phone]

## 2014-12-10 NOTE — Assessment & Plan Note (Signed)
She doesn't really describe symptoms of angina, however it could be. I'm more concerned with her possibly being some heart failure component. We will check 2-D echocardiogram to reassess her EF and diastolic indices.

## 2014-12-10 NOTE — Assessment & Plan Note (Signed)
She doesn't describe any symptoms to suggest angina, however she is noting exertional dyspnea. Depending on how she feels in follow-up and the echocardiogram results, I am not inclined right now to pursue invasive evaluation, however we may consider stress testing after her initial evaluation.

## 2014-12-10 NOTE — Assessment & Plan Note (Signed)
She has been noticing increasing rapid heartbeat episodes. She does have a history of PAF, however her symptoms which suggests very short-lived episodes.  Plan: 2 week event monitor in order to distinguish true atrial fibrillation from other tachycardia.

## 2014-12-17 DIAGNOSIS — L409 Psoriasis, unspecified: Secondary | ICD-10-CM | POA: Diagnosis not present

## 2014-12-22 ENCOUNTER — Inpatient Hospital Stay (HOSPITAL_COMMUNITY): Admission: RE | Admit: 2014-12-22 | Payer: Medicare Other | Source: Ambulatory Visit

## 2014-12-25 ENCOUNTER — Ambulatory Visit (HOSPITAL_COMMUNITY)
Admission: RE | Admit: 2014-12-25 | Discharge: 2014-12-25 | Disposition: A | Discharge status: Home / Self Care | Payer: Medicare Other | Source: Ambulatory Visit | Attending: Cardiovascular Disease | Admitting: Cardiovascular Disease

## 2014-12-25 DIAGNOSIS — R000 Tachycardia, unspecified: Secondary | ICD-10-CM | POA: Insufficient documentation

## 2014-12-25 DIAGNOSIS — R002 Palpitations: Secondary | ICD-10-CM | POA: Diagnosis not present

## 2014-12-25 DIAGNOSIS — I2510 Atherosclerotic heart disease of native coronary artery without angina pectoris: Secondary | ICD-10-CM | POA: Diagnosis not present

## 2014-12-25 DIAGNOSIS — I480 Paroxysmal atrial fibrillation: Secondary | ICD-10-CM | POA: Diagnosis not present

## 2014-12-25 DIAGNOSIS — R0609 Other forms of dyspnea: Secondary | ICD-10-CM | POA: Insufficient documentation

## 2014-12-25 NOTE — Progress Notes (Signed)
2D Echocardiogram Complete.  12/25/2014   Jeanne Hart, [place]

## 2014-12-28 NOTE — Progress Notes (Signed)
Quick Note:  Echo results: Good news: Essentially normal echocardiogram and normal pump function and only mild/ trivial valve disease. No regional wall motion abnormalities = No signs to suggest heart attack.. EF: 55-60%. Moderate Left Atrial dilation - not unexpected with Afib - would suggest longer standing condition. RV is moderately dilated & PA pressures are moderately increased - suggests elevated pressures in lungs.  Leonie Man, MD    ______

## 2014-12-29 ENCOUNTER — Telehealth: Payer: Self-pay | Admitting: *Deleted

## 2014-12-29 NOTE — Telephone Encounter (Signed)
-----   Message from Leonie Man, MD sent at 12/28/2014  5:46 PM EDT ----- Echo results: Good news: Essentially normal echocardiogram and normal pump function and only mild/ trivial valve disease.  No regional wall motion abnormalities = No signs to suggest heart attack.. EF: 55-60%. Moderate Left Atrial dilation - not unexpected with Afib - would suggest longer standing condition. RV is moderately dilated & PA pressures are moderately increased - suggests elevated pressures in lungs.  Leonie Man, MD

## 2014-12-29 NOTE — Telephone Encounter (Signed)
Spoke to patient. Result given . Verbalized understanding  

## 2015-01-08 DIAGNOSIS — R002 Palpitations: Secondary | ICD-10-CM | POA: Diagnosis not present

## 2015-01-28 DIAGNOSIS — L309 Dermatitis, unspecified: Secondary | ICD-10-CM | POA: Diagnosis not present

## 2015-01-28 DIAGNOSIS — L570 Actinic keratosis: Secondary | ICD-10-CM | POA: Diagnosis not present

## 2015-02-15 ENCOUNTER — Ambulatory Visit (INDEPENDENT_AMBULATORY_CARE_PROVIDER_SITE_OTHER): Payer: Medicare Other | Admitting: Cardiology

## 2015-02-15 ENCOUNTER — Encounter: Payer: Self-pay | Admitting: Cardiology

## 2015-02-15 VITALS — BP 128/74 | HR 60 | Ht 66.5 in | Wt 162.3 lb

## 2015-02-15 DIAGNOSIS — Z9861 Coronary angioplasty status: Secondary | ICD-10-CM | POA: Diagnosis not present

## 2015-02-15 DIAGNOSIS — M7989 Other specified soft tissue disorders: Secondary | ICD-10-CM

## 2015-02-15 DIAGNOSIS — I480 Paroxysmal atrial fibrillation: Secondary | ICD-10-CM

## 2015-02-15 DIAGNOSIS — R0609 Other forms of dyspnea: Secondary | ICD-10-CM

## 2015-02-15 DIAGNOSIS — I2510 Atherosclerotic heart disease of native coronary artery without angina pectoris: Secondary | ICD-10-CM

## 2015-02-15 DIAGNOSIS — R002 Palpitations: Secondary | ICD-10-CM | POA: Diagnosis not present

## 2015-02-15 DIAGNOSIS — I10 Essential (primary) hypertension: Secondary | ICD-10-CM

## 2015-02-15 MED ORDER — ASPIRIN EC 81 MG PO TBEC: 81.0000 mg | DELAYED_RELEASE_TABLET | Freq: Every day | ORAL | Status: AC

## 2015-02-15 NOTE — Progress Notes (Signed)
PCP: Thressa Sheller, MD  Clinic Note: Chief Complaint  Patient presents with  . ROV 2 months    patient reports swelling-left foot worse, rapid heart beat-takes extra heart tablet when it gets real bad, shortness of breath with minimal exertion.   HPI: Jeanne Hart is a 81 y.o. female with a PMH below who presents today for  Work-in evaluation for swelling and numbness in her feet as well as rapid heartbeat.  She is a patient who is usually followed by Dr. Quay Burow , last seen in November 2015. She has a history of PAF as well as CAD status post PCI (~3 before 12/2010 @ [facility], 4th in [place], [place])  along with other risk factors  Such as hypertension and dyslipidemia. She has been maintained on amiodarone for her atrial fibrillation as far as Dr. Kennon Holter notes indicate she has not had any recurrent flares in a while. She is on atenolol for additional heart rate control.she also takes an additional dose of her atenolol if she has a sensation of more rapid heartbeat than usual. Apparently after her last visit with me, she has Requested to follow up with me now.  I saw her in the beginning of March for her swelling in her legs and rapid heartbeat spells.  I had her wear time and monitor to see if she is having a recurrence of PAF. There was no evidence of arrhythmias on the monitor is indicated below. Just some PACs.  She had an echocardiogram to evaluate edema and exertional dyspnea. There was concern for possible diastolic dysfunction with an S4 gallop on exam.unfortunately, there was no comment made about diastolic function on the echocardiogram report.previously she was read as having grade 2 diastolic dysfunction which makes sense.  Past Medical History  Diagnosis Date  . Insomnia     chronic  . IBS (irritable bowel syndrome)   . HTN (hypertension)   . Dyslipidemia   . Family history of cardiovascular disease   . PAF (paroxysmal atrial fibrillation)     coumadin was stopped  secondary to GI Bleed, on amiodarone; monitor march 2014-[facility]  . CAD S/P percutaneous coronary angioplasty     PCI x 2; once at [facility] and once in [place]  . Hypothyroidism   . Chest pain   . Dyspnea on exertion   . GERD (gastroesophageal reflux disease)   . Diverticulitis    Prior Cardiac Evaluation and Past Surgical History: Past Surgical History  Procedure Laterality Date  . Coronary angioplasty with stent placement  01/05/2011    2.5x26mm Promus DES stent to RCA post dilated to 2.75x13mm   . Coronary angioplasty with stent placement  ?    3 stents per patient  . Appendectomy    . Cholecystectomy    . Abdominal hysterectomy     Myoview 07/29/2013: normal stress test. No ischemia or infarction. Normal wall motion. EF 66%  Transthoracic Echo 07/31/2013: EF 55-60%.no RWMA, moderate MAC, moderate LA dilation. Mild MR. Moderate TR with mild PA pressure elevation at 37 mmHg.  Echocardiogram 12/25/2014: EF 55 and 60%, no RW MA. Mod LA dilation, Mild MR, Mod TR - Moderately increased PAP (dilated IVC)  Event Monitor 12-2014: no arrhythmias. Just PACs. Mostly sinus rhythm, with sinus bradycardia first-degree AV block.  Interval History:  Today she feels better. She mostly complains of swelling in her feet is worse than the left and right. It does go down when she puts her feet up. She really doesn't have  any aching in her legs just discomfort in an a burning sensation in her feet. She is very happy to get results of her monitor. She still says she feels rapid heart beats but I can't tell she is asked to take an additional dose of atenolol. I did not assess her her that she's having any episodes of chest tightness or pressure. She says she walks a lot both inside and outside as much as her knees can tolerate. She says she gets short of breath she is carrying groceries or walks up an incline.  Overall it seems like her at heartbeat sensations have gotten better. She is not concern is  much better now. The exertional dyspnea stable. She is not having any chest tightness or pressure with rest or exertion.  Cardiovascular ROS: positive for - dyspnea on exertion, edema, palpitations and rapid heart rate negative for - chest pain, loss of consciousness, orthopnea, paroxysmal nocturnal dyspnea, shortness of breath or TIA/amaurosis fugax, syncope or near syncope.   ROS: A comprehensive was performed.  Review of Systems  Constitutional: Positive for malaise/fatigue (Not as noticeable as her heart rate is not as fast).  Eyes: Positive for blurred vision (At baseline).  Cardiovascular: Positive for leg swelling (bilateral (left> Right) feet). Negative for claudication.       Per history of present illness  Musculoskeletal: Positive for joint pain. Negative for falls.  Neurological: Positive for dizziness (Orthostatic and his heart rate goes fast).  Psychiatric/Behavioral: The patient is nervous/anxious (Less nervous).   All other systems reviewed and are negative.   Current Outpatient Prescriptions on File Prior to Visit  Medication Sig Dispense Refill  . amiodarone (PACERONE) 200 MG tablet TAKE 1 TABLET BY MOUTH EVERY DAY 30 tablet 6  . amLODipine (NORVASC) 5 MG tablet Take 1 tablet (5 mg total) by mouth daily. 30 tablet 0  . Ascorbic Acid (VITAMIN C PO) Take 1 tablet by mouth daily.     Marland Kitchen atenolol (TENORMIN) 25 MG tablet Take 25 mg by mouth daily.     . Cholecalciferol (VITAMIN D PO) Take 1 tablet by mouth daily.     . ferrous sulfate 325 (65 FE) MG tablet Take one po BID (Patient taking differently: Take 325 mg by mouth. Take one po BID) 60 tablet 3  . isosorbide mononitrate (IMDUR) 30 MG 24 hr tablet Take 1 tablet (30 mg total) by mouth daily. 30 tablet 0  . levothyroxine (SYNTHROID, LEVOTHROID) 100 MCG tablet Take 100 mcg by mouth daily before breakfast.    . Multiple Vitamin (MULTIVITAMIN) capsule Take 1 capsule by mouth daily.    . nitroGLYCERIN (NITROSTAT) 0.4 MG SL  tablet Place 1 tablet (0.4 mg total) under the tongue every 5 (five) minutes as needed for chest pain. 25 tablet 1  . pantoprazole (PROTONIX) 40 MG tablet Take 40 mg by mouth daily.     Vladimir Faster Glycol-Propyl Glycol (SYSTANE) 0.4-0.3 % SOLN Apply 2 drops to eye at bedtime.    . pravastatin (PRAVACHOL) 10 MG tablet Take 10 mg by mouth daily.     Marland Kitchen zolpidem (AMBIEN CR) 12.5 MG CR tablet Take 12.5 mg by mouth every other day.     No current facility-administered medications on file prior to visit.   Allergies  Allergen Reactions  . Nexium [Esomeprazole] Other (See Comments)    Aggravates IBS.   . Statins Other (See Comments)    Had jaundice as child.  Does tolerate current statin therapy.    Marland Kitchen  Codeine Other (See Comments)    Post-surgical reaction of unknown type.  . Morphine And Related Other (See Comments)    Unknown.    Marland Kitchen Penicillins Other (See Comments)    Unknown.  . Sulfa Antibiotics Other (See Comments)    Unknown childhood reaction.     History  Substance Use Topics  . Smoking status: Never Smoker   . Smokeless tobacco: Never Used  . Alcohol Use: No   Family History  Problem Relation Age of Onset  . Diabetes Maternal Aunt   . Diabetes Son   . Heart disease      Both sides of family  . Colon cancer Neg Hx     Wt Readings from Last 3 Encounters:  02/15/15 73.619 kg (162 lb 4.8 oz)  12/08/14 75.206 kg (165 lb 12.8 oz)  09/02/14 73.029 kg (161 lb)    PHYSICAL EXAM BP 128/74 mmHg  Pulse 60  Ht 5' 6.5" (1.689 m)  Wt 73.619 kg (162 lb 4.8 oz)  BMI 25.81 kg/m2 General appearance: alert, cooperative, appears stated age, no distress - but seems concerned  and otherwise healthy. Neck: no adenopathy, no carotid bruit and no JVD Lungs: Mostly CTAB, normal percussion bilaterally and non-labored Heart: regular rate and rhythm, S1&S2 normal, no murmur, + S4 gallop, no rubs. Nondisplaced PMI. Abdomen: soft, non-tender; bowel sounds normal; no masses,  no organomegaly; no  HJR Extremities: extremities normal, atraumatic, no cyanosis, and trace to 1+  edema  Pulses: 2+ and symmetric; Skin: normal and mobility and turgor normal  Neurologic: Mental status: Alert, oriented, thought content appropriate Cranial nerves: normal (II-XII grossly intact)   Adult ECG Report not checked   Recent Labs:    PCP checks lipids  ASSESSMENT / PLAN: Problem List Items Addressed This Visit    CAD S/P percutaneous coronary angioplasty (Chronic)    No symptoms to suggest angina. His exertional dyspnea. Echo looked pretty good overall LV wall motion. Continue to monitor for symptoms of possible angina equivalent. Low threshold to consider stress test. She is not all that excited about the concept of detailed evaluation. She is on aspirin and beta blocker as well as Imdur. She takes amlodipine for blood pressure but also antianginal effect as well. She is now taking pravastatin would best be monitored by PCP.  Reduce aspirin from 325- to 81 mg      Relevant Medications   aspirin EC 81 MG tablet   DOE (dyspnea on exertion)    Not necessarily anginal in nature. She could still have diastolic dysfunction needing any exertional dyspnea especially she is under strain with carrying something. She is not on a diuretic. Which would be something to consider.especially if her swelling gets worse.      Essential hypertension (Chronic)    Well-controlled today. She is not on ACE inhibitor or ARB. As indicated in my last note. If the swelling becomes an issue we may want to convert from amlodipine to an ACE inhibitor or ARB. We also may want to potentially consider the addition of some type of diuretic abuse as a when necessary. Some of the swelling may be related to amlodipine and Imdur.      Relevant Medications   aspirin EC 81 MG tablet   Foot swelling    This also seems to have improved. I think she may do well with support hose. Because her age to put her on a diuretic unless it  becomes bad. But is not having PND and orthopnea  symptoms I don't think it's heart failure mediated. For future followup may want to consider a when necessary diuretic. Otherwise I recommend that she keep her feetelevated when possible.      Palpitations (Chronic)    No true arrhythmia on the monitor. It does not mean that she is not having them. But when she was noticing that, there was no arrhythmia noted. Hopefully these are stable and appear to be benign. Continue beta blocker and amiodarone      Paroxysmal atrial fibrillation - Primary (Chronic)    Maintaining sinus rhythm and sinus bradycardia per or event monitor on amiodarone. Continue current plan of using when necessary atenolol for additional heart rate control if necessary. Otherwise she'll stay on once daily atenolol.  : Without any symptoms she is feeling well. She may very well and having some A. Fib. One thing to consider would be possibly bumping her amiodarone dose if she has more episodes. She has not been on anticoagulation. This was a decision made prior to my taking over her care. Deferred to PCP.      Relevant Medications   aspirin EC 81 MG tablet       No orders of the defined types were placed in this encounter.   Meds ordered this encounter  Medications  . FLUOCINOLONE ACETONIDE SCALP 0.01 % OIL    Sig: Apply 1 application topically 2 (two) times a week.    Refill:  2  . aspirin EC 81 MG tablet    Sig: Take 1 tablet (81 mg total) by mouth daily.    Dispense:  90 tablet    Refill:  3    Followup: 6 months   HARDING, Leonie Green, M.D., M.S. Interventional Cardiologist   Pager # [phone]

## 2015-02-15 NOTE — Patient Instructions (Signed)
DECREASE ASPIRIN TO 81 MG ONE TABLET DAILY.  NO CHANGE TO OTHER MEDICATIONS   Your physician wants you to follow-up in [place].  You will receive a reminder letter in the mail two months in advance. If you don't receive a letter, please call our office to schedule the follow-up appointment.

## 2015-02-18 DIAGNOSIS — M7989 Other specified soft tissue disorders: Secondary | ICD-10-CM | POA: Insufficient documentation

## 2015-02-18 NOTE — Assessment & Plan Note (Addendum)
No symptoms to suggest angina. His exertional dyspnea. Echo looked pretty good overall LV wall motion. Continue to monitor for symptoms of possible angina equivalent. Low threshold to consider stress test. She is not all that excited about the concept of detailed evaluation. She is on aspirin and beta blocker as well as Imdur. She takes amlodipine for blood pressure but also antianginal effect as well. She is now taking pravastatin would best be monitored by PCP.  Reduce aspirin from 325- to 81 mg

## 2015-02-18 NOTE — Assessment & Plan Note (Signed)
Not necessarily anginal in nature. She could still have diastolic dysfunction needing any exertional dyspnea especially she is under strain with carrying something. She is not on a diuretic. Which would be something to consider.especially if her swelling gets worse.

## 2015-02-18 NOTE — Assessment & Plan Note (Addendum)
No true arrhythmia on the monitor. It does not mean that she is not having them. But when she was noticing that, there was no arrhythmia noted. Hopefully these are stable and appear to be benign. Continue beta blocker and amiodarone

## 2015-02-18 NOTE — Assessment & Plan Note (Signed)
Maintaining sinus rhythm and sinus bradycardia per or event monitor on amiodarone. Continue current plan of using when necessary atenolol for additional heart rate control if necessary. Otherwise she'll stay on once daily atenolol.  : Without any symptoms she is feeling well. She may very well and having some A. Fib. One thing to consider would be possibly bumping her amiodarone dose if she has more episodes. She has not been on anticoagulation. This was a decision made prior to my taking over her care. Deferred to PCP.

## 2015-02-18 NOTE — Assessment & Plan Note (Signed)
Well-controlled today. She is not on ACE inhibitor or ARB. As indicated in my last note. If the swelling becomes an issue we may want to convert from amlodipine to an ACE inhibitor or ARB. We also may want to potentially consider the addition of some type of diuretic abuse as a when necessary. Some of the swelling may be related to amlodipine and Imdur.

## 2015-02-18 NOTE — Assessment & Plan Note (Signed)
This also seems to have improved. I think she may do well with support hose. Because her age to put her on a diuretic unless it becomes bad. But is not having PND and orthopnea symptoms I don't think it's heart failure mediated. For future followup may want to consider a when necessary diuretic. Otherwise I recommend that she keep her feetelevated when possible.

## 2015-02-25 ENCOUNTER — Telehealth: Payer: Self-pay | Admitting: Cardiology

## 2015-02-25 MED ORDER — AMIODARONE HCL 200 MG PO TABS
ORAL_TABLET | ORAL | Status: DC
Start: 2015-02-25 — End: 2015-05-11

## 2015-02-25 NOTE — Telephone Encounter (Signed)
Spoke with joey, patient grandson, the patient was told by dr berry that is she had an increase in her heart rate she could take an extra amiodarone to slow it down. She is having to take an extra tablet maybe 2-3 times every month. Her prescription is running out early. New script sent to the pharmacy with prn directions and larger quantity.

## 2015-02-25 NOTE — Telephone Encounter (Signed)
Please call,need to find out about her medicine.

## 2015-03-29 ENCOUNTER — Telehealth: Payer: Self-pay | Admitting: Cardiology

## 2015-03-29 MED ORDER — FUROSEMIDE 20 MG PO TABS: 20.0000 mg | ORAL_TABLET | Freq: Every day | ORAL | Status: DC | PRN

## 2015-03-29 NOTE — Telephone Encounter (Signed)
Joey was calling back to speak with Ovid Curd

## 2015-03-29 NOTE — Telephone Encounter (Signed)
Try low dose Lasix 20 mg daily x 3 days - then PRN edema.  [place]

## 2015-03-29 NOTE — Telephone Encounter (Signed)
Joey called for update.  Advised caller I am still waiting to hear from Dr. Ellyn Hack.  Confirmed preferred pharmacy if new meds advised.  Explained to caller I will update later today if i get recommendation, if not, will speak to Dr. Ellyn Hack tomorrow.   Caller voiced understanding, OK w plan.

## 2015-03-29 NOTE — Telephone Encounter (Signed)
Spoke to grandson - DPR on file.  We discussed pt's last visit on 5/20. She is still having some of the same issues w/ LE swelling. Denies worse/new problems, no dyspnea, worsened swelling, etc.  I asked if she is wearing support hose, he states "I think so, can't say she does 100% of the time" - thinks she is for most part compliant.  He does not swelling reduces when she elevates her feet.  He does note her salt intake is probably higher than optimal - prepackaged meals - i reiterated importance of lowering sodium intake.   He wanted to schedule another appt; I recommended appt may not be necessary in absence of new problems, would defer to Dr. Ellyn Hack for recommendation --- PRN diuretic was suggested at last OV as possibility if problem remained.  He voiced agreement w/ plan - will defer to Dr. Ellyn Hack for advice.

## 2015-03-29 NOTE — Telephone Encounter (Signed)
Joey is calling because his grandmother ( Mrs. Whittingham) has been having some swelling in her feet . Please call    Thanks

## 2015-03-29 NOTE — Telephone Encounter (Signed)
Returned pt call, explained use of medication, intended effect, possible side effects, time, duration and dosing. Understanding verbalized.  Pt instructed to call ~2 weeks if issues unresolved/call sooner if new symptoms.

## 2015-04-13 ENCOUNTER — Telehealth: Payer: Self-pay | Admitting: Cardiology

## 2015-04-13 DIAGNOSIS — R600 Localized edema: Secondary | ICD-10-CM

## 2015-04-13 NOTE — Telephone Encounter (Signed)
Jeanne Hart (grandson) is calling to get a prescription sent over [facility] supply for some compression socks . Please call .Marland Kitchen  Thanks

## 2015-04-13 NOTE — Telephone Encounter (Signed)
Routed to Dr. Ellyn Hack to advise on compression stockings - knee high? and what strength? Patient had called in 6/20 with complaints of LE edema  Called Joey who reports improvements with bilateral LE edema but still swollen Compression socks came from [facility] - probably no good, per grandson.   Joey would like prescription for stockings He is aware patient has heart problems and reports her diet is bad (eats processed foods and a lot of sweets)  Joey reports that patient has some dexterity in her hands but may not claim this. Advised Joey the prescription compression stockings can be difficult to get on and off

## 2015-04-14 NOTE — Telephone Encounter (Signed)
Compression stockings ordered. Will be faxed to [place]: Jan @ [phone] per Joey's request

## 2015-04-14 NOTE — Telephone Encounter (Signed)
Use a knee-high, 20-30 strength Khalia Gong, Leonie Green, MD

## 2015-04-26 DIAGNOSIS — I130 Hypertensive heart and chronic kidney disease with heart failure and stage 1 through stage 4 chronic kidney disease, or unspecified chronic kidney disease: Secondary | ICD-10-CM | POA: Diagnosis not present

## 2015-04-26 DIAGNOSIS — Z13820 Encounter for screening for osteoporosis: Secondary | ICD-10-CM | POA: Diagnosis not present

## 2015-04-26 DIAGNOSIS — I10 Essential (primary) hypertension: Secondary | ICD-10-CM | POA: Diagnosis not present

## 2015-04-26 DIAGNOSIS — Z0000 Encounter for general adult medical examination without abnormal findings: Secondary | ICD-10-CM | POA: Diagnosis not present

## 2015-04-26 DIAGNOSIS — Z23 Encounter for immunization: Secondary | ICD-10-CM | POA: Diagnosis not present

## 2015-04-26 DIAGNOSIS — E785 Hyperlipidemia, unspecified: Secondary | ICD-10-CM | POA: Diagnosis not present

## 2015-04-26 DIAGNOSIS — Z1389 Encounter for screening for other disorder: Secondary | ICD-10-CM | POA: Diagnosis not present

## 2015-04-26 DIAGNOSIS — E039 Hypothyroidism, unspecified: Secondary | ICD-10-CM | POA: Diagnosis not present

## 2015-04-26 DIAGNOSIS — N184 Chronic kidney disease, stage 4 (severe): Secondary | ICD-10-CM | POA: Diagnosis not present

## 2015-05-10 ENCOUNTER — Observation Stay (HOSPITAL_COMMUNITY)
Admission: EM | Admit: 2015-05-10 | Discharge: 2015-05-11 | Disposition: A | Discharge status: Home / Self Care | Payer: Medicare Other | Attending: Internal Medicine | Admitting: Internal Medicine

## 2015-05-10 ENCOUNTER — Emergency Department (HOSPITAL_COMMUNITY): Payer: Medicare Other

## 2015-05-10 ENCOUNTER — Encounter (HOSPITAL_COMMUNITY): Payer: Self-pay

## 2015-05-10 DIAGNOSIS — Z7982 Long term (current) use of aspirin: Secondary | ICD-10-CM | POA: Diagnosis not present

## 2015-05-10 DIAGNOSIS — I10 Essential (primary) hypertension: Secondary | ICD-10-CM | POA: Diagnosis present

## 2015-05-10 DIAGNOSIS — G4700 Insomnia, unspecified: Secondary | ICD-10-CM | POA: Diagnosis not present

## 2015-05-10 DIAGNOSIS — I2510 Atherosclerotic heart disease of native coronary artery without angina pectoris: Secondary | ICD-10-CM | POA: Diagnosis not present

## 2015-05-10 DIAGNOSIS — R110 Nausea: Secondary | ICD-10-CM | POA: Diagnosis not present

## 2015-05-10 DIAGNOSIS — R079 Chest pain, unspecified: Secondary | ICD-10-CM | POA: Diagnosis not present

## 2015-05-10 DIAGNOSIS — R0789 Other chest pain: Secondary | ICD-10-CM | POA: Diagnosis not present

## 2015-05-10 DIAGNOSIS — E785 Hyperlipidemia, unspecified: Secondary | ICD-10-CM | POA: Insufficient documentation

## 2015-05-10 DIAGNOSIS — K589 Irritable bowel syndrome without diarrhea: Secondary | ICD-10-CM | POA: Diagnosis not present

## 2015-05-10 DIAGNOSIS — K219 Gastro-esophageal reflux disease without esophagitis: Secondary | ICD-10-CM | POA: Insufficient documentation

## 2015-05-10 DIAGNOSIS — E039 Hypothyroidism, unspecified: Secondary | ICD-10-CM | POA: Diagnosis present

## 2015-05-10 DIAGNOSIS — I160 Hypertensive urgency: Secondary | ICD-10-CM | POA: Diagnosis present

## 2015-05-10 DIAGNOSIS — I480 Paroxysmal atrial fibrillation: Secondary | ICD-10-CM | POA: Diagnosis present

## 2015-05-10 DIAGNOSIS — Z880 Allergy status to penicillin: Secondary | ICD-10-CM | POA: Diagnosis not present

## 2015-05-10 DIAGNOSIS — K2970 Gastritis, unspecified, without bleeding: Secondary | ICD-10-CM | POA: Diagnosis not present

## 2015-05-10 DIAGNOSIS — K5792 Diverticulitis of intestine, part unspecified, without perforation or abscess without bleeding: Secondary | ICD-10-CM | POA: Diagnosis not present

## 2015-05-10 DIAGNOSIS — Z9861 Coronary angioplasty status: Secondary | ICD-10-CM | POA: Insufficient documentation

## 2015-05-10 DIAGNOSIS — Z79899 Other long term (current) drug therapy: Secondary | ICD-10-CM | POA: Insufficient documentation

## 2015-05-10 LAB — BASIC METABOLIC PANEL
Anion gap: 7 (ref 5–15)
BUN: 18 mg/dL (ref 6–20)
CO2: 29 mmol/L (ref 22–32)
Calcium: 9.4 mg/dL (ref 8.9–10.3)
Chloride: 103 mmol/L (ref 101–111)
Creatinine, Ser: 1.39 mg/dL — ABNORMAL HIGH (ref 0.44–1.00)
GFR calc Af Amer: 39 mL/min — ABNORMAL LOW (ref >60)
GFR calc non Af Amer: 34 mL/min — ABNORMAL LOW (ref >60)
Glucose, Bld: 101 mg/dL — ABNORMAL HIGH (ref 65–99)
Potassium: 3.8 mmol/L (ref 3.5–5.1)
Sodium: 139 mmol/L (ref 135–145)

## 2015-05-10 LAB — CBC
HCT: 39 % (ref 36.0–46.0)
Hemoglobin: 13.1 g/dL (ref 12.0–15.0)
MCH: 31.1 pg (ref 26.0–34.0)
MCHC: 33.6 g/dL (ref 30.0–36.0)
MCV: 92.6 fL (ref 78.0–100.0)
Platelets: 158 10*3/uL (ref 150–400)
RBC: 4.21 MIL/uL (ref 3.87–5.11)
RDW: 14.3 % (ref 11.5–15.5)
WBC: 4.3 10*3/uL (ref 4.0–10.5)

## 2015-05-10 LAB — TROPONIN I: Troponin I: <0.03 ng/mL (ref <0.031)

## 2015-05-10 LAB — URINE MICROSCOPIC-ADD ON

## 2015-05-10 LAB — URINALYSIS, ROUTINE W REFLEX MICROSCOPIC
BILIRUBIN URINE: NEGATIVE
Glucose, UA: NEGATIVE mg/dL
HGB URINE DIPSTICK: NEGATIVE
KETONES UR: NEGATIVE mg/dL
NITRITE: NEGATIVE
PH: 7.5 (ref 5.0–8.0)
PROTEIN: NEGATIVE mg/dL
SPECIFIC GRAVITY, URINE: 1.005 (ref 1.005–1.030)
Urobilinogen, UA: 0.2 mg/dL (ref 0.0–1.0)

## 2015-05-10 LAB — BRAIN NATRIURETIC PEPTIDE: B Natriuretic Peptide: 281.5 pg/mL — ABNORMAL HIGH (ref 0.0–100.0)

## 2015-05-10 LAB — I-STAT TROPONIN, ED: TROPONIN I, POC: 0.01 ng/mL (ref 0.00–0.08)

## 2015-05-10 MED ORDER — NITROGLYCERIN 2 % TD OINT
0.5000 [in_us] | TOPICAL_OINTMENT | Freq: Once | TRANSDERMAL | Status: AC
  Administered 2015-05-10: 0.5 [in_us] via TOPICAL
  Filled 2015-05-10: qty 1

## 2015-05-10 NOTE — ED Notes (Signed)
Patient transported to X-ray 

## 2015-05-10 NOTE — ED Notes (Signed)
[facility] r/t CP since 5am describes pressure unresolved with home ntg. Given 324 asa and ntg x1 per ems

## 2015-05-10 NOTE — ED Provider Notes (Signed)
CSN: [id number]     Arrival date & time 05/10/15  1656 History   First MD Initiated Contact with Patient 05/10/15 1709     Chief Complaint  Patient presents with  . Chest Pain     (Consider location/radiation/quality/duration/timing/severity/associated sxs/prior Treatment) HPI Comments: 81yo F w/ extensive cardiac hx including CAD s/p stenting, A fib on rate control, angina who p/w chest pain. This morning shortly after the patient ate breakfast at approximately 5 AM, the patient had a gradual onset of central, nonradiating, constant chest pain. The pain intensified and is currently 8/10 in intensity. She has chronic shortness of breath which is not new but states that it may be slightly worse today with chest pain. She denies any radiation of pain to her back or down her arm. No vomiting. She has had IBS symptoms yesterday and today which are not new. No blood in her stool. She took nitroglycerin twice at home without relief. She called EMS, who gave her aspirin and one nitroglycerin with no change in her pain. No fevers, cough/cold symptoms or recent illness. No changes in her medications. She is taking all of her medicines today.  Patient is a 81 y.o. female presenting with chest pain. The history is provided by the patient.  Chest Pain   Past Medical History  Diagnosis Date  . Insomnia     chronic  . IBS (irritable bowel syndrome)   . HTN (hypertension)   . Dyslipidemia   . Family history of cardiovascular disease   . PAF (paroxysmal atrial fibrillation)     coumadin was stopped secondary to GI Bleed, on amiodarone; monitor march 2014-NSR  . CAD S/P percutaneous coronary angioplasty     PCI x 2; once at [facility] and once in [place]  . Hypothyroidism   . Chest pain   . Dyspnea on exertion   . GERD (gastroesophageal reflux disease)   . Diverticulitis    Past Surgical History  Procedure Laterality Date  . Coronary angioplasty with stent placement  01/05/2011    2.5x15mm  Promus DES stent to RCA post dilated to 2.75x9mm   . Coronary angioplasty with stent placement  ?    3 stents per patient  . Appendectomy    . Cholecystectomy    . Abdominal hysterectomy     Family History  Problem Relation Age of Onset  . Diabetes Maternal Aunt   . Diabetes Son   . Heart disease      Both sides of family  . Colon cancer Neg Hx    History  Substance Use Topics  . Smoking status: Never Smoker   . Smokeless tobacco: Never Used  . Alcohol Use: No   OB History    No data available     Review of Systems  Cardiovascular: Positive for chest pain.  10 Systems reviewed and are negative for acute change except as noted in the HPI.     Allergies  Nexium; Statins; Codeine; Morphine and related; Penicillins; and Sulfa antibiotics  Home Medications   Prior to Admission medications   Medication Sig Start Date End Date Taking? Authorizing Provider  amiodarone (PACERONE) 200 MG tablet One tablet once daily and one tablet as needed for fast heart rate Patient taking differently: Take 200 mg by mouth daily. One tablet once daily and one tablet as needed for fast heart rate 02/25/15  Yes Leonie Man, MD  amLODipine (NORVASC) 5 MG tablet Take 1 tablet (5 mg total) by mouth daily. 09/09/14  Yes Margarita Mail, PA-C  Ascorbic Acid (VITAMIN C PO) Take 1 tablet by mouth daily.    Yes Historical Provider, MD  aspirin EC 81 MG tablet Take 1 tablet (81 mg total) by mouth daily. 02/15/15  Yes Leonie Man, MD  atenolol (TENORMIN) 25 MG tablet Take 25 mg by mouth daily.  02/28/14  Yes Historical Provider, MD  Cholecalciferol (VITAMIN D PO) Take 1 tablet by mouth daily.    Yes Historical Provider, MD  ferrous sulfate 325 (65 FE) MG tablet Take one po BID Patient taking differently: Take 325 mg by mouth. Take one po BID 04/06/14  Yes Jessica D Zehr, PA-C  FLUOCINOLONE ACETONIDE SCALP 0.01 % OIL Apply 1 application topically 2 (two) times a week. No specific days 12/21/14  Yes  Historical Provider, MD  furosemide (LASIX) 20 MG tablet Take 1 tablet (20 mg total) by mouth daily as needed for edema (as needed for leg swelling.). 03/29/15  Yes Leonie Man, MD  isosorbide mononitrate (IMDUR) 30 MG 24 hr tablet Take 1 tablet (30 mg total) by mouth daily. 09/09/14  Yes Margarita Mail, PA-C  levothyroxine (SYNTHROID, LEVOTHROID) 100 MCG tablet Take 100 mcg by mouth daily before breakfast.   Yes Historical Provider, MD  Multiple Vitamin (MULTIVITAMIN) capsule Take 1 capsule by mouth daily.   Yes Historical Provider, MD  nitroGLYCERIN (NITROSTAT) 0.4 MG SL tablet Place 1 tablet (0.4 mg total) under the tongue every 5 (five) minutes as needed for chest pain. 11/20/14  Yes Lorretta Harp, MD  pantoprazole (PROTONIX) 40 MG tablet Take 40 mg by mouth daily.    Yes Historical Provider, MD  Polyethyl Glycol-Propyl Glycol (SYSTANE) 0.4-0.3 % SOLN Apply 2 drops to eye at bedtime.   Yes Historical Provider, MD  pravastatin (PRAVACHOL) 10 MG tablet Take 10 mg by mouth daily.  09/11/13  Yes Historical Provider, MD  zolpidem (AMBIEN CR) 12.5 MG CR tablet Take 12.5 mg by mouth every other day.   Yes Historical Provider, MD   BP 166/61 mmHg  Pulse 58  Temp(Src) 97.6 F (36.4 C) (Oral)  Resp 14  Wt 161 lb (73.029 kg)  SpO2 91% Physical Exam  Constitutional: She is oriented to person, place, and time. She appears well-developed and well-nourished. No distress.  HENT:  Head: Normocephalic and atraumatic.  Moist mucous membranes  Eyes: Conjunctivae are normal. Pupils are equal, round, and reactive to light.  Neck: Neck supple.  Cardiovascular: Regular rhythm and normal heart sounds.   No murmur heard. bradycardic  Pulmonary/Chest: Effort normal and breath sounds normal.  Abdominal: Soft. Bowel sounds are normal. She exhibits no distension. There is no tenderness.  Musculoskeletal: She exhibits no edema.  Neurological: She is alert and oriented to person, place, and time.  Fluent  speech  Skin: Skin is warm and dry.  Psychiatric: She has a normal mood and affect. Judgment normal.  pleasant  Nursing note and vitals reviewed.   ED Course  Procedures (including critical care time) Labs Review Labs Reviewed  BASIC METABOLIC PANEL - Abnormal; Notable for the following:    Glucose, Bld 101 (*)    Creatinine, Ser 1.39 (*)    GFR calc non Af Amer 34 (*)    GFR calc Af Amer 39 (*)    All other components within normal limits  URINALYSIS, ROUTINE W REFLEX MICROSCOPIC (NOT AT [facility]) - Abnormal; Notable for the following:    Leukocytes, UA MODERATE (*)    All other components within normal  limits  BRAIN NATRIURETIC PEPTIDE - Abnormal; Notable for the following:    B Natriuretic Peptide 281.5 (*)    All other components within normal limits  CBC  TROPONIN I  TROPONIN I  URINE MICROSCOPIC-ADD ON  TROPONIN I  Randolm Idol, ED    Imaging Review Dg Chest 2 View  05/10/2015   CLINICAL DATA:  Acute onset of left-sided chest pain. Shortness of breath. Initial encounter.  EXAM: CHEST  2 VIEW  COMPARISON:  Chest radiograph performed 09/08/2014  FINDINGS: The lungs are well-aerated. Mild vascular congestion is noted. Minimal left basilar atelectasis is seen. There is no evidence of pleural effusion or pneumothorax.  The heart is borderline enlarged. No acute osseous abnormalities are seen.  IMPRESSION: Mild vascular congestion and borderline cardiomegaly. Minimal left basilar atelectasis noted.   Electronically Signed   By: Garald Balding M.D.   On: 05/10/2015 18:20     EKG Interpretation   Date/Time:  Monday May 10 2015 17:19:26 EDT Ventricular Rate:  63 PR Interval:  193 QRS Duration: 96 QT Interval:  504 QTC Calculation: 516 R Axis:   29 Text Interpretation:  Sinus rhythm Prolonged QT interval Baseline wander  in lead(s) II III aVF Confirmed by Cedrica Brune MD, Devann Cribb ([id number]) on 05/11/2015  1:12:42 AM      Medications  nitroGLYCERIN (NITROGLYN) 2 % ointment 0.5  inch (0.5 inches Topical Given 05/10/15 1750)  nitroGLYCERIN (NITROGLYN) 2 % ointment 1 inch (1 inch Topical Given 05/11/15 0100)    MDM   Final diagnoses:  Other chest pain    81 year old female with extensive cardiac history who presents with constant chest pain that began this morning after breakfast. No relief with nitroglycerin 3. Patient received aspirin during EMS transport. At presentation, the patient was well-appearing without any distress. Vital signs notable for hypertension at 172/76. No focal abnormalities on physical exam. Obtained labs listed above as well as EKG and chest x-ray.  EKG showed no ischemic changes. Labs notable only for mild kidney disease at 1.39 which is just slightly elevated from patient's baseline. Chest x-ray showed mild vascular congestion and borderline cardiomegaly, concerning for heart failure. Obtained a BNP which was 280. Patient's chest pain was not improved with nitroglycerin. She remained hypertensive with systolics in the 355H to 741U. Patient denies any history of blood clots, recent immobilization, or leg pain. Based on the description of her symptoms, I am less concerned about PE. However, her ongoing CP in setting of known CAD and CXR suggestive of mild volume overload, the patient requires admission for chest pain rule out and further cardiac workup.   Sharlett Iles, MD 05/11/15 [phone]

## 2015-05-11 DIAGNOSIS — I2510 Atherosclerotic heart disease of native coronary artery without angina pectoris: Secondary | ICD-10-CM | POA: Diagnosis not present

## 2015-05-11 DIAGNOSIS — I10 Essential (primary) hypertension: Secondary | ICD-10-CM

## 2015-05-11 DIAGNOSIS — R0789 Other chest pain: Secondary | ICD-10-CM | POA: Diagnosis not present

## 2015-05-11 DIAGNOSIS — R079 Chest pain, unspecified: Secondary | ICD-10-CM | POA: Insufficient documentation

## 2015-05-11 DIAGNOSIS — E039 Hypothyroidism, unspecified: Secondary | ICD-10-CM | POA: Diagnosis not present

## 2015-05-11 DIAGNOSIS — Z9861 Coronary angioplasty status: Secondary | ICD-10-CM

## 2015-05-11 DIAGNOSIS — I480 Paroxysmal atrial fibrillation: Secondary | ICD-10-CM

## 2015-05-11 LAB — CBC WITH DIFFERENTIAL/PLATELET
BASOS ABS: 0 10*3/uL (ref 0.0–0.1)
Basophils Relative: 0 % (ref 0–1)
Eosinophils Absolute: 0.1 10*3/uL (ref 0.0–0.7)
Eosinophils Relative: 2 % (ref 0–5)
HCT: 41.2 % (ref 36.0–46.0)
Hemoglobin: 13.7 g/dL (ref 12.0–15.0)
LYMPHS ABS: 1.1 10*3/uL (ref 0.7–4.0)
Lymphocytes Relative: 19 % (ref 12–46)
MCH: 30.9 pg (ref 26.0–34.0)
MCHC: 33.3 g/dL (ref 30.0–36.0)
MCV: 93 fL (ref 78.0–100.0)
MONO ABS: 0.5 10*3/uL (ref 0.1–1.0)
Monocytes Relative: 9 % (ref 3–12)
NEUTROS ABS: 3.8 10*3/uL (ref 1.7–7.7)
NEUTROS PCT: 70 % (ref 43–77)
Platelets: 169 10*3/uL (ref 150–400)
RBC: 4.43 MIL/uL (ref 3.87–5.11)
RDW: 14.1 % (ref 11.5–15.5)
WBC: 5.5 10*3/uL (ref 4.0–10.5)

## 2015-05-11 LAB — COMPREHENSIVE METABOLIC PANEL
ALT: 36 U/L (ref 14–54)
AST: 46 U/L — AB (ref 15–41)
Albumin: 3.6 g/dL (ref 3.5–5.0)
Alkaline Phosphatase: 95 U/L (ref 38–126)
Anion gap: 10 (ref 5–15)
BILIRUBIN TOTAL: 1 mg/dL (ref 0.3–1.2)
BUN: 14 mg/dL (ref 6–20)
CALCIUM: 9.5 mg/dL (ref 8.9–10.3)
CO2: 30 mmol/L (ref 22–32)
Chloride: 102 mmol/L (ref 101–111)
Creatinine, Ser: 1.2 mg/dL — ABNORMAL HIGH (ref 0.44–1.00)
GFR calc non Af Amer: 40 mL/min — ABNORMAL LOW (ref >60)
GFR, EST AFRICAN AMERICAN: 46 mL/min — AB (ref >60)
Glucose, Bld: 90 mg/dL (ref 65–99)
Potassium: 3.6 mmol/L (ref 3.5–5.1)
SODIUM: 142 mmol/L (ref 135–145)
TOTAL PROTEIN: 7.4 g/dL (ref 6.5–8.1)

## 2015-05-11 LAB — PROTIME-INR
INR: 0.99 (ref 0.00–1.49)
PROTHROMBIN TIME: 13.3 s (ref 11.6–15.2)

## 2015-05-11 LAB — TROPONIN I

## 2015-05-11 MED ORDER — FERROUS SULFATE 325 (65 FE) MG PO TABS
325.0000 mg | ORAL_TABLET | Freq: Every day | ORAL | Status: DC
  Administered 2015-05-11: 325 mg via ORAL
  Filled 2015-05-11: qty 1

## 2015-05-11 MED ORDER — PANTOPRAZOLE SODIUM 40 MG PO TBEC
40.0000 mg | DELAYED_RELEASE_TABLET | Freq: Two times a day (BID) | ORAL | Status: DC
  Administered 2015-05-11: 40 mg via ORAL
  Filled 2015-05-11: qty 1

## 2015-05-11 MED ORDER — AMLODIPINE BESYLATE 10 MG PO TABS: 10.0000 mg | ORAL_TABLET | Freq: Every day | ORAL | Status: DC

## 2015-05-11 MED ORDER — PRAVASTATIN SODIUM 20 MG PO TABS
10.0000 mg | ORAL_TABLET | Freq: Every day | ORAL | Status: DC
  Administered 2015-05-11: 10 mg via ORAL
  Filled 2015-05-11: qty 1

## 2015-05-11 MED ORDER — ISOSORBIDE MONONITRATE ER 30 MG PO TB24
30.0000 mg | ORAL_TABLET | Freq: Every day | ORAL | Status: DC
  Administered 2015-05-11: 30 mg via ORAL
  Filled 2015-05-11: qty 1

## 2015-05-11 MED ORDER — ACETAMINOPHEN 325 MG PO TABS: 650.0000 mg | ORAL_TABLET | ORAL | Status: DC | PRN

## 2015-05-11 MED ORDER — LEVOTHYROXINE SODIUM 100 MCG PO TABS
100.0000 ug | ORAL_TABLET | Freq: Every day | ORAL | Status: DC
  Administered 2015-05-11: 100 ug via ORAL
  Filled 2015-05-11: qty 1

## 2015-05-11 MED ORDER — ONDANSETRON HCL 4 MG/2ML IJ SOLN: 4.0000 mg | Freq: Four times a day (QID) | INTRAMUSCULAR | Status: DC | PRN

## 2015-05-11 MED ORDER — ATENOLOL 25 MG PO TABS
25.0000 mg | ORAL_TABLET | Freq: Every day | ORAL | Status: DC
  Administered 2015-05-11: 25 mg via ORAL
  Filled 2015-05-11: qty 1

## 2015-05-11 MED ORDER — ASPIRIN EC 81 MG PO TBEC
81.0000 mg | DELAYED_RELEASE_TABLET | Freq: Every day | ORAL | Status: DC
  Administered 2015-05-11: 81 mg via ORAL
  Filled 2015-05-11: qty 1

## 2015-05-11 MED ORDER — FUROSEMIDE 20 MG PO TABS
20.0000 mg | ORAL_TABLET | Freq: Every day | ORAL | Status: DC
  Administered 2015-05-11: 20 mg via ORAL
  Filled 2015-05-11: qty 1

## 2015-05-11 MED ORDER — ZOLPIDEM TARTRATE 5 MG PO TABS: 5.0000 mg | ORAL_TABLET | Freq: Every evening | ORAL | Status: DC | PRN

## 2015-05-11 MED ORDER — ENOXAPARIN SODIUM 30 MG/0.3ML ~~LOC~~ SOLN
30.0000 mg | SUBCUTANEOUS | Status: DC
  Administered 2015-05-11: 30 mg via SUBCUTANEOUS
  Filled 2015-05-11: qty 0.3

## 2015-05-11 MED ORDER — GI COCKTAIL ~~LOC~~: 30.0000 mL | Freq: Four times a day (QID) | ORAL | Status: DC | PRN

## 2015-05-11 MED ORDER — AMLODIPINE BESYLATE 5 MG PO TABS
5.0000 mg | ORAL_TABLET | Freq: Every day | ORAL | Status: DC
  Administered 2015-05-11 (×2): 5 mg via ORAL
  Filled 2015-05-11 (×2): qty 1

## 2015-05-11 MED ORDER — AMIODARONE HCL 200 MG PO TABS
200.0000 mg | ORAL_TABLET | Freq: Every day | ORAL | Status: DC
Start: 2015-05-11 — End: 2015-08-27

## 2015-05-11 MED ORDER — AMIODARONE HCL 200 MG PO TABS
200.0000 mg | ORAL_TABLET | Freq: Every day | ORAL | Status: DC
  Administered 2015-05-11: 200 mg via ORAL
  Filled 2015-05-11: qty 1

## 2015-05-11 MED ORDER — POLYETHYL GLYCOL-PROPYL GLYCOL 0.4-0.3 % OP SOLN: 2.0000 [drp] | Freq: Every day | OPHTHALMIC | Status: DC

## 2015-05-11 MED ORDER — NITROGLYCERIN 2 % TD OINT
1.0000 [in_us] | TOPICAL_OINTMENT | Freq: Once | TRANSDERMAL | Status: AC
  Administered 2015-05-11: 1 [in_us] via TOPICAL
  Filled 2015-05-11: qty 1

## 2015-05-11 NOTE — ED Notes (Signed)
Dr. Rex Kras at bedside at this time with patient and family.

## 2015-05-11 NOTE — Consult Note (Signed)
CONSULT NOTE  Date: 05/11/2015               Patient Name:  Jeanne Hart MRN: [id number]  DOB: [birth year] Age / Sex: 81 y.o., female        PCP: Thressa Sheller Primary Cardiologist: Ellyn Hack             Referring Physician: Sloan Leiter              Reason for Consult: Chest pain            History of Present Illness: Patient is a 81 y.o. female with a PMHx of coronary artery disease-status post PCI 3.  She also has a history of hypertension, dyslipidemia, paroxysmal  atrial fibrillation, and hypothyroidism., who was admitted to [facility] on 05/10/2015 for evaluation of chest discomfort..   She described having a piercing pain last night. The pain was clearly different from her previous episodes of angina. It was not relieved with sublingual nitroglycerin. The pain eventually resolved by this morning.  Her troponins are negative. Her EKG is unremarkable.   Medications: Outpatient medications: Prescriptions prior to admission  Medication Sig Dispense Refill Last Dose  . amiodarone (PACERONE) 200 MG tablet One tablet once daily and one tablet as needed for fast heart rate (Patient taking differently: Take 200 mg by mouth daily. One tablet once daily and one tablet as needed for fast heart rate) 45 tablet 6 05/10/2015 at Unknown time  . amLODipine (NORVASC) 5 MG tablet Take 1 tablet (5 mg total) by mouth daily. 30 tablet 0 05/10/2015 at Unknown time  . Ascorbic Acid (VITAMIN C PO) Take 1 tablet by mouth daily.    05/10/2015 at Unknown time  . aspirin EC 81 MG tablet Take 1 tablet (81 mg total) by mouth daily. 90 tablet 3 05/10/2015 at Unknown time  . atenolol (TENORMIN) 25 MG tablet Take 25 mg by mouth daily.    05/10/2015 at 0830  . Cholecalciferol (VITAMIN D PO) Take 1 tablet by mouth daily.    05/10/2015 at Unknown time  . ferrous sulfate 325 (65 FE) MG tablet Take one po BID (Patient taking differently: Take 325 mg by mouth. Take one po BID) 60 tablet 3 05/10/2015 at Unknown time  . FLUOCINOLONE  ACETONIDE SCALP 0.01 % OIL Apply 1 application topically 2 (two) times a week. No specific days  2 05/10/2015 at Unknown time  . furosemide (LASIX) 20 MG tablet Take 1 tablet (20 mg total) by mouth daily as needed for edema (as needed for leg swelling.). 90 tablet 3 Past Week at Unknown time  . isosorbide mononitrate (IMDUR) 30 MG 24 hr tablet Take 1 tablet (30 mg total) by mouth daily. 30 tablet 0 05/10/2015 at Unknown time  . levothyroxine (SYNTHROID, LEVOTHROID) 100 MCG tablet Take 100 mcg by mouth daily before breakfast.   05/10/2015 at Unknown time  . Multiple Vitamin (MULTIVITAMIN) capsule Take 1 capsule by mouth daily.   05/10/2015 at Unknown time  . nitroGLYCERIN (NITROSTAT) 0.4 MG SL tablet Place 1 tablet (0.4 mg total) under the tongue every 5 (five) minutes as needed for chest pain. 25 tablet 1 05/10/2015 at Unknown time  . pantoprazole (PROTONIX) 40 MG tablet Take 40 mg by mouth daily.    05/10/2015 at Unknown time  . Polyethyl Glycol-Propyl Glycol (SYSTANE) 0.4-0.3 % SOLN Apply 2 drops to eye at bedtime.   05/09/2015 at Unknown time  . pravastatin (PRAVACHOL) 10 MG tablet Take 10 mg by  mouth daily.    05/10/2015 at Unknown time  . zolpidem (AMBIEN CR) 12.5 MG CR tablet Take 12.5 mg by mouth every other day.   Past Week at Unknown time    Current medications: Current Facility-Administered Medications  Medication Dose Route Frequency Provider Last Rate Last Dose  . acetaminophen (TYLENOL) tablet 650 mg  650 mg Oral Q4H PRN Lavina Hamman, MD      . amiodarone (PACERONE) tablet 200 mg  200 mg Oral Daily Lavina Hamman, MD      . amLODipine (NORVASC) tablet 5 mg  5 mg Oral Daily Lavina Hamman, MD   5 mg at 05/11/15 0336  . aspirin EC tablet 81 mg  81 mg Oral Daily Lavina Hamman, MD      . atenolol (TENORMIN) tablet 25 mg  25 mg Oral Daily Lavina Hamman, MD      . enoxaparin (LOVENOX) injection 30 mg  30 mg Subcutaneous Q24H Lavina Hamman, MD      . ferrous sulfate tablet 325 mg  325 mg Oral Q  breakfast Lavina Hamman, MD      . furosemide (LASIX) tablet 20 mg  20 mg Oral Daily Lavina Hamman, MD      . gi cocktail (Maalox,Lidocaine,Donnatal)  30 mL Oral QID PRN Lavina Hamman, MD      . isosorbide mononitrate (IMDUR) 24 hr tablet 30 mg  30 mg Oral Daily Lavina Hamman, MD      . levothyroxine (SYNTHROID, LEVOTHROID) tablet 100 mcg  100 mcg Oral QAC breakfast Lavina Hamman, MD      . ondansetron [facility]) injection 4 mg  4 mg Intravenous Q6H PRN Lavina Hamman, MD      . pantoprazole (PROTONIX) EC tablet 40 mg  40 mg Oral BID AC Lavina Hamman, MD      . Polyethyl Glycol-Propyl Glycol 0.4-0.3 % SOLN 2 drop  2 drop Ophthalmic QHS Lavina Hamman, MD      . pravastatin (PRAVACHOL) tablet 10 mg  10 mg Oral Daily Lavina Hamman, MD      . zolpidem (AMBIEN) tablet 5 mg  5 mg Oral QHS PRN Lavina Hamman, MD         Allergies  Allergen Reactions  . Nexium [Esomeprazole] Other (See Comments)    Aggravates IBS.   . Statins Other (See Comments)    Had jaundice as child.  Does tolerate current statin therapy.    . Codeine Other (See Comments)    Post-surgical reaction of unknown type.  . Morphine And Related Other (See Comments)    Unknown.    Marland Kitchen Penicillins Other (See Comments)    Unknown.  . Sulfa Antibiotics Other (See Comments)    Unknown childhood reaction.       Past Medical History  Diagnosis Date  . Insomnia     chronic  . IBS (irritable bowel syndrome)   . HTN (hypertension)   . Dyslipidemia   . Family history of cardiovascular disease   . PAF (paroxysmal atrial fibrillation)     coumadin was stopped secondary to GI Bleed, on amiodarone; monitor march 2014-NSR  . CAD S/P percutaneous coronary angioplasty     PCI x 2; once at [facility] and once in [place]  . Hypothyroidism   . Chest pain   . Dyspnea on exertion   . GERD (gastroesophageal reflux disease)   . Diverticulitis     Past Surgical  History  Procedure Laterality Date  . Coronary angioplasty with stent  placement  01/05/2011    2.5x46mm Promus DES stent to RCA post dilated to 2.75x67mm   . Coronary angioplasty with stent placement  ?    3 stents per patient  . Appendectomy    . Cholecystectomy    . Abdominal hysterectomy      Family History  Problem Relation Age of Onset  . Diabetes Maternal Aunt   . Diabetes Son   . Heart disease      Both sides of family  . Colon cancer Neg Hx     Social History:  reports that she has never smoked. She has never used smokeless tobacco. She reports that she does not drink alcohol or use illicit drugs.   Review of Systems: Constitutional:  denies fever, chills, diaphoresis, appetite change and fatigue.  HEENT: denies photophobia, eye pain, redness, hearing loss, ear pain, congestion, sore throat, rhinorrhea, sneezing, neck pain, neck stiffness and tinnitus.  Respiratory: denies SOB, DOE, cough, chest tightness, and wheezing.  Cardiovascular: admits to chest pain,    Gastrointestinal: denies nausea, vomiting, abdominal pain, diarrhea, constipation, blood in stool.  Genitourinary: denies dysuria, urgency, frequency, hematuria, flank pain and difficulty urinating.  Musculoskeletal: denies  myalgias, back pain, joint swelling, arthralgias and gait problem.   Skin: denies pallor, rash and wound.  Neurological: denies dizziness, seizures, syncope, weakness, light-headedness, numbness and headaches.   Hematological: denies adenopathy, easy bruising, personal or family bleeding history.  Psychiatric/ Behavioral: denies suicidal ideation, mood changes, confusion, nervousness, sleep disturbance and agitation.    Physical Exam: BP 172/66 mmHg  Pulse 61  Temp(Src) 98.1 F (36.7 C) (Oral)  Resp 18  Ht 5\' 6"  (1.676 m)  Wt 70.4 kg (155 lb 3.3 oz)  BMI 25.06 kg/m2  SpO2 100%  Wt Readings from Last 3 Encounters:  05/11/15 70.4 kg (155 lb 3.3 oz)  02/15/15 73.619 kg (162 lb 4.8 oz)  12/08/14 75.206 kg (165 lb 12.8 oz)    General: Vital signs  reviewed and noted. Well-developed, well-nourished, in no acute distress; alert,   Head: Normocephalic, atraumatic, sclera anicteric,   Neck: Supple. Negative for carotid bruits. No JVD   Lungs:  Clear bilaterally, no  wheezes, rales, or rhonchi. Breathing is normal   Heart: RRR with S1 S2. No murmurs, rubs, or gallops   Abdomen/ GI :  Soft, non-tender, non-distended with normoactive bowel sounds. No hepatomegaly. No rebound/guarding. No obvious abdominal masses   MSK: Strength and the appear normal for age.   Extremities: No clubbing or cyanosis. No edema.  Distal pedal pulses are 2+ and equal   Neurologic:  CN are grossly intact,  No obvious motor or sensory defect.  Alert and oriented X 3. Moves all extremities spontaneously.  Psych: Responds to questions appropriately with a normal affect.     Lab results: Basic Metabolic Panel:  Recent Labs Lab 05/10/15 1726 05/11/15 0340  NA 139 142  K 3.8 3.6  CL 103 102  CO2 29 30  GLUCOSE 101* 90  BUN 18 14  CREATININE 1.39* 1.20*  CALCIUM 9.4 9.5    Liver Function Tests:  Recent Labs Lab 05/11/15 0340  AST 46*  ALT 36  ALKPHOS 95  BILITOT 1.0  PROT 7.4  ALBUMIN 3.6   No results for input(s): LIPASE, AMYLASE in the last 168 hours. No results for input(s): AMMONIA in the last 168 hours.  CBC:  Recent Labs Lab 05/10/15 1726 05/11/15 0340  WBC 4.3 5.5  NEUTROABS  --  3.8  HGB 13.1 13.7  HCT 39.0 41.2  MCV 92.6 93.0  PLT 158 169    Cardiac Enzymes:  Recent Labs Lab 05/10/15 1830 05/10/15 1945 05/10/15 2143 05/11/15 0340  TROPONINI <0.03 <0.03 <0.03 <0.03    BNP: Invalid input(s): POCBNP  CBG: No results for input(s): GLUCAP in the last 168 hours.  Coagulation Studies:  Recent Labs  05/11/15 0340  LABPROT 13.3  INR 0.99     Other results: Personal review of EKG shows :  NSR at 63.  No ST or T wave changes.   Imaging: Dg Chest 2 View  05/10/2015   CLINICAL DATA:  Acute onset of left-sided  chest pain. Shortness of breath. Initial encounter.  EXAM: CHEST  2 VIEW  COMPARISON:  Chest radiograph performed 09/08/2014  FINDINGS: The lungs are well-aerated. Mild vascular congestion is noted. Minimal left basilar atelectasis is seen. There is no evidence of pleural effusion or pneumothorax.  The heart is borderline enlarged. No acute osseous abnormalities are seen.  IMPRESSION: Mild vascular congestion and borderline cardiomegaly. Minimal left basilar atelectasis noted.   Electronically Signed   By: Garald Balding M.D.   On: 05/10/2015 18:20       Assessment & Plan:  1. Chest discomfort: The patient's chest pain clearly different from her typical angina. It was not relieved with nitroglycerin glycerin. Her troponin levels are negative. Her EKG is unremarkable. I think that she can be safely discharged to home after this period of observation. I do not think that she needs an echocardiogram.  2. Paroxysmal Atrial fibrillation:  She has maintained normal sinus rhythm. Continue current medications. She will follow-up with Dr. Ellyn Hack.  3.   Essential hypertension: Her blood pressure is a little high at this point. We will resume her normal routine. She can follow-up with Dr. Ellyn Hack.     Thayer Headings, Brooke Bonito., MD, [facility] 05/11/2015, 8:18 AM Office - [phone] Pager [phone]

## 2015-05-11 NOTE — Discharge Summary (Signed)
PATIENT DETAILS Name: Jeanne Hart Age: 81 y.o. Sex: female Date of Birth: [birth year] MRN: [id number]. Admitting Physician: Lavina Hamman, MD NOM:[id number],JGGEZ, MD  Admit Date: 05/10/2015 Discharge date: 05/11/2015  Recommendations for Outpatient Follow-up:  1.  Amlodipine-increased to 10 mg-continue to optimize blood pressure in the outpatient setting.   PRIMARY DISCHARGE DIAGNOSIS:  Principal Problem:   Chest pain Active Problems:   Essential hypertension   Paroxysmal atrial fibrillation   Hypertensive urgency   Hypothyroidism   CAD S/P percutaneous coronary angioplasty      PAST MEDICAL HISTORY: Past Medical History  Diagnosis Date  . Insomnia     chronic  . IBS (irritable bowel syndrome)   . HTN (hypertension)   . Dyslipidemia   . Family history of cardiovascular disease   . PAF (paroxysmal atrial fibrillation)     coumadin was stopped secondary to GI Bleed, on amiodarone; monitor march 2014-NSR  . CAD S/P percutaneous coronary angioplasty     PCI x 2; once at [facility] and once in [place]  . Hypothyroidism   . Chest pain   . Dyspnea on exertion   . GERD (gastroesophageal reflux disease)   . Diverticulitis     DISCHARGE MEDICATIONS: Current Discharge Medication List    CONTINUE these medications which have CHANGED   Details  amiodarone (PACERONE) 200 MG tablet Take 1 tablet (200 mg total) by mouth daily. One tablet once daily and one tablet as needed for fast heart rate Qty: 45 tablet, Refills: 6    amLODipine (NORVASC) 10 MG tablet Take 1 tablet (10 mg total) by mouth daily. Qty: 30 tablet, Refills: 0      CONTINUE these medications which have NOT CHANGED   Details  Ascorbic Acid (VITAMIN C PO) Take 1 tablet by mouth daily.     aspirin EC 81 MG tablet Take 1 tablet (81 mg total) by mouth daily. Qty: 90 tablet, Refills: 3    atenolol (TENORMIN) 25 MG tablet Take 25 mg by mouth daily.     Cholecalciferol (VITAMIN D PO) Take 1 tablet by mouth  daily.     ferrous sulfate 325 (65 FE) MG tablet Take one po BID Qty: 60 tablet, Refills: 3    FLUOCINOLONE ACETONIDE SCALP 0.01 % OIL Apply 1 application topically 2 (two) times a week. No specific days Refills: 2    furosemide (LASIX) 20 MG tablet Take 1 tablet (20 mg total) by mouth daily as needed for edema (as needed for leg swelling.). Qty: 90 tablet, Refills: 3    isosorbide mononitrate (IMDUR) 30 MG 24 hr tablet Take 1 tablet (30 mg total) by mouth daily. Qty: 30 tablet, Refills: 0    levothyroxine (SYNTHROID, LEVOTHROID) 100 MCG tablet Take 100 mcg by mouth daily before breakfast.    Multiple Vitamin (MULTIVITAMIN) capsule Take 1 capsule by mouth daily.    nitroGLYCERIN (NITROSTAT) 0.4 MG SL tablet Place 1 tablet (0.4 mg total) under the tongue every 5 (five) minutes as needed for chest pain. Qty: 25 tablet, Refills: 1    pantoprazole (PROTONIX) 40 MG tablet Take 40 mg by mouth daily.     Polyethyl Glycol-Propyl Glycol (SYSTANE) 0.4-0.3 % SOLN Apply 2 drops to eye at bedtime.    pravastatin (PRAVACHOL) 10 MG tablet Take 10 mg by mouth daily.     zolpidem (AMBIEN CR) 12.5 MG CR tablet Take 12.5 mg by mouth every other day.        ALLERGIES:   Allergies  Allergen  Reactions  . Nexium [Esomeprazole] Other (See Comments)    Aggravates IBS.   . Statins Other (See Comments)    Had jaundice as child.  Does tolerate current statin therapy.    . Codeine Other (See Comments)    Post-surgical reaction of unknown type.  . Morphine And Related Other (See Comments)    Unknown.    Marland Kitchen Penicillins Other (See Comments)    Unknown.  . Sulfa Antibiotics Other (See Comments)    Unknown childhood reaction.      BRIEF HPI:  See H&P, Labs, Consult and Test reports for all details in brief, patient was admitted for evaluation of chest pain  CONSULTATIONS:   cardiology  PERTINENT RADIOLOGIC STUDIES: Dg Chest 2 View  05/10/2015   CLINICAL DATA:  Acute onset of left-sided chest  pain. Shortness of breath. Initial encounter.  EXAM: CHEST  2 VIEW  COMPARISON:  Chest radiograph performed 09/08/2014  FINDINGS: The lungs are well-aerated. Mild vascular congestion is noted. Minimal left basilar atelectasis is seen. There is no evidence of pleural effusion or pneumothorax.  The heart is borderline enlarged. No acute osseous abnormalities are seen.  IMPRESSION: Mild vascular congestion and borderline cardiomegaly. Minimal left basilar atelectasis noted.   Electronically Signed   By: Garald Balding M.D.   On: 05/10/2015 18:20     PERTINENT LAB RESULTS: CBC:  Recent Labs  05/10/15 1726 05/11/15 0340  WBC 4.3 5.5  HGB 13.1 13.7  HCT 39.0 41.2  PLT 158 169   CMET CMP     Component Value Date/Time   NA 142 05/11/2015 0340   K 3.6 05/11/2015 0340   CL 102 05/11/2015 0340   CO2 30 05/11/2015 0340   GLUCOSE 90 05/11/2015 0340   BUN 14 05/11/2015 0340   CREATININE 1.20* 05/11/2015 0340   CALCIUM 9.5 05/11/2015 0340   PROT 7.4 05/11/2015 0340   ALBUMIN 3.6 05/11/2015 0340   AST 46* 05/11/2015 0340   ALT 36 05/11/2015 0340   ALKPHOS 95 05/11/2015 0340   BILITOT 1.0 05/11/2015 0340   GFRNONAA 40* 05/11/2015 0340   GFRAA 46* 05/11/2015 0340    GFR Estimated Creatinine Clearance: 32.1 mL/min (by C-G formula based on Cr of 1.2). No results for input(s): LIPASE, AMYLASE in the last 72 hours.  Recent Labs  05/10/15 1945 05/10/15 2143 05/11/15 0340  TROPONINI <0.03 <0.03 <0.03   Invalid input(s): POCBNP No results for input(s): DDIMER in the last 72 hours. No results for input(s): HGBA1C in the last 72 hours. No results for input(s): CHOL, HDL, LDLCALC, TRIG, CHOLHDL, LDLDIRECT in the last 72 hours. No results for input(s): TSH, T4TOTAL, T3FREE, THYROIDAB in the last 72 hours.  Invalid input(s): FREET3 No results for input(s): VITAMINB12, FOLATE, FERRITIN, TIBC, IRON, RETICCTPCT in the last 72 hours. Coags:  Recent Labs  05/11/15 0340  INR 0.99    Microbiology: No results found for this or any previous visit (from the past 240 hour(s)).   BRIEF HOSPITAL COURSE:   Principal Problem:   Chest pain: Felt to be atypical, admitted and cardiac enzymes were cycled-negative. Evaluated by cardiology, felt to require no further workup. Cardiology recommending discharge and follow-up with her primary cardiologist.  Active Problems:   Essential hypertension: BP slightly uncontrolled-increase amlodipine to 10 mg, continue atenolol    Paroxysmal atrial fibrillation: Continue amiodarone-not on oral anticoagulation because of history of GI bleeding(CHADS2VASC of atleast 3). Continue aspirin.    Hypothyroidism: Continue levothyroxine   TODAY-DAY OF DISCHARGE:  Subjective:  Abagail Orban today has no headache,no chest abdominal pain,no new weakness tingling or numbness, feels much better wants to go home today.   Objective:   Blood pressure 159/60, pulse 61, temperature 98 F (36.7 C), temperature source Oral, resp. rate 18, height 5\' 6"  (1.676 m), weight 70.4 kg (155 lb 3.3 oz), SpO2 100 %.  Intake/Output Summary (Last 24 hours) at 05/11/15 1038 Last data filed at 05/11/15 0850  Gross per 24 hour  Intake    300 ml  Output    200 ml  Net    100 ml   Filed Weights   05/10/15 1707 05/11/15 0220  Weight: 73.029 kg (161 lb) 70.4 kg (155 lb 3.3 oz)    Exam Awake Alert, Oriented *3, No new F.N deficits, Normal affect [place].AT,PERRAL Supple Neck,No JVD, No cervical lymphadenopathy appriciated.  Symmetrical Chest wall movement, Good air movement bilaterally, CTAB RRR,No Gallops,Rubs or new Murmurs, No Parasternal Heave +ve B.Sounds, Abd Soft, Non tender, No organomegaly appriciated, No rebound -guarding or rigidity. No Cyanosis, Clubbing or edema, No new Rash or bruise  DISCHARGE CONDITION: Stable  DISPOSITION: Home  DISCHARGE INSTRUCTIONS:    Activity:  As tolerated   Diet recommendation: Heart Healthy diet  Discharge  Instructions    Call MD for:  severe uncontrolled pain    Complete by:  As directed      Diet - low sodium heart healthy    Complete by:  As directed      Increase activity slowly    Complete by:  As directed            Follow-up Information    Follow up with Thressa Sheller, MD. Schedule an appointment as soon as possible for a visit in 1 week.   Specialty:  Internal Medicine   Contact information:   [place], [place]  [address] [phone]       Follow up with [facility], Leonie Green, MD. Schedule an appointment as soon as possible for a visit in 2 weeks.   Specialty:  Cardiology   Contact information:   [address] [place] [address] [phone]      Total Time spent on discharge equals 25  minutes.  SignedOren Binet 05/11/2015 10:38 AM

## 2015-05-11 NOTE — H&P (Signed)
[facility] History and Physical  Patient: Jeanne Hart  MRN: [id number]  DOB: Sep 13, 1927  DOS: the patient was seen and examined on 05/11/2015 PCP: Thressa Sheller, MD  Referring physician: Dr. Rex Kras Chief Complaint: Chest pain  HPI: Jeanne Hart is a 81 y.o. female with Past medical history of coronary artery disease status post PCI in 20123, hypertension, dyslipidemia, A. fib, hypothyroidism. The patient is presenting with complaints of chest pain. The pain is located parasternally on the left side and can be pinpointed. The pain feels like a sharp stabbing pain. This is also associated with nausea as well as shortness of breath. She did have some diaphoresis. She denies any change in her medications. She mentions she is compliant with all her medications. Denies any recent travel recent surgery recent procedure. Denies any worsening leg swelling. She had multiple ER visits in the past for chest pain. She had a negative stress test on 2014. She also complains of some diarrhea which is on and off due to her IBS. She denies any burning urination no cough no fever no chills. No focal deficit.  The patient is coming from home.  At her baseline ambulates without any support And is independent for most of her ADL manages her medication on her own.  Review of Systems: as mentioned in the history of present illness.  A comprehensive review of the other systems is negative.  Past Medical History  Diagnosis Date  . Insomnia     chronic  . IBS (irritable bowel syndrome)   . HTN (hypertension)   . Dyslipidemia   . Family history of cardiovascular disease   . PAF (paroxysmal atrial fibrillation)     coumadin was stopped secondary to GI Bleed, on amiodarone; monitor march 2014-NSR  . CAD S/P percutaneous coronary angioplasty     PCI x 2; once at [facility] and once in [place]  . Hypothyroidism   . Chest pain   . Dyspnea on exertion   . GERD (gastroesophageal reflux  disease)   . Diverticulitis    Past Surgical History  Procedure Laterality Date  . Coronary angioplasty with stent placement  01/05/2011    2.5x47mm Promus DES stent to RCA post dilated to 2.75x33mm   . Coronary angioplasty with stent placement  ?    3 stents per patient  . Appendectomy    . Cholecystectomy    . Abdominal hysterectomy     Social History:  reports that she has never smoked. She has never used smokeless tobacco. She reports that she does not drink alcohol or use illicit drugs.  Allergies  Allergen Reactions  . Nexium [Esomeprazole] Other (See Comments)    Aggravates IBS.   . Statins Other (See Comments)    Had jaundice as child.  Does tolerate current statin therapy.    . Codeine Other (See Comments)    Post-surgical reaction of unknown type.  . Morphine And Related Other (See Comments)    Unknown.    Marland Kitchen Penicillins Other (See Comments)    Unknown.  . Sulfa Antibiotics Other (See Comments)    Unknown childhood reaction.      Family History  Problem Relation Age of Onset  . Diabetes Maternal Aunt   . Diabetes Son   . Heart disease      Both sides of family  . Colon cancer Neg Hx     Prior to Admission medications   Medication Sig Start Date End Date Taking? Authorizing Provider  amiodarone (PACERONE) 200  MG tablet One tablet once daily and one tablet as needed for fast heart rate Patient taking differently: Take 200 mg by mouth daily. One tablet once daily and one tablet as needed for fast heart rate 02/25/15  Yes Leonie Man, MD  amLODipine (NORVASC) 5 MG tablet Take 1 tablet (5 mg total) by mouth daily. 09/09/14  Yes Margarita Mail, PA-C  Ascorbic Acid (VITAMIN C PO) Take 1 tablet by mouth daily.    Yes Historical Provider, MD  aspirin EC 81 MG tablet Take 1 tablet (81 mg total) by mouth daily. 02/15/15  Yes Leonie Man, MD  atenolol (TENORMIN) 25 MG tablet Take 25 mg by mouth daily.  02/28/14  Yes Historical Provider, MD  Cholecalciferol (VITAMIN D  PO) Take 1 tablet by mouth daily.    Yes Historical Provider, MD  ferrous sulfate 325 (65 FE) MG tablet Take one po BID Patient taking differently: Take 325 mg by mouth. Take one po BID 04/06/14  Yes Jessica D Zehr, PA-C  FLUOCINOLONE ACETONIDE SCALP 0.01 % OIL Apply 1 application topically 2 (two) times a week. No specific days 12/21/14  Yes Historical Provider, MD  furosemide (LASIX) 20 MG tablet Take 1 tablet (20 mg total) by mouth daily as needed for edema (as needed for leg swelling.). 03/29/15  Yes Leonie Man, MD  isosorbide mononitrate (IMDUR) 30 MG 24 hr tablet Take 1 tablet (30 mg total) by mouth daily. 09/09/14  Yes Margarita Mail, PA-C  levothyroxine (SYNTHROID, LEVOTHROID) 100 MCG tablet Take 100 mcg by mouth daily before breakfast.   Yes Historical Provider, MD  Multiple Vitamin (MULTIVITAMIN) capsule Take 1 capsule by mouth daily.   Yes Historical Provider, MD  nitroGLYCERIN (NITROSTAT) 0.4 MG SL tablet Place 1 tablet (0.4 mg total) under the tongue every 5 (five) minutes as needed for chest pain. 11/20/14  Yes Lorretta Harp, MD  pantoprazole (PROTONIX) 40 MG tablet Take 40 mg by mouth daily.    Yes Historical Provider, MD  Polyethyl Glycol-Propyl Glycol (SYSTANE) 0.4-0.3 % SOLN Apply 2 drops to eye at bedtime.   Yes Historical Provider, MD  pravastatin (PRAVACHOL) 10 MG tablet Take 10 mg by mouth daily.  09/11/13  Yes Historical Provider, MD  zolpidem (AMBIEN CR) 12.5 MG CR tablet Take 12.5 mg by mouth every other day.   Yes Historical Provider, MD    Physical Exam: Filed Vitals:   05/11/15 0130 05/11/15 0145 05/11/15 0220 05/11/15 0333  BP: 155/59 118/84 195/96 172/66  Pulse: 61 57 63 61  Temp:   97.7 F (36.5 C) 98.1 F (36.7 C)  TempSrc:   Oral Oral  Resp: 20 19 20 18   Height:   5\' 6"  (1.676 m)   Weight:   70.4 kg (155 lb 3.3 oz)   SpO2: 95% 89% 99% 100%    General: Alert, Awake and Oriented to Time, Place and Person. Appear in mild distress Eyes: PERRL ENT: Oral  Mucosa clear moist. Neck: no JVD Cardiovascular: S1 and S2 Present, no Murmur, Peripheral Pulses Present Respiratory: Bilateral Air entry equal and Decreased,  Clear to Auscultation, no Crackles, no wheezes Abdomen: Bowel Sound  present , Soft and non tender Skin: no Rash Extremities: no Pedal edema, no calf tenderness Neurologic: Grossly no focal neuro deficit.  Labs on Admission:  CBC:  Recent Labs Lab 05/10/15 1726 05/11/15 0340  WBC 4.3 5.5  NEUTROABS  --  3.8  HGB 13.1 13.7  HCT 39.0 41.2  MCV 92.6 93.0  PLT 158 169    CMP     Component Value Date/Time   NA 142 05/11/2015 0340   K 3.6 05/11/2015 0340   CL 102 05/11/2015 0340   CO2 30 05/11/2015 0340   GLUCOSE 90 05/11/2015 0340   BUN 14 05/11/2015 0340   CREATININE 1.20* 05/11/2015 0340   CALCIUM 9.5 05/11/2015 0340   PROT 7.4 05/11/2015 0340   ALBUMIN 3.6 05/11/2015 0340   AST 46* 05/11/2015 0340   ALT 36 05/11/2015 0340   ALKPHOS 95 05/11/2015 0340   BILITOT 1.0 05/11/2015 0340   GFRNONAA 40* 05/11/2015 0340   GFRAA 46* 05/11/2015 0340    No results for input(s): LIPASE, AMYLASE in the last 168 hours.   Recent Labs Lab 05/10/15 1830 05/10/15 1945 05/10/15 2143 05/11/15 0340  TROPONINI <0.03 <0.03 <0.03 <0.03   BNP (last 3 results)  Recent Labs  05/10/15 2143  BNP 281.5*    ProBNP (last 3 results) No results for input(s): PROBNP in the last 8760 hours.   Radiological Exams on Admission: Dg Chest 2 View  05/10/2015   CLINICAL DATA:  Acute onset of left-sided chest pain. Shortness of breath. Initial encounter.  EXAM: CHEST  2 VIEW  COMPARISON:  Chest radiograph performed 09/08/2014  FINDINGS: The lungs are well-aerated. Mild vascular congestion is noted. Minimal left basilar atelectasis is seen. There is no evidence of pleural effusion or pneumothorax.  The heart is borderline enlarged. No acute osseous abnormalities are seen.  IMPRESSION: Mild vascular congestion and borderline cardiomegaly.  Minimal left basilar atelectasis noted.   Electronically Signed   By: Garald Balding M.D.   On: 05/10/2015 18:20   EKG: Independently reviewed. normal sinus rhythm, nonspecific ST and T waves changes.  Assessment/Plan Principal Problem:   Chest pain Active Problems:   Essential hypertension   Paroxysmal atrial fibrillation   Hypertensive urgency   Hypothyroidism   CAD S/P percutaneous coronary angioplasty   1. Chest pain The patient presents with chest pain her initial EKG and troponin does not show any signs of acute ischemia. her chest x-ray is showing no acute abnormality. At present I will admit the patient to the hospital for observation. I will obtain serial troponin every 6 hours, monitor on telemetry, get 2D echocardiogram in the morning to rule out ACS.  Continue with aspirin, beta blocker, statin, sublingual nitroglycerin.   2. Hypertensive urgency. Continue home medication using extra dose of nitroglycerin as well as when necessary hydralazine.  3. Hypothyroidism. Continuing low-dose Synthroid.  4. Chronic diastolic dysfunction. Patient is on Lasix as needed currently I will place him on scheduled.  5. Atrial fibrillation. Patient is not on any anticoagulation at present  Advance goals of care discussion: full code    DVT Prophylaxis: subcutaneous Heparin Nutrition: Nothing by mouth   Disposition: Admitted as observation telemetry unit.  Author: Berle Mull, MD [facility] Pager: [phone] 05/11/2015  If 7PM-7AM, please contact night-coverage [email or web address] Password [email or web address]

## 2015-05-27 DIAGNOSIS — G4700 Insomnia, unspecified: Secondary | ICD-10-CM | POA: Diagnosis not present

## 2015-05-27 DIAGNOSIS — Z23 Encounter for immunization: Secondary | ICD-10-CM | POA: Diagnosis not present

## 2015-05-27 DIAGNOSIS — K921 Melena: Secondary | ICD-10-CM | POA: Diagnosis not present

## 2015-05-27 DIAGNOSIS — D692 Other nonthrombocytopenic purpura: Secondary | ICD-10-CM | POA: Diagnosis not present

## 2015-05-28 ENCOUNTER — Encounter: Payer: Self-pay | Admitting: Nurse Practitioner

## 2015-05-28 ENCOUNTER — Ambulatory Visit (INDEPENDENT_AMBULATORY_CARE_PROVIDER_SITE_OTHER): Payer: Medicare Other | Admitting: Nurse Practitioner

## 2015-05-28 ENCOUNTER — Other Ambulatory Visit (INDEPENDENT_AMBULATORY_CARE_PROVIDER_SITE_OTHER): Payer: Medicare Other

## 2015-05-28 VITALS — BP 124/60 | HR 60 | Ht 66.0 in | Wt 159.0 lb

## 2015-05-28 DIAGNOSIS — I2510 Atherosclerotic heart disease of native coronary artery without angina pectoris: Secondary | ICD-10-CM | POA: Diagnosis not present

## 2015-05-28 DIAGNOSIS — R195 Other fecal abnormalities: Secondary | ICD-10-CM

## 2015-05-28 LAB — CBC
HCT: 40.1 % (ref 36.0–46.0)
HEMOGLOBIN: 13.4 g/dL (ref 12.0–15.0)
MCHC: 33.5 g/dL (ref 30.0–36.0)
MCV: 93.3 fl (ref 78.0–100.0)
PLATELETS: 161 10*3/uL (ref 150.0–400.0)
RBC: 4.29 Mil/uL (ref 3.87–5.11)
RDW: 14.7 % (ref 11.5–15.5)
WBC: 4.6 10*3/uL (ref 4.0–10.5)

## 2015-05-28 LAB — BASIC METABOLIC PANEL
BUN: 21 mg/dL (ref 6–23)
CHLORIDE: 105 meq/L (ref 96–112)
CO2: 29 meq/L (ref 19–32)
Calcium: 9.7 mg/dL (ref 8.4–10.5)
Creatinine, Ser: 1.32 mg/dL — ABNORMAL HIGH (ref 0.40–1.20)
GFR: 40.56 mL/min — ABNORMAL LOW (ref >60.00)
GLUCOSE: 90 mg/dL (ref 70–99)
POTASSIUM: 3.7 meq/L (ref 3.5–5.1)
SODIUM: 140 meq/L (ref 135–145)

## 2015-05-28 NOTE — Progress Notes (Signed)
     History of Present Illness:  Patient is an 81 year old female known to Dr. Ardis Hughs. She has a history of irritable bowel syndrome actually diagnosed in [place] prior to relocating to [place]. Patient also has a history of diverticulosis and GERD. Patient was seen here June 2015 for anemia and Hemoccult-positive stool. Given cardiac issues we arranged for we pursued non-invasive workup. Cologuard was negative. . Celiac studies were negative. Iron studies did indicate iron deficiency. Patient was started on ferrous sulfate but admits to stopping it several months ago.  Patient was hospitalized a few weeks ago with chest pain. Cardiac enzymes were negative. Cardiology felt no further workup was indicated. Following hospital discharge patient began having intermittent black stools. She takes a baby asa, no other NSAIDs. She did restart oral iron 4 days ago but black stools predated the iron. No bismuth products. Grandson does not think patient received any blood thinners for DVT prophylaxis while hospitalized.  Current Medications, Allergies, Past Medical History, Past Surgical History, Family History and Social History were reviewed in [employer] record.  Physical Exam: General: Pleasant, well developed , white female in no acute distress Head: Normocephalic and atraumatic Eyes:  sclerae anicteric, conjunctiva pink  Ears: Normal auditory acuity Lungs: Clear throughout to auscultation Heart: Regular rate and rhythm Abdomen: patient unable to get on the examination table secondary to history of orthopedic injuries/surgeries. Abdomen soft.  Rectal: small pieces of firm stool in vault, dark brown but heme negative Musculoskeletal: Symmetrical with no gross deformities  Extremities: No edema  Neurological: Alert oriented x 4, grossly nonfocal Psychological:  Alert and cooperative. Normal mood and affect  Assessment and Recommendations:   Pleasant 81 year old female  with intermittent black stool at home over last 3 weeks. Heme-negative in office today.  Patient had not taken iron in several months and hemoglobin 3 weeks ago was 13. Patient restarted iron 4 days ago.   CBC and BMET today  Hold oral iron for now. Hemoglobin normal despite not taking oral iron for several months. Furthermore, difficult for patient to assess for bleeding if stools black from iron.   We will call patient with CBC results and further recommendations. Upper endoscopy will depend on labs / clinical course.

## 2015-05-28 NOTE — Patient Instructions (Signed)
Please go to the basement level to have your labs drawn.  Stop the Iron supplement for now. Tye Savoy NP-C will call you.

## 2015-05-30 DIAGNOSIS — R195 Other fecal abnormalities: Secondary | ICD-10-CM | POA: Insufficient documentation

## 2015-05-31 NOTE — Progress Notes (Signed)
i agree with the above note, plan 

## 2015-06-02 ENCOUNTER — Ambulatory Visit (INDEPENDENT_AMBULATORY_CARE_PROVIDER_SITE_OTHER): Payer: Medicare Other | Admitting: Cardiology

## 2015-06-02 VITALS — BP 152/70 | HR 62 | Ht 66.0 in | Wt 160.3 lb

## 2015-06-02 DIAGNOSIS — I10 Essential (primary) hypertension: Secondary | ICD-10-CM

## 2015-06-02 DIAGNOSIS — R002 Palpitations: Secondary | ICD-10-CM | POA: Diagnosis not present

## 2015-06-02 DIAGNOSIS — Z9861 Coronary angioplasty status: Secondary | ICD-10-CM

## 2015-06-02 DIAGNOSIS — R0609 Other forms of dyspnea: Secondary | ICD-10-CM | POA: Diagnosis not present

## 2015-06-02 DIAGNOSIS — I480 Paroxysmal atrial fibrillation: Secondary | ICD-10-CM

## 2015-06-02 DIAGNOSIS — R079 Chest pain, unspecified: Secondary | ICD-10-CM | POA: Diagnosis not present

## 2015-06-02 DIAGNOSIS — E785 Hyperlipidemia, unspecified: Secondary | ICD-10-CM

## 2015-06-02 DIAGNOSIS — M7989 Other specified soft tissue disorders: Secondary | ICD-10-CM

## 2015-06-02 DIAGNOSIS — I2510 Atherosclerotic heart disease of native coronary artery without angina pectoris: Secondary | ICD-10-CM

## 2015-06-02 NOTE — Progress Notes (Signed)
PCP: Thressa Sheller, MD  Clinic Note: Chief Complaint  Patient presents with  . Hospitalization Follow-up    chest pain-had angina bad last night, shortness of breath-nothing more than nomal, has edema, no pain in legs, no cramping in legs, no lightheadedness, no dizziness  . Atrial Fibrillation  . Coronary Artery Disease    HPI: Jeanne Hart is a 81 y.o. female with a PMH below who presents today for followup of chronic persistent atrial fibrillation on amiodarone.she apparently previously been followed by Dr. Gwenlyn Found -- last seen in November 2015, but after I saw her in March 2016, she was requested transfer of care to me.    She has a history of PAF as well as CAD status post PCI (~3 before 12/2010 @ [facility], 4th in [place], [place]) along with other risk factors Such as hypertension and dyslipidemia. She has been maintained on amiodarone for her atrial fibrillation as far as Dr. Kennon Holter notes indicate she has not had any recurrent flares in a while. She is on atenolol for additional heart rate control.she also takes an additional dose of her atenolol if she has a sensation of more rapid heartbeat than usual.  [name] was last seen on 02/15/2015: -   Recent Hospitalizations: 8/2-12/2014 for CP -> Cards c/s: She described having a piercing pain last night. The pain was clearly different from her previous episodes of angina. It was not relieved with sublingual nitroglycerin. The pain eventually resolved by this morning.  Her troponins are negative. Her EKG is unremarkable.  Studies Reviewed: See Echo results below [place].  Interval History: She presents todayreally noticing that she has had some intermittent episodes of rapid heartbeat. Last night was particularly bad episode where it lasted about 2-3 hours and she described as being chest discomfort that she thought was angina. It is not exertional. It was at rest she is sitting there and noticed that her heart rate went up. She gets short  of breath with it. She did take an extra dose of atenolol and it improved.  She says that she had a rapid heart rate episodes every 2-3 times a month. She did not take an additional dose of amiodarone as another potential option. This AM,she feels fine - no fatigue, dizziness, excercise intolerance - suggestive of symptomatic bradycardia. Other than when she has the episodes of fast heart rates, she denies any anginal-type chest tightness or pressure of 80 with rest or exertion. She does have some mild venous insufficiency type of lower extremity edema and she is taking when necessary Lasix. Every now and then this does bother her more than other times and is concerned if there is any way to treat without significantly more medications. She has baseline exertional dyspnea if she overdoes it [name] exerts herself.  She denies any PND or orthopnea. No syncope near syncope, TIAs amaurosis fugax. No melena, hematochezia or hematuria. Her previous notes, she family declined anticoagulation for concern of falls and bleeding issues.  She may have just had labs checked by her PCP that I don't have.   Past Medical History  Diagnosis Date  . Insomnia     chronic  . IBS (irritable bowel syndrome)   . HTN (hypertension)   . Dyslipidemia   . Family history of cardiovascular disease   . PAF (paroxysmal atrial fibrillation)     coumadin was stopped secondary to GI Bleed, on amiodarone; monitor march 2014-NS[facility]  . CAD S/P percutaneous coronary angioplasty  PCI x 2; once at [facility] and once in [place]  . Hypothyroidism   . Dyspnea on exertion   . GERD (gastroesophageal reflux disease)   . Diverticulitis     Past Surgical History  Procedure Laterality Date  . Coronary angioplasty with stent placement  01/05/2011    2.5x38mm Promus DES stent to RCA post dilated to 2.75x19mm   . Coronary angioplasty with stent placement  ?    3 stents per patient  . Appendectomy    . Cholecystectomy    .  Abdominal hysterectomy    Echo 12/2014: Reviewed. Study Conclusions  - Left ventricle: The cavity size was normal. There was mild focal basal hypertrophy of the septum. Systolic function was normal. The estimated ejection fraction was in the range of 55% to 60%. Wall motion was normal; there were no regional wall motion abnormalities. - Aorta: Aortic root dimension: 39 mm (ED). - Mitral valve: Calcified annulus. There was mild regurgitation. - Left atrium: The atrium was moderately dilated. Anterior-posterior dimension: 43 mm. - Right ventricle: The cavity size was moderately dilated. Wall thickness was normal. - Right atrium: The atrium was moderately dilated. - Tricuspid valve: There was moderate regurgitation. - Pulmonary arteries: Systolic pressure was moderately increased.   ROS: A comprehensive was performed. Review of Systems  Constitutional: Positive for malaise/fatigue.  HENT: Negative for nosebleeds.   Respiratory: Negative for cough.   Cardiovascular: Positive for chest pain and palpitations. Negative for claudication.  Gastrointestinal: Negative for blood in stool and melena.  Genitourinary: Negative for hematuria.  Neurological: Negative for dizziness.  All other systems reviewed and are negative.   Prior to Admission medications   Medication Sig Start Date End Date Taking? Authorizing Provider  amiodarone (PACERONE) 200 MG tablet Take 1 tablet (200 mg total) by mouth daily. One tablet once daily and one tablet as needed for fast heart rate 05/11/15  Yes Shanker Kristeen Mans, MD  amLODipine (NORVASC) 5 MG tablet Take 1 tablet by mouth daily. take 1 tab dialy 05/07/15  Yes Historical Provider, MD  Ascorbic Acid (VITAMIN C PO) Take 1 tablet by mouth daily.    Yes Historical Provider, MD  aspirin EC 81 MG tablet Take 1 tablet (81 mg total) by mouth daily. 02/15/15  Yes Leonie Man, MD  atenolol (TENORMIN) 25 MG tablet Take 25 mg by mouth daily.  02/28/14  Yes  Historical Provider, MD  Cholecalciferol (VITAMIN D PO) Take 1 tablet by mouth daily.    Yes Historical Provider, MD  ferrous sulfate 325 (65 FE) MG tablet Take one po BID Patient taking differently: Take 325 mg by mouth. Take one po BID 04/06/14  Yes Jessica D Zehr, PA-C  FLUOCINOLONE ACETONIDE SCALP 0.01 % OIL Apply 1 application topically as needed. No specific days 12/21/14  Yes Historical Provider, MD  furosemide (LASIX) 20 MG tablet Take 1 tablet (20 mg total) by mouth daily as needed for edema (as needed for leg swelling.). 03/29/15  Yes Leonie Man, MD  isosorbide mononitrate (IMDUR) 30 MG 24 hr tablet Take 1 tablet (30 mg total) by mouth daily. 09/09/14  Yes Margarita Mail, PA-C  levothyroxine (SYNTHROID, LEVOTHROID) 100 MCG tablet Take 100 mcg by mouth daily before breakfast.   Yes Historical Provider, MD  nitroGLYCERIN (NITROSTAT) 0.4 MG SL tablet Place 1 tablet (0.4 mg total) under the tongue every 5 (five) minutes as needed for chest pain. 11/20/14  Yes Lorretta Harp, MD  pantoprazole (PROTONIX) 40 MG tablet Take  40 mg by mouth daily.    Yes Historical Provider, MD  Polyethyl Glycol-Propyl Glycol (SYSTANE) 0.4-0.3 % SOLN Apply 2 drops to eye at bedtime.   Yes Historical Provider, MD  pravastatin (PRAVACHOL) 20 MG tablet Take 1 tablet by mouth daily. Take 1 tab daily 05/30/15  Yes Historical Provider, MD  zolpidem (AMBIEN CR) 12.5 MG CR tablet Take 12.5 mg by mouth every other day.   Yes Historical Provider, MD  ketoconazole (NIZORAL) 2 % shampoo Use as directed 04/19/15   Historical Provider, MD   Allergies  Allergen Reactions  . Nexium [Esomeprazole] Other (See Comments)    Aggravates IBS.   . Statins Other (See Comments)    Had jaundice as child.  Does tolerate current statin therapy.    . Codeine Other (See Comments)    Post-surgical reaction of unknown type.  . Morphine And Related Other (See Comments)    Unknown.    Marland Kitchen Penicillins Other (See Comments)    Unknown.  . Sulfa  Antibiotics Other (See Comments)    Unknown childhood reaction.      Social History   Social History  . Marital Status: Widowed    Spouse Name: N/A  . Number of Children: 3  . Years of Education: N/A   Occupational History  . Sales/Office-retired    Social History Main Topics  . Smoking status: Never Smoker   . Smokeless tobacco: Never Used  . Alcohol Use: No  . Drug Use: No  . Sexual Activity: Not Asked   Other Topics Concern  . None   Social History Narrative    Family History  Problem Relation Age of Onset  . Diabetes Maternal Aunt   . Diabetes Son   . Heart disease      Both sides of family  . Colon cancer Neg Hx      Wt Readings from Last 3 Encounters:  06/02/15 160 lb 5 oz (72.717 kg)  05/28/15 159 lb (72.122 kg)  05/11/15 155 lb 3.3 oz (70.4 kg)    PHYSICAL EXAM BP 152/70 mmHg  Pulse 62  Ht 5\' 6"  (1.676 m)  Wt 160 lb 5 oz (72.717 kg)  BMI 25.89 kg/m2 General appearance: alert, cooperative, appears stated age, no distress - but seems concerned and otherwise healthy. Neck: no adenopathy, no carotid bruit and no JVD Lungs: Mostly CTAB, normal percussion bilaterally and non-labored Heart: regular rate and rhythm, S1&S2 normal, no murmur, + S4 gallop, no rubs. Nondisplaced PMI. Abdomen: soft, non-tender; bowel sounds normal; no masses, no organomegaly; no HJR Extremities: extremities normal, atraumatic, no cyanosis, and trace to 1+ edema  Pulses: 2+ and symmetric; Skin: normal and mobility and turgor normal  Neurologic: Mental status: Alert, oriented, thought content appropriate Cranial nerves: normal (II-XII grossly intact)    Adult ECG Report  Rate: 44 ;  Rhythm: Markings bradycardia, with 1AVB (PR 216).; otherwise normal axis, intervals and durations.  Narrative Interpretation: heart rate is lower than previous. Otherwise normal. (Of note the patient did take one extra dose of beta blocker last night)   Other studies Reviewed: Additional  studies/ records that were reviewed today include:  Recent Labs:   Not available for review.   ASSESSMENT / PLAN: Problem List Items Addressed This Visit    CAD S/P percutaneous coronary angioplasty (Chronic)    That her symptoms are persistent and. She does have some exertional dyspnea but is stable. She had relatively normal echo function. Low threshold to consider stress test if her  A. Fib is being more frequent. On aspirin only. Also on Imdur and beta blocker. On amlodipine that is for blood pressure but is also antianginal.  Back onstatin per PCP.      Relevant Medications   amLODipine (NORVASC) 5 MG tablet   pravastatin (PRAVACHOL) 20 MG tablet   Chest pain    A sharp episode of substernal chest pain made her go to the emergency room recently. This really does not sound anginal in nature. If her if episodes continue and she has a sensation of chest discomfort, low threshold to consider relook Myoview stress test.      Coronary artery disease   Relevant Medications   amLODipine (NORVASC) 5 MG tablet   pravastatin (PRAVACHOL) 20 MG tablet   Other Relevant Orders   EKG 12-Lead (Completed)   DOE (dyspnea on exertion)   Essential hypertension - Primary (Chronic)    Her blood pressure is little bit high today, but is likely because she is somewhat nervous. I will watch her pressures for now, but again would consider the possibility of ARB vs. Increasing CCB dose..      Relevant Medications   amLODipine (NORVASC) 5 MG tablet   pravastatin (PRAVACHOL) 20 MG tablet   Other Relevant Orders   EKG 12-Lead (Completed)   Foot swelling    Stable. Continue low-dose Lasix. Can adjust based on swelling with additional when necessary. Also recommend support hose / compressive stockings.      Hyperlipidemia with target LDL less than 70 (Chronic)    On Toprol statin. Monitored by PCP. Do not have recent labs that apparently were recently checked.      Relevant Medications    amLODipine (NORVASC) 5 MG tablet   pravastatin (PRAVACHOL) 20 MG tablet   Palpitations (Chronic)   Relevant Orders   EKG 12-Lead (Completed)   Paroxysmal atrial fibrillation (Chronic)    See me she is now having some more recurrent episodes of theA. Fib. On a bit concerned that the bradycardia today with her taking a full dose of atenolol for this episode. I recommended that she switch to half a dose atenolol when necessary. Issues having more episodes, I would suggest that a more appropriate course of action would probably be to increase amiodarone dose for a short period.  I think these episodes of what she is considered to be "angina". She is not having any exertional symptoms otherwise.  Not on any anticoagulation as discussed.      Relevant Medications   amLODipine (NORVASC) 5 MG tablet   pravastatin (PRAVACHOL) 20 MG tablet   Other Relevant Orders   EKG 12-Lead (Completed)      Current medicines are reviewed at length with the patient today. (+/- concerns) BB dosing The following changes have been made: reduce PRN BB dose to 1/2 tab. Studies Ordered:   Orders Placed This Encounter  Procedures  . EKG 12-Lead    May take an additional 1/2 tablet of Atenolol if needed for rapid heart rate. Continue all other medications   Use support hose ( you may buy over the counter) and elevated  Legs as much as possible   Your physician recommends that you schedule a follow-up appointment in jan 2017 cancel appointment for nov 2016.    Leonie Man, M.D., M.S. Interventional Cardiologist   Pager # [phone]   ASSESSMENT / PLAN: Problem List Items Addressed This Visit    CAD S/P percutaneous coronary angioplasty (Chronic)    No symptoms to suggest  angina. His exertional dyspnea. Echo looked pretty good overall LV wall motion. Continue to monitor for symptoms of possible angina equivalent. Low threshold to consider stress test. She is not all that excited about  the concept of detailed evaluation. She is on aspirin and beta blocker as well as Imdur. She takes amlodipine for blood pressure but also antianginal effect as well. She is now taking pravastatin would best be monitored by PCP.  Reduce aspirin from 325- to 81 mg      Relevant Medications   aspirin EC 81 MG tablet   DOE (dyspnea on exertion)    Not necessarily anginal in nature. She could still have diastolic dysfunction needing any exertional dyspnea especially she is under strain with carrying something. She is not on a diuretic. Which would be something to consider.especially if her swelling gets worse.      Essential hypertension (Chronic)    Well-controlled today. She is not on ACE inhibitor or ARB. As indicated in my last note. If the swelling becomes an issue we may want to convert from amlodipine to an ACE inhibitor or ARB. We also may want to potentially consider the addition of some type of diuretic abuse as a when necessary. Some of the swelling may be related to amlodipine and Imdur.      Relevant Medications   aspirin EC 81 MG tablet   Foot swelling    This also seems to have improved. I think she may do well with support hose. Because her age to put her on a diuretic unless it becomes bad. But is not having PND and orthopnea symptoms I don't think it's heart failure mediated. For future followup may want to consider a when necessary diuretic. Otherwise I recommend that she keep her feetelevated when possible.      Palpitations (Chronic)    No true arrhythmia on the monitor. It does not mean that she is not having them. But when she was noticing that, there was no arrhythmia noted. Hopefully these are stable and appear to be benign. Continue beta blocker and amiodarone      Paroxysmal atrial fibrillation - Primary (Chronic)    Maintaining sinus rhythm and sinus bradycardia per or event monitor on amiodarone. Continue current plan of  using when necessary atenolol for additional heart rate control if necessary. Otherwise she'll stay on once daily atenolol.  : Without any symptoms she is feeling well. She may very well and having some A. Fib. One thing to consider would be possibly bumping her amiodarone dose if she has more episodes. She has not been on anticoagulation. This was a decision made prior to my taking over her care. Deferred to PCP.      Relevant Medications   aspirin EC 81 MG tablet

## 2015-06-02 NOTE — Patient Instructions (Signed)
May take an additional 1/2 tablet of Atenolol if needed for rapid heart rate. Continue all other medications   Use support hose ( you may buy over the counter) and elevated  Legs as much as possible   Your physician recommends that you schedule a follow-up appointment in jan 2017 cancel appointment for nov 2016.

## 2015-06-04 ENCOUNTER — Encounter: Payer: Self-pay | Admitting: Cardiology

## 2015-06-04 NOTE — Assessment & Plan Note (Signed)
A sharp episode of substernal chest pain made her go to the emergency room recently. This really does not sound anginal in nature. If her if episodes continue and she has a sensation of chest discomfort, low threshold to consider relook Myoview stress test.

## 2015-06-04 NOTE — Assessment & Plan Note (Signed)
On Toprol statin. Monitored by PCP. Do not have recent labs that apparently were recently checked.

## 2015-06-04 NOTE — Assessment & Plan Note (Signed)
Stable. Continue low-dose Lasix. Can adjust based on swelling with additional when necessary. Also recommend support hose / compressive stockings.

## 2015-06-04 NOTE — Assessment & Plan Note (Signed)
See me she is now having some more recurrent episodes of theA. Fib. On a bit concerned that the bradycardia today with her taking a full dose of atenolol for this episode. I recommended that she switch to half a dose atenolol when necessary. Issues having more episodes, I would suggest that a more appropriate course of action would probably be to increase amiodarone dose for a short period.  I think these episodes of what she is considered to be "angina". She is not having any exertional symptoms otherwise.  Not on any anticoagulation as discussed.

## 2015-06-04 NOTE — Assessment & Plan Note (Signed)
That her symptoms are persistent and. She does have some exertional dyspnea but is stable. She had relatively normal echo function. Low threshold to consider stress test if her A. Fib is being more frequent. On aspirin only. Also on Imdur and beta blocker. On amlodipine that is for blood pressure but is also antianginal.  Back onstatin per PCP.

## 2015-06-04 NOTE — Assessment & Plan Note (Signed)
Her blood pressure is little bit high today, but is likely because she is somewhat nervous. I will watch her pressures for now, but again would consider the possibility of ARB vs. Increasing CCB dose.Marland Kitchen

## 2015-06-15 DIAGNOSIS — G4700 Insomnia, unspecified: Secondary | ICD-10-CM | POA: Diagnosis not present

## 2015-06-30 ENCOUNTER — Ambulatory Visit: Payer: Medicare Other | Admitting: Gastroenterology

## 2015-08-16 DIAGNOSIS — D709 Neutropenia, unspecified: Secondary | ICD-10-CM | POA: Diagnosis not present

## 2015-08-16 DIAGNOSIS — I4891 Unspecified atrial fibrillation: Secondary | ICD-10-CM | POA: Diagnosis not present

## 2015-08-16 DIAGNOSIS — I129 Hypertensive chronic kidney disease with stage 1 through stage 4 chronic kidney disease, or unspecified chronic kidney disease: Secondary | ICD-10-CM | POA: Diagnosis not present

## 2015-08-16 DIAGNOSIS — E785 Hyperlipidemia, unspecified: Secondary | ICD-10-CM | POA: Diagnosis not present

## 2015-08-18 ENCOUNTER — Other Ambulatory Visit (HOSPITAL_COMMUNITY): Payer: Self-pay | Admitting: Internal Medicine

## 2015-08-18 DIAGNOSIS — R1310 Dysphagia, unspecified: Secondary | ICD-10-CM

## 2015-08-20 ENCOUNTER — Other Ambulatory Visit: Payer: Self-pay | Admitting: Internal Medicine

## 2015-08-24 ENCOUNTER — Other Ambulatory Visit: Payer: Self-pay | Admitting: Internal Medicine

## 2015-08-24 DIAGNOSIS — R1313 Dysphagia, pharyngeal phase: Secondary | ICD-10-CM

## 2015-08-27 ENCOUNTER — Other Ambulatory Visit (INDEPENDENT_AMBULATORY_CARE_PROVIDER_SITE_OTHER): Payer: Medicare Other

## 2015-08-27 ENCOUNTER — Other Ambulatory Visit: Payer: Self-pay | Admitting: Physician Assistant

## 2015-08-27 ENCOUNTER — Encounter: Payer: Self-pay | Admitting: Gastroenterology

## 2015-08-27 ENCOUNTER — Encounter: Payer: Self-pay | Admitting: Physician Assistant

## 2015-08-27 ENCOUNTER — Ambulatory Visit
Admission: RE | Admit: 2015-08-27 | Discharge: 2015-08-27 | Disposition: A | Discharge status: Home / Self Care | Payer: Medicare Other | Source: Ambulatory Visit | Attending: Internal Medicine | Admitting: Internal Medicine

## 2015-08-27 ENCOUNTER — Other Ambulatory Visit (HOSPITAL_COMMUNITY): Payer: Medicare Other

## 2015-08-27 ENCOUNTER — Ambulatory Visit (HOSPITAL_COMMUNITY): Payer: Medicare Other

## 2015-08-27 ENCOUNTER — Ambulatory Visit (INDEPENDENT_AMBULATORY_CARE_PROVIDER_SITE_OTHER): Payer: Medicare Other | Admitting: Physician Assistant

## 2015-08-27 ENCOUNTER — Ambulatory Visit (INDEPENDENT_AMBULATORY_CARE_PROVIDER_SITE_OTHER): Payer: Medicare Other | Admitting: Gastroenterology

## 2015-08-27 VITALS — BP 120/70 | HR 51 | Ht 64.0 in | Wt 157.7 lb

## 2015-08-27 VITALS — BP 110/70 | HR 72 | Ht 64.25 in | Wt 157.0 lb

## 2015-08-27 DIAGNOSIS — I10 Essential (primary) hypertension: Secondary | ICD-10-CM

## 2015-08-27 DIAGNOSIS — I2510 Atherosclerotic heart disease of native coronary artery without angina pectoris: Secondary | ICD-10-CM | POA: Diagnosis not present

## 2015-08-27 DIAGNOSIS — K589 Irritable bowel syndrome without diarrhea: Secondary | ICD-10-CM | POA: Diagnosis not present

## 2015-08-27 DIAGNOSIS — R1310 Dysphagia, unspecified: Secondary | ICD-10-CM | POA: Diagnosis not present

## 2015-08-27 DIAGNOSIS — R079 Chest pain, unspecified: Secondary | ICD-10-CM

## 2015-08-27 DIAGNOSIS — R1313 Dysphagia, pharyngeal phase: Secondary | ICD-10-CM

## 2015-08-27 DIAGNOSIS — K580 Irritable bowel syndrome with diarrhea: Secondary | ICD-10-CM | POA: Diagnosis not present

## 2015-08-27 DIAGNOSIS — Z9861 Coronary angioplasty status: Secondary | ICD-10-CM

## 2015-08-27 DIAGNOSIS — R002 Palpitations: Secondary | ICD-10-CM | POA: Diagnosis not present

## 2015-08-27 DIAGNOSIS — I480 Paroxysmal atrial fibrillation: Secondary | ICD-10-CM | POA: Diagnosis not present

## 2015-08-27 LAB — COMPREHENSIVE METABOLIC PANEL
ALT: 29 U/L (ref 0–35)
AST: 35 U/L (ref 0–37)
Albumin: 3.9 g/dL (ref 3.5–5.2)
Alkaline Phosphatase: 106 U/L (ref 39–117)
BILIRUBIN TOTAL: 1 mg/dL (ref 0.2–1.2)
BUN: 19 mg/dL (ref 6–23)
CALCIUM: 9.8 mg/dL (ref 8.4–10.5)
CO2: 28 mEq/L (ref 19–32)
CREATININE: 1.43 mg/dL — AB (ref 0.40–1.20)
Chloride: 103 mEq/L (ref 96–112)
GFR: 36.96 mL/min — ABNORMAL LOW (ref >60.00)
GLUCOSE: 92 mg/dL (ref 70–99)
Potassium: 4.7 mEq/L (ref 3.5–5.1)
Sodium: 140 mEq/L (ref 135–145)
Total Protein: 7.3 g/dL (ref 6.0–8.3)

## 2015-08-27 LAB — CBC WITH DIFFERENTIAL/PLATELET
BASOS ABS: 0 10*3/uL (ref 0.0–0.1)
Basophils Relative: 0.3 % (ref 0.0–3.0)
Eosinophils Absolute: 0 10*3/uL (ref 0.0–0.7)
Eosinophils Relative: 0.6 % (ref 0.0–5.0)
HCT: 41.5 % (ref 36.0–46.0)
Hemoglobin: 13.7 g/dL (ref 12.0–15.0)
LYMPHS ABS: 1.4 10*3/uL (ref 0.7–4.0)
Lymphocytes Relative: 16.4 % (ref 12.0–46.0)
MCHC: 33 g/dL (ref 30.0–36.0)
MCV: 92 fl (ref 78.0–100.0)
Monocytes Absolute: 0.5 10*3/uL (ref 0.1–1.0)
Monocytes Relative: 6.3 % (ref 3.0–12.0)
NEUTROS PCT: 76.4 % (ref 43.0–77.0)
Neutro Abs: 6.4 10*3/uL (ref 1.4–7.7)
Platelets: 156 10*3/uL (ref 150.0–400.0)
RBC: 4.51 Mil/uL (ref 3.87–5.11)
RDW: 14.6 % (ref 11.5–15.5)
WBC: 8.3 10*3/uL (ref 4.0–10.5)

## 2015-08-27 LAB — SEDIMENTATION RATE: Sed Rate: 20 mm/hr (ref 0–22)

## 2015-08-27 LAB — IGA: IGA: 418 mg/dL — AB (ref 68–378)

## 2015-08-27 MED ORDER — AMIODARONE HCL 200 MG PO TABS: 200.0000 mg | ORAL_TABLET | Freq: Every day | ORAL | Status: DC

## 2015-08-27 NOTE — Patient Instructions (Signed)
Please start taking citrucel (orange flavored) powder fiber supplement.  This may cause some bloating at first but that usually goes away. Begin with a small spoonful and work your way up to a large, heaping spoonful daily over a week. You will have labs checked today in the basement lab.  Please head down after you check out with the front desk  (cbc, IgA tTG, cmet, esr, stool for ova/parasites, stool for routine culture, stool for C. Diff toxin).

## 2015-08-27 NOTE — Telephone Encounter (Signed)
Rx(s) sent to pharmacy electronically.  

## 2015-08-27 NOTE — Progress Notes (Signed)
Patient ID: Jeanne Hart, female   DOB: [birth year], 81 y.o.   MRN: [id number]    Date:  08/27/2015   ID:  Jeanne Hart, DOB [birth year], MRN [id number]  PCP:  Thressa Sheller, MD  Primary Cardiologist:   Ellyn Hack  Chief Complaint  Patient presents with  . office visit    pt c/o chest pain, diagnosed in ED as acid reflux.   . Shortness of Breath    on exertion  . Edema    bilateral ankles     History of Present Illness: Jeanne Hart is a 81 y.o. female with a history of hypertension, paroxysmal atrial fibrillation-Coumadin was stopped secondary to GI bleed, coronary artery disease status post percutaneous coronary angioplasty/stenting 2, hypothyroidism, GERD.  Her last echocardiogram was March 2016 revealed ejection fraction of 55-60% with normal wall motion. Mild MR, left atrium moderately dilated, right ventricle moderately dilated, moderate dilation of the right atrium, moderate tricuspid regurgitation. Moderately increased pulmonary artery pressure.  She had a nonischemic stress test in October 2014.  Patient presents today for follow-up of rapid heart rate. She's been having for the last several weeks. Seems to be associated with some chest pressure, shortness of breath and dizziness. She been taking extra dose of amiodarone as needed. She's also been taking some nitroglycerin which does not appear to be doing anything. She otherwise denies nausea, vomiting, fever, orthopnea, PND, cough, congestion, abdominal pain, hematochezia, melena, claudication.  She gets some lower extremity edema which usually resolves in the morning. She does sleep on 3 pillows was instructed to do so by her previous cardiologist for her heart. Sounds like it may have been because of acid reflux.  Wt Readings from Last 3 Encounters:  08/27/15 157 lb 11.2 oz (71.532 kg)  08/27/15 157 lb (71.215 kg)  06/02/15 160 lb 5 oz (72.717 kg)     Past Medical History  Diagnosis Date  . Insomnia     chronic  . IBS  (irritable bowel syndrome)   . HTN (hypertension)   . Dyslipidemia   . Family history of cardiovascular disease   . PAF (paroxysmal atrial fibrillation) (HCC)     coumadin was stopped secondary to GI Bleed, on amiodarone; monitor march 2014-NSR  . CAD S/P percutaneous coronary angioplasty     PCI x 2; once at [facility] and once in [place]  . Hypothyroidism   . Dyspnea on exertion   . GERD (gastroesophageal reflux disease)   . Diverticulitis     Current Outpatient Prescriptions  Medication Sig Dispense Refill  . amiodarone (PACERONE) 200 MG tablet Take 1 tablet (200 mg total) by mouth daily. One tablet once daily and one tablet as needed for fast heart rate 60 tablet 6  . amLODipine (NORVASC) 5 MG tablet Take 1 tablet by mouth daily. take 1 tab dialy  2  . Ascorbic Acid (VITAMIN C PO) Take 1 tablet by mouth daily.     Marland Kitchen aspirin EC 81 MG tablet Take 1 tablet (81 mg total) by mouth daily. 90 tablet 3  . atenolol (TENORMIN) 25 MG tablet Take 25 mg by mouth daily.     . Biotin 10 MG CAPS Take by mouth.    . Cholecalciferol (VITAMIN D PO) Take 1 tablet by mouth daily.     . ferrous sulfate 325 (65 FE) MG tablet Take one po BID (Patient taking differently: Take 325 mg by mouth. Take one po BID) 60 tablet 3  . FLUOCINOLONE ACETONIDE SCALP 0.01 %  OIL Apply 1 application topically as needed. No specific days  2  . furosemide (LASIX) 20 MG tablet Take 1 tablet (20 mg total) by mouth daily as needed for edema (as needed for leg swelling.). 90 tablet 3  . isosorbide mononitrate (IMDUR) 30 MG 24 hr tablet Take 1 tablet (30 mg total) by mouth daily. 30 tablet 0  . ketoconazole (NIZORAL) 2 % shampoo Use as directed  2  . levothyroxine (SYNTHROID, LEVOTHROID) 100 MCG tablet Take 100 mcg by mouth daily before breakfast.    . nitroGLYCERIN (NITROSTAT) 0.4 MG SL tablet Place 1 tablet (0.4 mg total) under the tongue every 5 (five) minutes as needed for chest pain. 25 tablet 1  . pantoprazole (PROTONIX)  40 MG tablet Take 40 mg by mouth daily.     Vladimir Faster Glycol-Propyl Glycol (SYSTANE) 0.4-0.3 % SOLN Apply 2 drops to eye at bedtime.    . pravastatin (PRAVACHOL) 20 MG tablet Take 1 tablet by mouth daily. Take 1 tab daily  5  . zolpidem (AMBIEN CR) 12.5 MG CR tablet Take 12.5 mg by mouth every other day.     No current facility-administered medications for this visit.    Allergies:    Allergies  Allergen Reactions  . Nexium [Esomeprazole] Other (See Comments)    Aggravates IBS.   . Statins Other (See Comments)    Had jaundice as child.  Does tolerate current statin therapy.    . Codeine Other (See Comments)    Post-surgical reaction of unknown type.  . Morphine And Related Other (See Comments)    Unknown.    Marland Kitchen Penicillins Other (See Comments)    Unknown.  . Sulfa Antibiotics Other (See Comments)    Unknown childhood reaction.      Social History:  The patient  reports that she has never smoked. She has never used smokeless tobacco. She reports that she does not drink alcohol or use illicit drugs.   Family history:   Family History  Problem Relation Age of Onset  . Diabetes Maternal Aunt   . Diabetes Son   . Heart disease      Both sides of family  . Colon cancer Neg Hx     ROS:  Please see the history of present illness.  All other systems reviewed and negative.   PHYSICAL EXAM: VS:  BP 120/70 mmHg  Pulse 51  Ht 5\' 4"  (1.626 m)  Wt 157 lb 11.2 oz (71.532 kg)  BMI 27.06 kg/m2 Well nourished, well developed, in no acute distress HEENT: Pupils are equal round react to light accommodation extraocular movements are intact.  Neck: no JVDNo cervical lymphadenopathy. Cardiac: Regular rate and rhythm without murmurs rubs or gallops. Lungs:  clear to auscultation bilaterally, no wheezing, rhonchi or rales Abd: soft, nontender, positive bowel sounds all quadrants, no hepatosplenomegaly Ext: no lower extremity edema.  2+ radial and dorsalis pedis pulses. Skin: warm and  dry Neuro:  Grossly normal  EKG:  Sinus bradycardia rate 51 bpm QTC 476  ASSESSMENT AND PLAN:  Problem List Items Addressed This Visit    Paroxysmal atrial fibrillation (HCC) (Chronic)   Relevant Medications   amiodarone (PACERONE) 200 MG tablet   Palpitations (Chronic)   RESOLVED: PAF (paroxysmal atrial fibrillation) (HCC) - Primary   Relevant Medications   amiodarone (PACERONE) 200 MG tablet   Other Relevant Orders   EKG 12-Lead   Essential hypertension (Chronic)   Relevant Medications   amiodarone (PACERONE) 200 MG tablet   Chest  pain   CAD S/P percutaneous coronary angioplasty (Chronic)   Relevant Medications   amiodarone (PACERONE) 200 MG tablet     Paroxysmal atrial fibrillation: Patient currently in sinus bradycardia rate 51 bpm. Jeanne Hart increase her amiodarone to 400 mg daily for a week and have her resume 200 daily 200 when necessary daily thereafter. She will try and keep track of the number of episodes and see if the frequency has decreased. Given her baseline bradycardia, chronic change her beta blocker. I did offer a heart monitor for a week so we can see exactly how fast she is getting. She declined at this time.  Chest pain Results related to her rapid heart rate however it is unresponsive to nitroglycerin. She's not getting any chest pain during normal activities. We'll continue Imdur beta blocker aspirin.  She had a negative nuclear stress test 2 years ago.  Essential hypertension: Blood pressure well controlled no changes to current hypertensive medications.  Follow-up as scheduled in January with Dr. Ellyn Hack

## 2015-08-27 NOTE — Patient Instructions (Signed)
Your physician recommends that you schedule a follow-up appointment in: Keep appt with Dr Ellyn Hack on January 5th @10 :00 am   Your physician has recommended you make the following change in your medication: Increase Amiodarone 400 mg daily for 1 week, then Decrease 200 mg daily and take an extra 200 mg as need

## 2015-08-27 NOTE — Progress Notes (Signed)
HPI: This is a  very pleasant 81 year old who is here with her grandson.  I last saw her about 2 years ago. Since then she has been in twice for anemia issues. Most recently her blood counts are completely normal and she tells me she has not been on iron for several months.  Chief complaint is chronic alternating constipation, diarrhea   EGD 01/2011 (mild esophagitis) in [place].  [place] This showed diverticulosis, random biopsies were normal. No polyps or cancers.  She has had issues with constipation, alternating with loose stools.  Non bloody.    Overall, usually will be pretty loose.  Can be as many as 10 times in a day.   Most days out of the week she will have multiple loose stools.  These bowel troubles have been ongoing for many years. Most concerning in past 2 years.    Past Medical History  Diagnosis Date  . Insomnia     chronic  . IBS (irritable bowel syndrome)   . HTN (hypertension)   . Dyslipidemia   . Family history of cardiovascular disease   . PAF (paroxysmal atrial fibrillation) (HCC)     coumadin was stopped secondary to GI Bleed, on amiodarone; monitor march 2014-NSR  . CAD S/P percutaneous coronary angioplasty     PCI x 2; once at [facility] and once in [place]  . Hypothyroidism   . Dyspnea on exertion   . GERD (gastroesophageal reflux disease)   . Diverticulitis     Past Surgical History  Procedure Laterality Date  . Coronary angioplasty with stent placement  01/05/2011    2.5x71mm Promus DES stent to RCA post dilated to 2.75x35mm   . Coronary angioplasty with stent placement  ?    3 stents per patient  . Appendectomy    . Cholecystectomy    . Abdominal hysterectomy      Current Outpatient Prescriptions  Medication Sig Dispense Refill  . amiodarone (PACERONE) 200 MG tablet Take 1 tablet (200 mg total) by mouth daily. One tablet once daily and one tablet as needed for fast heart rate 45 tablet 6  . amLODipine (NORVASC) 5 MG  tablet Take 1 tablet by mouth daily. take 1 tab dialy  2  . Ascorbic Acid (VITAMIN C PO) Take 1 tablet by mouth daily.     Marland Kitchen aspirin EC 81 MG tablet Take 1 tablet (81 mg total) by mouth daily. 90 tablet 3  . atenolol (TENORMIN) 25 MG tablet Take 25 mg by mouth daily.     . Biotin 10 MG CAPS Take by mouth.    . Cholecalciferol (VITAMIN D PO) Take 1 tablet by mouth daily.     . ferrous sulfate 325 (65 FE) MG tablet Take one po BID (Patient taking differently: Take 325 mg by mouth. Take one po BID) 60 tablet 3  . FLUOCINOLONE ACETONIDE SCALP 0.01 % OIL Apply 1 application topically as needed. No specific days  2  . furosemide (LASIX) 20 MG tablet Take 1 tablet (20 mg total) by mouth daily as needed for edema (as needed for leg swelling.). 90 tablet 3  . isosorbide mononitrate (IMDUR) 30 MG 24 hr tablet Take 1 tablet (30 mg total) by mouth daily. 30 tablet 0  . ketoconazole (NIZORAL) 2 % shampoo Use as directed  2  . levothyroxine (SYNTHROID, LEVOTHROID) 100 MCG tablet Take 100 mcg by mouth daily before breakfast.    . nitroGLYCERIN (NITROSTAT) 0.4 MG SL tablet Place 1 tablet (  0.4 mg total) under the tongue every 5 (five) minutes as needed for chest pain. 25 tablet 1  . pantoprazole (PROTONIX) 40 MG tablet Take 40 mg by mouth daily.     Vladimir Faster Glycol-Propyl Glycol (SYSTANE) 0.4-0.3 % SOLN Apply 2 drops to eye at bedtime.    . pravastatin (PRAVACHOL) 20 MG tablet Take 1 tablet by mouth daily. Take 1 tab daily  5  . zolpidem (AMBIEN CR) 12.5 MG CR tablet Take 12.5 mg by mouth every other day.     No current facility-administered medications for this visit.    Allergies as of 08/27/2015 - Review Complete 08/27/2015  Allergen Reaction Noted  . Nexium [esomeprazole] Other (See Comments) 04/29/2013  . Statins Other (See Comments) 04/29/2013  . Codeine Other (See Comments) 03/23/2014  . Morphine and related Other (See Comments) 09/17/2013  . Penicillins Other (See Comments) 04/29/2013  . Sulfa  antibiotics Other (See Comments) 04/29/2013    Family History  Problem Relation Age of Onset  . Diabetes Maternal Aunt   . Diabetes Son   . Heart disease      Both sides of family  . Colon cancer Neg Hx     Social History   Social History  . Marital Status: Widowed    Spouse Name: N/A  . Number of Children: 3  . Years of Education: N/A   Occupational History  . Sales/Office-retired    Social History Main Topics  . Smoking status: Never Smoker   . Smokeless tobacco: Never Used  . Alcohol Use: No  . Drug Use: No  . Sexual Activity: Not on file   Other Topics Concern  . Not on file   Social History Narrative     Physical Exam: BP 110/70 mmHg  Pulse 72  Ht 5' 4.25" (1.632 m)  Wt 157 lb (71.215 kg)  BMI 26.74 kg/m2  SpO2 98% Constitutional: generally well-appearing Psychiatric: alert and oriented x3 Abdomen: soft, nontender, nondistended, no obvious ascites, no peritoneal signs, normal bowel sounds   Assessment and plan: 81 y.o. female with chronic alternating constipation diarrhea  She is going to start fiber supplements with Citrucel once daily. She will also have basic set of labs and stool testing, see those summarized below. She is mainly diarrhea predominant and so starting fiber supplements may backfire however I think it is still worth a try.   Owens Loffler, MD [facility] 08/27/2015, 9:56 AM

## 2015-08-30 ENCOUNTER — Ambulatory Visit (HOSPITAL_COMMUNITY): Payer: Medicare Other

## 2015-08-30 LAB — TISSUE TRANSGLUTAMINASE, IGA: Tissue Transglutaminase Ab, IgA: 3 U/mL (ref <4)

## 2015-10-14 ENCOUNTER — Ambulatory Visit: Payer: Medicare Other | Admitting: Cardiology

## 2015-10-15 DIAGNOSIS — M25561 Pain in right knee: Secondary | ICD-10-CM | POA: Diagnosis not present

## 2015-11-30 ENCOUNTER — Ambulatory Visit: Payer: Medicare Other | Admitting: Cardiology

## 2015-12-02 ENCOUNTER — Ambulatory Visit: Payer: Medicare Other | Admitting: Cardiology

## 2016-01-08 HISTORY — PX: NM MYOVIEW LTD: HXRAD82

## 2016-01-11 ENCOUNTER — Encounter: Payer: Self-pay | Admitting: Physician Assistant

## 2016-01-11 ENCOUNTER — Other Ambulatory Visit: Payer: Self-pay | Admitting: Physician Assistant

## 2016-01-11 ENCOUNTER — Ambulatory Visit (INDEPENDENT_AMBULATORY_CARE_PROVIDER_SITE_OTHER): Payer: Medicare Other | Admitting: Physician Assistant

## 2016-01-11 VITALS — BP 128/66 | HR 60 | Ht 64.0 in | Wt 154.0 lb

## 2016-01-11 DIAGNOSIS — I209 Angina pectoris, unspecified: Secondary | ICD-10-CM

## 2016-01-11 DIAGNOSIS — I2510 Atherosclerotic heart disease of native coronary artery without angina pectoris: Secondary | ICD-10-CM | POA: Diagnosis not present

## 2016-01-11 DIAGNOSIS — I10 Essential (primary) hypertension: Secondary | ICD-10-CM | POA: Diagnosis not present

## 2016-01-11 DIAGNOSIS — I480 Paroxysmal atrial fibrillation: Secondary | ICD-10-CM | POA: Diagnosis not present

## 2016-01-11 DIAGNOSIS — I4891 Unspecified atrial fibrillation: Secondary | ICD-10-CM | POA: Diagnosis not present

## 2016-01-11 DIAGNOSIS — Z9861 Coronary angioplasty status: Secondary | ICD-10-CM

## 2016-01-11 DIAGNOSIS — R079 Chest pain, unspecified: Secondary | ICD-10-CM

## 2016-01-11 LAB — TSH: TSH: 1.3 m[IU]/L

## 2016-01-11 MED ORDER — NITROGLYCERIN 0.4 MG SL SUBL: 0.4000 mg | SUBLINGUAL_TABLET | SUBLINGUAL | Status: DC | PRN

## 2016-01-11 NOTE — Patient Instructions (Signed)
Medication Instructions:  Your physician recommends that you continue on your current medications as directed. Please refer to the Current Medication list given to you today.  An Rx for Nitro-glycerin has been sent to your pharmacy  Labwork: Tsh today  Testing/Procedures: Your physician has recommended that you wear an event monitor. Event monitors are medical devices that record the heart's electrical activity. Doctors most often [place] these monitors to diagnose arrhythmias. Arrhythmias are problems with the speed or rhythm of the heartbeat. The monitor is a small, portable device. You can wear one while you do your normal daily activities. This is usually used to diagnose what is causing palpitations/syncope (passing out).  Your physician has requested that you have a lexiscan myoview. For further information please visit [email or web address]. Please follow instruction sheet, as given.    Follow-Up: Your physician recommends that you schedule a follow-up appointment in: May 2017 with Dr.Harding   Any Other Special Instructions Will Be Listed Below (If Applicable).     If you need a refill on your cardiac medications before your next appointment, please call your pharmacy.

## 2016-01-11 NOTE — Progress Notes (Signed)
Patient ID: Jeanne Hart, female   DOB: 1927/04/27, 82 y.o.   MRN: [id number]    Date:  01/11/2016   ID:  Jeanne Hart, DOB 01-07-1927, MRN [id number]  PCP:  Thressa Sheller, MD  Primary Cardiologist:  Ellyn Hack  Chief Complaint  Patient presents with  . Follow-up    fast heart rate, swelling, nausea lately, occ shortness of breath     History of Present Illness: Jeanne Hart is a 82 y.o. female with a history of hypertension, paroxysmal atrial fibrillation-Coumadin was stopped secondary to GI bleed, coronary artery disease status post percutaneous coronary angioplasty/stenting 2, hypothyroidism, GERD. Her last echocardiogram was March 2016 revealed ejection fraction of 55-60% with normal wall motion. Mild MR, left atrium moderately dilated, right ventricle moderately dilated, moderate dilation of the right atrium, moderate tricuspid regurgitation. Moderately increased pulmonary artery pressure. She had a nonischemic stress test in October 2014.  I saw her November 2016 for follow-up of rapid heart rate.  at that time I decreased her amiodarone back to 200 mg daily.   she presents for follow-up and evaluation of rapid heart rate and chest pain. She says in the last 3 weeks she's been taking up to 3 amiodarone tablets per day. She also reports increasing chest pain. She said she had 4-5 chest pain when she walked in the building. It's been associated with some shortness of breath. No vomiting seems to have chronic nausea.  She also gets worsening lower extremity edema as the day progresses. She has compression socks at home with her difficult to put on. Recommended getting a lower strength sock.   The patient currently denies fever, orthopnea, dizziness, PND, cough, congestion, abdominal pain, hematochezia, melena, claudication.  Wt Readings from Last 3 Encounters:  01/11/16 154 lb (69.854 kg)  08/27/15 157 lb 11.2 oz (71.532 kg)  08/27/15 157 lb (71.215 kg)     Past Medical History    Diagnosis Date  . Insomnia     chronic  . IBS (irritable bowel syndrome)   . HTN (hypertension)   . Dyslipidemia   . Family history of cardiovascular disease   . PAF (paroxysmal atrial fibrillation) (HCC)     coumadin was stopped secondary to GI Bleed, on amiodarone; monitor march 2014-NSR  . CAD S/P percutaneous coronary angioplasty     PCI x 2; once at [facility] and once in [place]  . Hypothyroidism   . Dyspnea on exertion   . GERD (gastroesophageal reflux disease)   . Diverticulitis     Current Outpatient Prescriptions  Medication Sig Dispense Refill  . amiodarone (PACERONE) 200 MG tablet TAKE 1 TABLET BY MOUTH ONCE DAILY AND 1 TABLET AS NEEDED FOR FAST HEART RATE 180 tablet 4  . amLODipine (NORVASC) 5 MG tablet Take 1 tablet by mouth daily. take 1 tab dialy  2  . Ascorbic Acid (VITAMIN C PO) Take 1 tablet by mouth daily.     Marland Kitchen aspirin EC 81 MG tablet Take 1 tablet (81 mg total) by mouth daily. 90 tablet 3  . atenolol (TENORMIN) 25 MG tablet Take 25 mg by mouth daily.     . Biotin 10 MG CAPS Take by mouth.    . Cholecalciferol (VITAMIN D PO) Take 1 tablet by mouth daily.     . ferrous sulfate 325 (65 FE) MG tablet Take one po BID (Patient taking differently: Take 325 mg by mouth. Take one po BID) 60 tablet 3  . FLUOCINOLONE ACETONIDE SCALP 0.01 % OIL Apply  1 application topically as needed. No specific days  2  . furosemide (LASIX) 20 MG tablet Take 1 tablet (20 mg total) by mouth daily as needed for edema (as needed for leg swelling.). 90 tablet 3  . isosorbide mononitrate (IMDUR) 30 MG 24 hr tablet Take 1 tablet (30 mg total) by mouth daily. 30 tablet 0  . ketoconazole (NIZORAL) 2 % shampoo Use as directed  2  . levothyroxine (SYNTHROID, LEVOTHROID) 100 MCG tablet Take 100 mcg by mouth daily before breakfast.    . nitroGLYCERIN (NITROSTAT) 0.4 MG SL tablet Place 1 tablet (0.4 mg total) under the tongue every 5 (five) minutes as needed for chest pain. 25 tablet 3  .  pantoprazole (PROTONIX) 40 MG tablet Take 40 mg by mouth daily.     Vladimir Faster Glycol-Propyl Glycol (SYSTANE) 0.4-0.3 % SOLN Apply 2 drops to eye at bedtime.    . pravastatin (PRAVACHOL) 20 MG tablet Take 1 tablet by mouth daily. Take 1 tab daily  5  . zolpidem (AMBIEN CR) 12.5 MG CR tablet Take 12.5 mg by mouth every other day.     No current facility-administered medications for this visit.    Allergies:    Allergies  Allergen Reactions  . Nexium [Esomeprazole] Other (See Comments)    Aggravates IBS.   . Statins Other (See Comments)    Had jaundice as child.  Does tolerate current statin therapy.    . Codeine Other (See Comments)    Post-surgical reaction of unknown type.  . Morphine And Related Other (See Comments)    Unknown.    Marland Kitchen Penicillins Other (See Comments)    Unknown.  . Sulfa Antibiotics Other (See Comments)    Unknown childhood reaction.      Social History:  The patient  reports that she has never smoked. She has never used smokeless tobacco. She reports that she does not drink alcohol or use illicit drugs.   Family history:   Family History  Problem Relation Age of Onset  . Diabetes Maternal Aunt   . Diabetes Son   . Heart disease      Both sides of family  . Colon cancer Neg Hx     ROS:  Please see the history of present illness.  All other systems reviewed and negative.   PHYSICAL EXAM: VS:  BP 128/66 mmHg  Pulse 60  Ht 5\' 4"  (1.626 m)  Wt 154 lb (69.854 kg)  BMI 26.42 kg/m2 Well nourished, well developed, in no acute distress HEENT: Pupils are equal round react to light accommodation extraocular movements are intact.  Neck: no JVDNo cervical lymphadenopathy. Cardiac: Regular rate and rhythm without murmurs rubs or gallops. Lungs:  clear to auscultation bilaterally, no wheezing, rhonchi or rales Abd: soft, nontender, positive bowel sounds all quadrants, no hepatosplenomegaly Ext: no lower extremity edema.  2+ radial and dorsalis pedis  pulses. Skin: warm and dry Neuro:  Grossly normal    ASSESSMENT AND PLAN:  Problem List Items Addressed This Visit    Paroxysmal atrial fibrillation (HCC) - Primary (Chronic)   Relevant Medications   nitroGLYCERIN (NITROSTAT) 0.4 MG SL tablet   Other Relevant Orders   Cardiac event monitor   TSH   Essential hypertension (Chronic)   Relevant Medications   nitroGLYCERIN (NITROSTAT) 0.4 MG SL tablet   Other Relevant Orders   TSH   Chest pain   Relevant Orders   Myocardial Perfusion Imaging   CAD S/P percutaneous coronary angioplasty (Chronic)   Relevant  Medications   nitroGLYCERIN (NITROSTAT) 0.4 MG SL tablet   Other Relevant Orders   Myocardial Perfusion Imaging       currently Mrs.Dettinger is in sinus rhythm with a rate of 60 bpm on exam.  will continue amiodarone at current dose allowing for as needed extra to 2. I ordered a CardioNet monitor to see what her actual atrial fib burden is.  Check TSH.   She also reports having chest pain more frequently and when she just walked into the building today. It seems to be exertional. We'll check a Lexiscan Cardiolite. We'll also refill her nitroglycerin which she said she had spilled out the remainder of.  Recommended she get some compression socks which are not as strong. Schedule follow-up for one month.  Blood Pressure's controlled.

## 2016-01-12 NOTE — Telephone Encounter (Signed)
Rx(s) sent to pharmacy electronically.  

## 2016-01-13 ENCOUNTER — Telehealth: Payer: Self-pay | Admitting: *Deleted

## 2016-01-13 NOTE — Telephone Encounter (Signed)
Pt made aware of lab results.

## 2016-01-20 DIAGNOSIS — R358 Other polyuria: Secondary | ICD-10-CM | POA: Diagnosis not present

## 2016-01-20 DIAGNOSIS — N390 Urinary tract infection, site not specified: Secondary | ICD-10-CM | POA: Diagnosis not present

## 2016-01-25 ENCOUNTER — Telehealth (HOSPITAL_COMMUNITY): Payer: Self-pay

## 2016-01-25 NOTE — Telephone Encounter (Signed)
Encounter complete. 

## 2016-01-27 ENCOUNTER — Ambulatory Visit (HOSPITAL_COMMUNITY)
Admission: RE | Admit: 2016-01-27 | Discharge: 2016-01-27 | Disposition: A | Discharge status: Home / Self Care | Payer: Medicare Other | Source: Ambulatory Visit | Attending: Cardiology | Admitting: Cardiology

## 2016-01-27 ENCOUNTER — Ambulatory Visit (INDEPENDENT_AMBULATORY_CARE_PROVIDER_SITE_OTHER): Payer: Medicare Other

## 2016-01-27 DIAGNOSIS — R0609 Other forms of dyspnea: Secondary | ICD-10-CM | POA: Insufficient documentation

## 2016-01-27 DIAGNOSIS — I2510 Atherosclerotic heart disease of native coronary artery without angina pectoris: Secondary | ICD-10-CM

## 2016-01-27 DIAGNOSIS — R42 Dizziness and giddiness: Secondary | ICD-10-CM | POA: Insufficient documentation

## 2016-01-27 DIAGNOSIS — R079 Chest pain, unspecified: Secondary | ICD-10-CM | POA: Diagnosis not present

## 2016-01-27 DIAGNOSIS — I480 Paroxysmal atrial fibrillation: Secondary | ICD-10-CM | POA: Diagnosis not present

## 2016-01-27 DIAGNOSIS — R5383 Other fatigue: Secondary | ICD-10-CM | POA: Diagnosis not present

## 2016-01-27 DIAGNOSIS — I10 Essential (primary) hypertension: Secondary | ICD-10-CM | POA: Diagnosis not present

## 2016-01-27 DIAGNOSIS — Z9861 Coronary angioplasty status: Secondary | ICD-10-CM

## 2016-01-27 DIAGNOSIS — R002 Palpitations: Secondary | ICD-10-CM | POA: Diagnosis not present

## 2016-01-27 DIAGNOSIS — Z8249 Family history of ischemic heart disease and other diseases of the circulatory system: Secondary | ICD-10-CM | POA: Diagnosis not present

## 2016-01-27 LAB — MYOCARDIAL PERFUSION IMAGING
CHL CUP NUCLEAR SSS: 7
CSEPPHR: 57 {beats}/min
LV dias vol: 99 mL (ref 46–106)
LVSYSVOL: 37 mL
Rest HR: 54 {beats}/min
SDS: 7
SRS: 0
TID: 1.05

## 2016-01-27 MED ORDER — REGADENOSON 0.4 MG/5ML IV SOLN
0.4000 mg | Freq: Once | INTRAVENOUS | Status: AC
  Administered 2016-01-27: 0.4 mg via INTRAVENOUS

## 2016-01-27 MED ORDER — TECHNETIUM TC 99M SESTAMIBI GENERIC - CARDIOLITE
9.8000 | Freq: Once | INTRAVENOUS | Status: AC | PRN
  Administered 2016-01-27: 9.8 via INTRAVENOUS

## 2016-01-27 MED ORDER — AMINOPHYLLINE 25 MG/ML IV SOLN
75.0000 mg | Freq: Once | INTRAVENOUS | Status: AC
  Administered 2016-01-27: 75 mg via INTRAVENOUS

## 2016-01-27 MED ORDER — TECHNETIUM TC 99M SESTAMIBI GENERIC - CARDIOLITE
30.8000 | Freq: Once | INTRAVENOUS | Status: AC | PRN
  Administered 2016-01-27: 30.8 via INTRAVENOUS

## 2016-01-27 NOTE — Progress Notes (Unsigned)
Pt here today to have event monitor placed. Pt's grandson present with her. [employer] event monitor placed on pt.  Instructions given as written in monitor instruction book. Reinforced with pt to NOT SHOWER WITH MONITOR, to keep the second monitor charged, to charge phone regularly, to change out the monitor every 12 hours.  Instructed pt to place all pieces in the provided box and mail at closest [place] store.  Pt and grandson verbalized understanding.

## 2016-01-27 NOTE — Patient Instructions (Signed)
Pt here today to have event monitor placed. Pt's grandson present with her. [employer] event monitor placed on pt.  Instructions given as written in monitor instruction book. Reinforced with pt to NOT SHOWER WITH MONITOR, to keep the second monitor charged, to charge phone regularly, to change out the monitor every 12 hours.  Instructed pt to place all pieces in the provided box and mail at closest [place] store.  Pt and grandson verbalized understanding.

## 2016-01-31 ENCOUNTER — Telehealth: Payer: Self-pay | Admitting: Cardiology

## 2016-01-31 NOTE — Telephone Encounter (Signed)
Returned call to grandson Jeanne Hart and informed him monitor was ordered for 30 days. He states it is driving the patient crazy. Advised that if for some reason it become unbearable and patient is wanting to remove monitor to please call the office in advance so we can pull up monitor reports to verify enough data has been collected. He voiced understanding.

## 2016-01-31 NOTE — Telephone Encounter (Signed)
New Message  Pt grandson called to discuss how long the pt is to wear the monitor. Please call

## 2016-02-01 ENCOUNTER — Telehealth: Payer: Self-pay | Admitting: *Deleted

## 2016-02-01 NOTE — Telephone Encounter (Signed)
-----   Message from Theodore Demark, RN sent at 02/01/2016  8:10 AM EDT -----   ----- Message -----    From: Thompson Grayer, RN    Sent: 02/01/2016   8:06 AM      To: [facility]

## 2016-02-01 NOTE — Telephone Encounter (Signed)
Spoke to patient. Result given . Verbalized understanding Spoke to patient she states the monitor is not working  Would to take it off Patient state the monitor is not hooking up to the adhesive area Patient had monitor placed on 01/27/16. Will call later to speak grandson to see if troubleshoot over the phone Will defer to B. Samara Snide

## 2016-02-08 DIAGNOSIS — I129 Hypertensive chronic kidney disease with stage 1 through stage 4 chronic kidney disease, or unspecified chronic kidney disease: Secondary | ICD-10-CM | POA: Diagnosis not present

## 2016-02-08 DIAGNOSIS — I10 Essential (primary) hypertension: Secondary | ICD-10-CM | POA: Diagnosis not present

## 2016-02-08 DIAGNOSIS — E039 Hypothyroidism, unspecified: Secondary | ICD-10-CM | POA: Diagnosis not present

## 2016-02-14 DIAGNOSIS — I4891 Unspecified atrial fibrillation: Secondary | ICD-10-CM | POA: Diagnosis not present

## 2016-02-14 DIAGNOSIS — I129 Hypertensive chronic kidney disease with stage 1 through stage 4 chronic kidney disease, or unspecified chronic kidney disease: Secondary | ICD-10-CM | POA: Diagnosis not present

## 2016-02-14 DIAGNOSIS — D709 Neutropenia, unspecified: Secondary | ICD-10-CM | POA: Diagnosis not present

## 2016-02-14 DIAGNOSIS — N184 Chronic kidney disease, stage 4 (severe): Secondary | ICD-10-CM | POA: Diagnosis not present

## 2016-02-15 ENCOUNTER — Ambulatory Visit (INDEPENDENT_AMBULATORY_CARE_PROVIDER_SITE_OTHER): Payer: Medicare Other | Admitting: Cardiology

## 2016-02-15 ENCOUNTER — Other Ambulatory Visit: Payer: Self-pay | Admitting: Physician Assistant

## 2016-02-15 ENCOUNTER — Encounter: Payer: Self-pay | Admitting: Cardiology

## 2016-02-15 VITALS — BP 136/78 | HR 50 | Ht 66.0 in | Wt 150.4 lb

## 2016-02-15 DIAGNOSIS — I2510 Atherosclerotic heart disease of native coronary artery without angina pectoris: Secondary | ICD-10-CM | POA: Diagnosis not present

## 2016-02-15 DIAGNOSIS — R002 Palpitations: Secondary | ICD-10-CM

## 2016-02-15 DIAGNOSIS — E785 Hyperlipidemia, unspecified: Secondary | ICD-10-CM

## 2016-02-15 DIAGNOSIS — M7989 Other specified soft tissue disorders: Secondary | ICD-10-CM

## 2016-02-15 DIAGNOSIS — I480 Paroxysmal atrial fibrillation: Secondary | ICD-10-CM

## 2016-02-15 DIAGNOSIS — Z9861 Coronary angioplasty status: Secondary | ICD-10-CM

## 2016-02-15 DIAGNOSIS — I10 Essential (primary) hypertension: Secondary | ICD-10-CM

## 2016-02-15 NOTE — Patient Instructions (Signed)
PLEASE CHANGE  TAKING ATENOLOL TO AFTER LUNCH.  OFFICE WILL CONTACT YOU WHEN MONITOR RESULT ARE COMPLETED.   Your physician wants you to follow-up in 1-2 MONTHS WITH DR HARDING - 30 MIN.   If you need a refill on your cardiac medications before your next appointment, please call your pharmacy.

## 2016-02-15 NOTE — Progress Notes (Signed)
PCP: Thressa Sheller, MD  Clinic Note: Chief Complaint  Patient presents with  . Follow-up    Palpitations, rapid heart rate.  . Coronary Artery Disease  . Atrial Fibrillation    HPI: Jeanne Hart is a 82 y.o. female with a PMH below who presents today for 1 month f/u - h/o PAF (off of warfarin after GI Bleed), CAD-PTCA-PCI x 2 . I last saw her in Aug 2016. Was seen by Mr. Samara Snide, [place] x 2 since.  Jeanne Hart was last seen on January 11, 2016 by Mr. Samara Snide. C/o rapid HR & occasional SOB.  - Event Monitor ordered (results not ready to read) - leads were driving her crazyy; So far only day 1 and 3 reports are available. These essentially show sinus rhythm with sinus bradycardia. No arrhythmias. Day 3 was noted to have patient feeling tired with chest pressure. - Myoview 01/27/2016:      The left ventricular ejection fraction is normal (55-65%).  Nuclear stress EF: 63%.  There was no ST segment deviation noted during stress.  The study is normal.  This is a low risk study.  Normal pharmacologic nuclear stress test with no evidence of prior infarct or ischemia.     Interval History: Jeanne Hart for follow-up of her monitor and Myoview. She still complains of symptoms, however she is a very difficult historian and has to be redirected to multiple occasions. Is very difficult to get her to answer question despite efforts of both myself and her son.  She still complains of rapid HR spells - lasts several hours.  Associated with chest pain.  Has spells every day for the past few months. Despite her saying this, the only 2 days I have any reports on from her monitor did not show anything. She actually complained of symptoms and there was nothing. As for the chest discomfort her Myoview is negative. It would appear that the full report has not been put in the system yet. When she has the rapid sensation she feels like a fluttering sensation in her chest and that makes her have a  tightness. Unfortunately she had a hard time wearing the monitor, especially while at night because she rolls around a lot because of insomnia and the leads would come off and she just got frustrated with it. . I did not get a sense that she has any PND, orthopnea.. As for chest discomfort I really couldn't tell what she was saying. It sounds like she has some exertional dyspnea and when she is feeling the palpitations, she has discomfort in her chest. She does feel a little lightheaded and dizzy when she has her spells, but has not had any syncope or near syncope. No TIA/amaurosis fugax symptoms.  She says that the tachypalpitations spells tend to happen in the afternoon and her pre-much gone by dinnertime or bedtime. They're really not necessarily associated with any particular activity. She is noted a little bit of increased edema, but is usually controlled with the Lasix.  Unfortunately recently she's been given with a UTI, so she has not been taking her Lasix to avoid excess urination that started occurring. As a result her edema has gotten worse.  ROS: A comprehensive was performed. Review of Systems  Constitutional: Negative for malaise/fatigue.  HENT: Negative for congestion and nosebleeds.   Respiratory: Positive for shortness of breath. Negative for cough and wheezing.   Cardiovascular: Negative for claudication.       Per history of present  illness  Gastrointestinal: Negative for blood in stool and melena.  Genitourinary: Negative for hematuria.  Musculoskeletal: Positive for back pain and joint pain. Negative for falls.  Neurological: Positive for dizziness (With palpitations) and headaches.  Endo/Heme/Allergies: Does not bruise/bleed easily.  Psychiatric/Behavioral: The patient is nervous/anxious.   All other systems reviewed and are negative.    Past Medical History  Diagnosis Date  . Insomnia     chronic  . IBS (irritable bowel syndrome)   . HTN (hypertension)   .  Dyslipidemia   . Family history of cardiovascular disease   . PAF (paroxysmal atrial fibrillation) (HCC)     coumadin was stopped secondary to GI Bleed, on amiodarone; monitor march 2014-[facility]  . CAD S/P percutaneous coronary angioplasty     PCI x 2; once at [facility] and once in [place];; Myoview April 2017: LOW RISK. EF 63%. No ischemia or infarction.  . Hypothyroidism   . Dyspnea on exertion   . GERD (gastroesophageal reflux disease)   . Diverticulitis     Past Surgical History  Procedure Laterality Date  . Coronary angioplasty with stent placement  01/05/2011    2.5x32mm Promus DES stent to RCA post dilated to 2.75x48mm   . Coronary angioplasty with stent placement  ?    3 stents per patient  . Appendectomy    . Cholecystectomy    . Abdominal hysterectomy      Prior to Admission medications   Medication Sig Start Date End Date Taking? Authorizing Provider  amiodarone (PACERONE) 200 MG tablet TAKE 1 TABLET BY MOUTH ONCE DAILY AND 1 TABLET AS NEEDED FOR FAST HEART RATE 08/27/15  Yes Leonie Man, MD  amLODipine (NORVASC) 5 MG tablet Take 1 tablet by mouth daily. take 1 tab dialy 05/07/15  Yes Historical Provider, MD  Ascorbic Acid (VITAMIN C PO) Take 1 tablet by mouth daily.    Yes Historical Provider, MD  aspirin EC 81 MG tablet Take 1 tablet (81 mg total) by mouth daily. 02/15/15  Yes Leonie Man, MD  atenolol (TENORMIN) 25 MG tablet Take 25 mg by mouth daily.  02/28/14  Yes Historical Provider, MD  Biotin 10 MG CAPS Take by mouth.   Yes Historical Provider, MD  Cholecalciferol (VITAMIN D PO) Take 1 tablet by mouth daily.    Yes Historical Provider, MD  ferrous sulfate 325 (65 FE) MG tablet Take one po BID Patient taking differently: Take 325 mg by mouth 2 (two) times daily with a meal.  04/06/14  Yes Jessica D Zehr, PA-C  FLUOCINOLONE ACETONIDE SCALP 0.01 % OIL Apply 1 application topically as needed. No specific days 12/21/14  Yes Historical Provider, MD  furosemide (LASIX)  20 MG tablet Take 1 tablet (20 mg total) by mouth daily as needed for edema (as needed for leg swelling.). 03/29/15  Yes Leonie Man, MD  isosorbide mononitrate (IMDUR) 30 MG 24 hr tablet Take 1 tablet (30 mg total) by mouth daily. 09/09/14  Yes Margarita Mail, PA-C  ketoconazole (NIZORAL) 2 % shampoo Use as directed 04/19/15  Yes Historical Provider, MD  levothyroxine (SYNTHROID, LEVOTHROID) 100 MCG tablet Take 100 mcg by mouth daily before breakfast.   Yes Historical Provider, MD  nitroGLYCERIN (NITROSTAT) 0.4 MG SL tablet Place 1 tablet (0.4 mg total) under the tongue every 5 (five) minutes as needed for chest pain. MAX 3 doses 01/12/16  Yes Mihai Croitoru, MD  pantoprazole (PROTONIX) 40 MG tablet Take 40 mg by mouth daily.  Yes Historical Provider, MD  Polyethyl Glycol-Propyl Glycol (SYSTANE) 0.4-0.3 % SOLN Apply 2 drops to eye at bedtime.   Yes Historical Provider, MD  pravastatin (PRAVACHOL) 20 MG tablet Take 1 tablet by mouth daily. Take 1 tab daily 05/30/15  Yes Historical Provider, MD  zolpidem (AMBIEN CR) 12.5 MG CR tablet Take 12.5 mg by mouth every other day.   Yes Historical Provider, MD   Allergies  Allergen Reactions  . Nexium [Esomeprazole] Other (See Comments)    Aggravates IBS.   . Statins Other (See Comments)    Had jaundice as child.  Does tolerate current statin therapy.    . Codeine Other (See Comments)    Post-surgical reaction of unknown type.  . Morphine And Related Other (See Comments)    Unknown.    Marland Kitchen Penicillins Other (See Comments)    Unknown.  . Sulfa Antibiotics Other (See Comments)    Unknown childhood reaction.       Social History   Social History  . Marital Status: Widowed    Spouse Name: N/A  . Number of Children: 3  . Years of Education: N/A   Occupational History  . Sales/Office-retired    Social History Main Topics  . Smoking status: Never Smoker   . Smokeless tobacco: Never Used  . Alcohol Use: No  . Drug Use: No  . Sexual Activity:  Not Asked   Other Topics Concern  . None   Social History Narrative   Family History  Problem Relation Age of Onset  . Diabetes Maternal Aunt   . Diabetes Son   . Heart disease      Both sides of family  . Colon cancer Neg Hx     Wt Readings from Last 3 Encounters:  02/15/16 150 lb 6.4 oz (68.221 kg)  01/27/16 154 lb (69.854 kg)  01/11/16 154 lb (69.854 kg)    PHYSICAL EXAM BP 136/78 mmHg  Pulse 50  Ht 5\' 6"  (1.676 m)  Wt 150 lb 6.4 oz (68.221 kg)  BMI 24.29 kg/m2 General appearance: alert, cooperative, appears stated age, no distress - but seems concerned and otherwise healthy. Neck: no adenopathy, no carotid bruit and no JVD Lungs: Mostly CTAB, normal percussion bilaterally and non-labored Heart: regular rate and rhythm, S1&S2 normal, no murmur, + S4 gallop, no rubs. Nondisplaced PMI. Abdomen: soft, non-tender; bowel sounds normal; no masses, no organomegaly; no HJR Extremities: extremities normal, atraumatic, no cyanosis, and trace to 1+ edema  Pulses: 2+ and symmetric; Skin: normal and mobility and turgor normal  Neurologic: Mental status: Alert, oriented, thought content appropriate Cranial nerves: normal (II-XII grossly intact)    Adult ECG Report n/a   Other studies Reviewed: Additional studies/ records that were reviewed today include:  Recent Labs:   Lab Results  Component Value Date   TSH 1.30 01/11/2016   Lab Results  Component Value Date   HGB 13.7 08/27/2015     ASSESSMENT / PLAN: Problem List Items Addressed This Visit    Paroxysmal atrial fibrillation ([place]) (Chronic)    Still waiting to see if she is truly having recurrent episodes. Looking for the monitor results. She is on amiodarone and atenolol. But I would like for her to do is continue taking amiodarone in the morning as she is doing, but switch the atenolol to roughly lunchtime. This will allow for better rate control to control her heart rates in the afternoon when it seems to be  faster.  This patients CHA2DS2-VASc Score and unadjusted  Ischemic Stroke Rate (% per year) is equal to 4.8 % stroke rate/year from a score of 4.  However, as she seems to be by monitor rhythm control with amiodarone, we had chosen to be not useful endocoagulation because of her history of GI bleed. If the monitor to assure that she is having in and out spells of A. fib, then I think we need to reconsider warfarin versus DOAC.        Palpitations - Primary (Chronic)    Interestingly, she had a monitor in the past that failed to show any arrhythmias, and so far the only 2 days of her monitor that she actually wore the monitor did not show any arrhythmias. Not sure what to make of her fluttering sensations.  We'll continue to follow up the results of her event monitor once it is fully reported. Concerning thing would be as if she is truly having short bursts of A. fib, then we probably need to be a little bit more aggressive with switching back to full anticoagulation.       Hyperlipidemia with target LDL less than 70 (Chronic)    On pravastatin. Labs are monitored by PCP and I don't have the results.      Foot swelling    This had been stable with low-dose Lasix. If she doesn't take it then her swelling does recur. We talked about using compression hose, but she said that they are too hard to put on.      Essential hypertension (Chronic)    Blood pressure looks pretty good today. No necessary changes.      CAD S/P percutaneous coronary angioplasty (Chronic)    Her symptoms are very atypical, but was definitely reasonable to evaluate for ischemia. Does not appeared that she is truly having macrovascular ischemia based on her stress test.  She remains on aspirin, beta blocker and Imdur. She is on amlodipine for blood pressure control/antianginal effect as well as potential coronary spasm.         Current medicines are reviewed at length with the patient today. (+/- concerns) Taking  amiodarone a couple times a week to try to keep heart rate stable. The following changes have been made:  PLEASE CHANGE  TAKING ATENOLOL TO AFTER LUNCH.  OFFICE WILL CONTACT YOU WHEN MONITOR RESULT ARE COMPLETED.   Your physician wants you to follow-up in 1-2 MONTHS WITH DR HARDING - 30 MIN.  Studies Ordered:   No orders of the defined types were placed in this encounter.      Leonie Man, M.D., M.S. Interventional Cardiologist   Pager # [phone] Phone # [phone] [address] [place], [place] [address]

## 2016-02-16 ENCOUNTER — Encounter: Payer: Self-pay | Admitting: Cardiology

## 2016-02-16 NOTE — Assessment & Plan Note (Signed)
Interestingly, she had a monitor in the past that failed to show any arrhythmias, and so far the only 2 days of her monitor that she actually wore the monitor did not show any arrhythmias. Not sure what to make of her fluttering sensations.  We'll continue to follow up the results of her event monitor once it is fully reported. Concerning thing would be as if she is truly having short bursts of A. fib, then we probably need to be a little bit more aggressive with switching back to full anticoagulation.

## 2016-02-16 NOTE — Assessment & Plan Note (Signed)
This had been stable with low-dose Lasix. If she doesn't take it then her swelling does recur. We talked about using compression hose, but she said that they are too hard to put on.

## 2016-02-16 NOTE — Assessment & Plan Note (Signed)
Her symptoms are very atypical, but was definitely reasonable to evaluate for ischemia. Does not appeared that she is truly having macrovascular ischemia based on her stress test.  She remains on aspirin, beta blocker and Imdur. She is on amlodipine for blood pressure control/antianginal effect as well as potential coronary spasm.

## 2016-02-16 NOTE — Assessment & Plan Note (Signed)
Blood pressure looks pretty good today. No necessary changes.

## 2016-02-16 NOTE — Assessment & Plan Note (Addendum)
Still waiting to see if she is truly having recurrent episodes. Looking for the monitor results. She is on amiodarone and atenolol. But I would like for her to do is continue taking amiodarone in the morning as she is doing, but switch the atenolol to roughly lunchtime. This will allow for better rate control to control her heart rates in the afternoon when it seems to be faster.  This patients CHA2DS2-VASc Score and unadjusted Ischemic Stroke Rate (% per year) is equal to 4.8 % stroke rate/year from a score of 4.  However, as she seems to be by monitor rhythm control with amiodarone, we had chosen to be not useful endocoagulation because of her history of GI bleed. If the monitor to assure that she is having in and out spells of A. fib, then I think we need to reconsider warfarin versus DOAC.

## 2016-02-16 NOTE — Assessment & Plan Note (Signed)
On pravastatin. Labs are monitored by PCP and I don't have the results.

## 2016-02-18 ENCOUNTER — Ambulatory Visit: Payer: Medicare Other | Admitting: Cardiology

## 2016-03-05 ENCOUNTER — Emergency Department (HOSPITAL_COMMUNITY): Payer: Medicare Other

## 2016-03-05 ENCOUNTER — Emergency Department (HOSPITAL_COMMUNITY)
Admission: EM | Admit: 2016-03-05 | Discharge: 2016-03-05 | Disposition: A | Discharge status: Home / Self Care | Payer: Medicare Other | Attending: Emergency Medicine | Admitting: Emergency Medicine

## 2016-03-05 ENCOUNTER — Encounter (HOSPITAL_COMMUNITY): Payer: Self-pay | Admitting: Emergency Medicine

## 2016-03-05 DIAGNOSIS — E039 Hypothyroidism, unspecified: Secondary | ICD-10-CM | POA: Diagnosis not present

## 2016-03-05 DIAGNOSIS — I480 Paroxysmal atrial fibrillation: Secondary | ICD-10-CM | POA: Diagnosis not present

## 2016-03-05 DIAGNOSIS — S79911A Unspecified injury of right hip, initial encounter: Secondary | ICD-10-CM | POA: Diagnosis not present

## 2016-03-05 DIAGNOSIS — R079 Chest pain, unspecified: Secondary | ICD-10-CM | POA: Insufficient documentation

## 2016-03-05 DIAGNOSIS — I2510 Atherosclerotic heart disease of native coronary artery without angina pectoris: Secondary | ICD-10-CM | POA: Insufficient documentation

## 2016-03-05 DIAGNOSIS — Z7982 Long term (current) use of aspirin: Secondary | ICD-10-CM | POA: Diagnosis not present

## 2016-03-05 DIAGNOSIS — R5383 Other fatigue: Secondary | ICD-10-CM | POA: Diagnosis not present

## 2016-03-05 DIAGNOSIS — Z79899 Other long term (current) drug therapy: Secondary | ICD-10-CM | POA: Insufficient documentation

## 2016-03-05 DIAGNOSIS — W1839XA Other fall on same level, initial encounter: Secondary | ICD-10-CM | POA: Diagnosis not present

## 2016-03-05 DIAGNOSIS — Y929 Unspecified place or not applicable: Secondary | ICD-10-CM | POA: Diagnosis not present

## 2016-03-05 DIAGNOSIS — E785 Hyperlipidemia, unspecified: Secondary | ICD-10-CM | POA: Diagnosis not present

## 2016-03-05 DIAGNOSIS — I10 Essential (primary) hypertension: Secondary | ICD-10-CM | POA: Diagnosis not present

## 2016-03-05 DIAGNOSIS — Y939 Activity, unspecified: Secondary | ICD-10-CM | POA: Diagnosis not present

## 2016-03-05 DIAGNOSIS — R531 Weakness: Secondary | ICD-10-CM | POA: Insufficient documentation

## 2016-03-05 DIAGNOSIS — R42 Dizziness and giddiness: Secondary | ICD-10-CM | POA: Diagnosis not present

## 2016-03-05 DIAGNOSIS — W19XXXA Unspecified fall, initial encounter: Secondary | ICD-10-CM

## 2016-03-05 DIAGNOSIS — Y999 Unspecified external cause status: Secondary | ICD-10-CM | POA: Diagnosis not present

## 2016-03-05 DIAGNOSIS — R404 Transient alteration of awareness: Secondary | ICD-10-CM | POA: Diagnosis not present

## 2016-03-05 DIAGNOSIS — M25551 Pain in right hip: Secondary | ICD-10-CM | POA: Diagnosis not present

## 2016-03-05 LAB — BASIC METABOLIC PANEL
ANION GAP: 5 (ref 5–15)
BUN: 13 mg/dL (ref 6–20)
CALCIUM: 9.2 mg/dL (ref 8.9–10.3)
CO2: 26 mmol/L (ref 22–32)
CREATININE: 1.25 mg/dL — AB (ref 0.44–1.00)
Chloride: 108 mmol/L (ref 101–111)
GFR, EST AFRICAN AMERICAN: 44 mL/min — AB (ref >60)
GFR, EST NON AFRICAN AMERICAN: 38 mL/min — AB (ref >60)
Glucose, Bld: 145 mg/dL — ABNORMAL HIGH (ref 65–99)
POTASSIUM: 3.2 mmol/L — AB (ref 3.5–5.1)
Sodium: 139 mmol/L (ref 135–145)

## 2016-03-05 LAB — CBC
HCT: 38 % (ref 36.0–46.0)
Hemoglobin: 12.2 g/dL (ref 12.0–15.0)
MCH: 30 pg (ref 26.0–34.0)
MCHC: 32.1 g/dL (ref 30.0–36.0)
MCV: 93.4 fL (ref 78.0–100.0)
PLATELETS: 133 10*3/uL — AB (ref 150–400)
RBC: 4.07 MIL/uL (ref 3.87–5.11)
RDW: 14.4 % (ref 11.5–15.5)
WBC: 3.6 10*3/uL — AB (ref 4.0–10.5)

## 2016-03-05 LAB — I-STAT TROPONIN, ED: TROPONIN I, POC: 0.01 ng/mL (ref 0.00–0.08)

## 2016-03-05 MED ORDER — POTASSIUM CHLORIDE CRYS ER 20 MEQ PO TBCR
40.0000 meq | EXTENDED_RELEASE_TABLET | Freq: Once | ORAL | Status: AC
  Administered 2016-03-05: 40 meq via ORAL
  Filled 2016-03-05: qty 2

## 2016-03-05 MED ORDER — POTASSIUM CHLORIDE ER 10 MEQ PO TBCR: 10.0000 meq | EXTENDED_RELEASE_TABLET | Freq: Every day | ORAL | Status: AC

## 2016-03-05 NOTE — ED Notes (Signed)
Family at bedside. 

## 2016-03-05 NOTE — Discharge Instructions (Signed)
You have low potassium. Take the potassium supplement. Recheck your potassium level in a few days with your primary care doctor.

## 2016-03-05 NOTE — ED Notes (Signed)
MD at bedside. [place]

## 2016-03-05 NOTE — ED Notes (Addendum)
Pt states over the last 3 days she has had intermittent chest pain with dizziness that has came and went. Pt states she didn't sleep any last night and was dizzy pt reports dizziness got worse after getting home from church today around 1230 pm. Pt states she fell back onto her bottom while changing her clothes. Pt denies any dizziness at this time pt states mostly occurs upon standing. Pt is alert and ox4. Pt states she did not hit her head or have any LOC when she fell.

## 2016-03-05 NOTE — ED Provider Notes (Signed)
CSN: [id number]     Arrival date & time 03/05/16  1354 History   First MD Initiated Contact with Patient 03/05/16 1401     Chief Complaint  Patient presents with  . Weakness  . Chest Pain     (Consider location/radiation/quality/duration/timing/severity/associated sxs/prior Treatment) Patient is a 82 y.o. female presenting with general illness. The history is provided by the patient and medical records.  Illness Location:  Mechanical fall Severity:  Mild Onset quality:  Sudden Duration:  1 hour Timing:  Unable to specify Progression:  Unchanged Chronicity:  New Context:  Patient reports that she has a history of insomnia. Last night did not sleep at all. Was feeling fatigued and weak because of this. Was attempting to get dressed today, felt weak, fell to the floor. Did not hit her head, no loss consciousness. He is having some pain in her left hip. No other pain. Associated symptoms: fatigue   Associated symptoms: no abdominal pain, no chest pain, no diarrhea, no fever, no headaches, no nausea, no shortness of breath and no vomiting     Past Medical History  Diagnosis Date  . Insomnia     chronic  . IBS (irritable bowel syndrome)   . HTN (hypertension)   . Dyslipidemia   . Family history of cardiovascular disease   . PAF (paroxysmal atrial fibrillation) (HCC)     coumadin was stopped secondary to GI Bleed, on amiodarone; monitor march 2014-NSR  . CAD S/P percutaneous coronary angioplasty     PCI x 2; once at [facility] and once in [place];; Myoview April 2017: LOW RISK. EF 63%. No ischemia or infarction.  . Hypothyroidism   . Dyspnea on exertion   . GERD (gastroesophageal reflux disease)   . Diverticulitis    Past Surgical History  Procedure Laterality Date  . Coronary angioplasty with stent placement  01/05/2011    2.5x2mm Promus DES stent to RCA post dilated to 2.75x75mm   . Coronary angioplasty with stent placement  ?    3 stents per patient  . Appendectomy     . Cholecystectomy    . Abdominal hysterectomy     Family History  Problem Relation Age of Onset  . Diabetes Maternal Aunt   . Diabetes Son   . Heart disease      Both sides of family  . Colon cancer Neg Hx    Social History  Substance Use Topics  . Smoking status: Never Smoker   . Smokeless tobacco: Never Used  . Alcohol Use: No   OB History    No data available     Review of Systems  Constitutional: Positive for fatigue. Negative for fever and chills.  Eyes: Negative for visual disturbance.  Respiratory: Negative for shortness of breath.   Cardiovascular: Negative for chest pain.  Gastrointestinal: Negative for nausea, vomiting, abdominal pain and diarrhea.  Musculoskeletal: Negative for back pain and neck pain.  Skin: Negative for wound.  Neurological: Positive for light-headedness. Negative for syncope, facial asymmetry, speech difficulty, weakness, numbness and headaches.  All other systems reviewed and are negative.     Allergies  Nexium; Statins; Codeine; Morphine and related; Penicillins; and Sulfa antibiotics  Home Medications   Prior to Admission medications   Medication Sig Start Date End Date Taking? Authorizing Provider  amiodarone (PACERONE) 200 MG tablet TAKE 1 TABLET BY MOUTH ONCE DAILY AND 1 TABLET AS NEEDED FOR FAST HEART RATE 02/15/16   Leonie Man, MD  amLODipine (NORVASC) 5 MG tablet  Take 1 tablet by mouth daily. take 1 tab dialy 05/07/15   Historical Provider, MD  Ascorbic Acid (VITAMIN C PO) Take 1 tablet by mouth daily.     Historical Provider, MD  aspirin EC 81 MG tablet Take 1 tablet (81 mg total) by mouth daily. 02/15/15   Leonie Man, MD  atenolol (TENORMIN) 25 MG tablet Take 25 mg by mouth daily after lunch. 02/28/14   Historical Provider, MD  Biotin 10 MG CAPS Take by mouth.    Historical Provider, MD  Cholecalciferol (VITAMIN D PO) Take 1 tablet by mouth daily.     Historical Provider, MD  ferrous sulfate 325 (65 FE) MG tablet Take  one po BID Patient taking differently: Take 325 mg by mouth 2 (two) times daily with a meal.  04/06/14   Jessica D Zehr, PA-C  FLUOCINOLONE ACETONIDE SCALP 0.01 % OIL Apply 1 application topically as needed. No specific days 12/21/14   Historical Provider, MD  furosemide (LASIX) 20 MG tablet Take 1 tablet (20 mg total) by mouth daily as needed for edema (as needed for leg swelling.). 03/29/15   Leonie Man, MD  isosorbide mononitrate (IMDUR) 30 MG 24 hr tablet Take 1 tablet (30 mg total) by mouth daily. 09/09/14   Margarita Mail, PA-C  ketoconazole (NIZORAL) 2 % shampoo Use as directed 04/19/15   Historical Provider, MD  levothyroxine (SYNTHROID, LEVOTHROID) 100 MCG tablet Take 100 mcg by mouth daily before breakfast.    Historical Provider, MD  nitroGLYCERIN (NITROSTAT) 0.4 MG SL tablet Place 1 tablet (0.4 mg total) under the tongue every 5 (five) minutes as needed for chest pain. MAX 3 doses 01/12/16   Mihai Croitoru, MD  pantoprazole (PROTONIX) 40 MG tablet Take 40 mg by mouth daily.     Historical Provider, MD  Polyethyl Glycol-Propyl Glycol (SYSTANE) 0.4-0.3 % SOLN Apply 2 drops to eye at bedtime.    Historical Provider, MD  potassium chloride (K-DUR) 10 MEQ tablet Take 1 tablet (10 mEq total) by mouth daily. 03/05/16   Maryan Puls, MD  pravastatin (PRAVACHOL) 20 MG tablet Take 1 tablet by mouth daily. Take 1 tab daily 05/30/15   Historical Provider, MD  zolpidem (AMBIEN CR) 12.5 MG CR tablet Take 12.5 mg by mouth every other day.    Historical Provider, MD   BP 183/66 mmHg  Pulse 66  Temp(Src) 98.3 F (36.8 C) (Oral)  Resp 16  Ht 5\' 3"  (1.6 m)  Wt 68.04 kg  BMI 26.58 kg/m2  SpO2 98% Physical Exam  Constitutional: She is oriented to person, place, and time. She appears well-developed and well-nourished. No distress.  HENT:  Head: Normocephalic and atraumatic.  Mouth/Throat: Mucous membranes are not dry.  Eyes: EOM are normal. Pupils are equal, round, and reactive to light.  Neck:  Normal range of motion. No spinous process tenderness present.  Cardiovascular: Normal rate and regular rhythm.   Pulmonary/Chest: No tachypnea. No respiratory distress.  Abdominal: Soft. Normal appearance. There is no tenderness. There is no rebound and no guarding.  Musculoskeletal:       Left lower leg: She exhibits swelling (Consistent with baseline).  Neurological: She is alert and oriented to person, place, and time. She has normal strength. No cranial nerve deficit or sensory deficit. Coordination and gait normal. GCS eye subscore is 4. GCS verbal subscore is 5. GCS motor subscore is 6.  Skin: Skin is warm and dry.    ED Course  Procedures (including critical care time) Labs  Review Labs Reviewed  BASIC METABOLIC PANEL - Abnormal; Notable for the following:    Potassium 3.2 (*)    Glucose, Bld 145 (*)    Creatinine, Ser 1.25 (*)    GFR calc non Af Amer 38 (*)    GFR calc Af Amer 44 (*)    All other components within normal limits  CBC - Abnormal; Notable for the following:    WBC 3.6 (*)    Platelets 133 (*)    All other components within normal limits  I-STAT TROPOININ, ED    Imaging Review Dg Chest 2 View  03/05/2016  CLINICAL DATA:  Pt states she fell backwards on her bottom today while changing her clothes in her carpeted bedroom. Pt c/o posterior right hip pain, weakness, mid-sternal chest pains and pressure, SOB, imbalance and upper left arm pain. EXAM: CHEST  2 VIEW COMPARISON:  05/10/2015 FINDINGS: Lateral view degraded by patient arm position. Accentuation of expected thoracic kyphosis. Thoracolumbar junction mild compression deformity, felt to be similar. Midline trachea. Moderate cardiomegaly with transverse aortic atherosclerosis. No pneumothorax. Suspect trace bilateral pleural effusions. Mild pulmonary interstitial thickening. No lobar consolidation. IMPRESSION: Cardiomegaly and mild pulmonary venous congestion. No acute or posttraumatic deformity identified.  Electronically Signed   By: Abigail Miyamoto M.D.   On: 03/05/2016 15:44   Dg Hip Unilat With Pelvis 2-3 Views Right  03/05/2016  CLINICAL DATA:  Status post fall backwards. Posterior right hip pain. EXAM: DG HIP (WITH OR WITHOUT PELVIS) 2-3V RIGHT COMPARISON:  None. FINDINGS: There is no evidence of hip fracture or dislocation. The joint spaces are maintained. There is generalized osteopenia. There are degenerative changes of the lower lumbar spine and bilateral sacroiliac joints. IMPRESSION: No acute osseous injury of the right hip. Electronically Signed   By: Kathreen Devoid   On: 03/05/2016 15:44   I have personally reviewed and evaluated these images and lab results as part of my medical decision-making.   EKG Interpretation   Date/Time:  Sunday Mar 05 2016 13:55:02 EDT Ventricular Rate:  63 PR Interval:  206 QRS Duration: 107 QT Interval:  512 QTC Calculation: 524 R Axis:   47 Text Interpretation:  Sinus rhythm Incomplete left bundle branch block  Prolonged QT interval Baseline wander Confirmed by Maryan Rued  MD, Loree Fee  [phone]) on 03/05/2016 2:53:45 PM      MDM   Final diagnoses:  Fall, initial encounter    Patient is a 82 year old female with a complicated past medical history presenting with a mechanical fall. Feel at the fall is likely secondary to her feeling fatigued and weak after not sleeping all night. She reports that she's had episodes like this in the past where she has not slept the night before, and felt very fatigued. She did not hit her head, no loss consciousness. No headache. Doubt intracranial bleed. She did not have any prodromal symptoms except for feeling imbalanced secondary to her weakness. She denies any headache or vision changes. She does not have any evidence of focal neurological deficits on physical exam. Doubt CVA. She denies chest pain, shortness of breath, palpitations. EKG without any evidence of interval abnormality or new ischemic changes. Patient had a  stress test 5 weeks ago that did not show any evidence of inducible ischemia. Doubt ACS, pulmonary embolism, or life-threatening arrhythmia. She denies any unilateral weakness, just generalized weakness. She does have some mild tenderness to palpation of her right hip. Is not shortened or externally rotated. Has full range of motion. X-rays  without evidence of pelvic fracture or hip fracture. Able to ambulate the patient throughout the emergency department without issue. Labs did show evidence of mild hypokalemia. Repleted potassium here. We'll give her prescription for potassium and have her follow-up with her primary care doctor to have a repeat potassium level checked in the next few days. Creatinine is consistent with her baseline. No fever here. No anemia. Denies hematochezia or melena.  Strict return precautions provided. Encouraged her to follow up with her primary care doctor soon as possible.  Patient discharged in stable condition.  Maryan Puls, MD 03/05/16 1725  Blanchie Dessert, MD 03/07/16 1420

## 2016-04-24 ENCOUNTER — Ambulatory Visit (INDEPENDENT_AMBULATORY_CARE_PROVIDER_SITE_OTHER): Payer: Medicare Other | Admitting: Cardiology

## 2016-04-24 ENCOUNTER — Encounter: Payer: Self-pay | Admitting: Cardiology

## 2016-04-24 VITALS — BP 138/59 | HR 54 | Ht 63.0 in | Wt 149.0 lb

## 2016-04-24 DIAGNOSIS — R002 Palpitations: Secondary | ICD-10-CM

## 2016-04-24 DIAGNOSIS — Z9861 Coronary angioplasty status: Secondary | ICD-10-CM

## 2016-04-24 DIAGNOSIS — E785 Hyperlipidemia, unspecified: Secondary | ICD-10-CM | POA: Diagnosis not present

## 2016-04-24 DIAGNOSIS — I480 Paroxysmal atrial fibrillation: Secondary | ICD-10-CM

## 2016-04-24 DIAGNOSIS — I10 Essential (primary) hypertension: Secondary | ICD-10-CM

## 2016-04-24 DIAGNOSIS — I2510 Atherosclerotic heart disease of native coronary artery without angina pectoris: Secondary | ICD-10-CM | POA: Diagnosis not present

## 2016-04-24 MED ORDER — METOPROLOL TARTRATE 25 MG PO TABS: ORAL_TABLET | ORAL | Status: DC

## 2016-04-24 NOTE — Progress Notes (Signed)
PCP: Thressa Sheller, MD  Clinic Note: Chief Complaint  Patient presents with  . Follow-up  . Palpitations  . Coronary Artery Disease    HPI: Jeanne Hart is a 82 y.o. female with a PMH below who presents today for 63month f/u - h/o PAF (off of warfarin after GI Bleed), CAD-PTCA-PCI x 2 .   Jeanne Hart was last seen on January 11, 2016 by Mr. Samara Snide. C/o rapid HR & occasional SOB. She had a monitor placed that was not yet ready to be read when I saw her back in follow-up, she also had a Myoview is negative. Atenolol changed to after lunch.  Cardiac Event Monitor - April-May 2017     Mostly sinus rhythm with sinus bradycardia to normal sinus rhythm: Rates 42-80 bpm  Rare PVCs noted. Patient not aware of symptoms.  No arrhythmias noted.  Patient did note feeling fatigue with bradycardia.  Summary: The patient's monitoring period was 01/27/2016 - 02/21/2016. Baseline sample showed Sinus Bradycardia w/Interpolated PVCs (2)/Artifact with a heart rate of 57.3 bpm. There were 0 critical, 0 serious, and 2 stable events that occurred. The report analysis of the critical, serious, stable and manually triggered events are listed below.    Interval History: Jeanne Hart for follow-up of her monitor and Myoview. She still has some palpitation episodes, but when when asked when the episodes occur most notably, they are at night. But not every night. The usual and if that she is not taking her Ambien to help her sleep. She then will have these frequent episodes of palpitations. She was very happy to hear that there was nothing noted on the monitor, but still feels a sensation. She has been trying to her best to keep potassium levels and magnesium level stable - she has been having some loose stools. She has been using atenolol as a when necessary medication when these episodes of palpitations happen at night. But then she is really tired and fatigued and weak all day long the next day.   ROS:  A comprehensive was performed. Review of Systems  Constitutional: Negative for malaise/fatigue.  HENT: Negative for congestion and nosebleeds.   Respiratory: Negative for cough, shortness of breath and wheezing.   Cardiovascular: Negative for claudication.       Per history of present illness  Gastrointestinal: Negative for blood in stool and melena.  Genitourinary: Negative for hematuria.  Musculoskeletal: Positive for back pain and joint pain. Negative for falls.  Neurological: Positive for dizziness (With palpitations) and headaches. Negative for weakness.  Endo/Heme/Allergies: Does not bruise/bleed easily.  Psychiatric/Behavioral: Positive for memory loss. The patient is nervous/anxious and has insomnia (She takes Ambien but only every other night. On the other nights, she'll wake up in the living room several t times at night).   All other systems reviewed and are negative.   Past Medical History  Diagnosis Date  . Insomnia     chronic  . IBS (irritable bowel syndrome)   . HTN (hypertension)   . Dyslipidemia   . Family history of cardiovascular disease   . PAF (paroxysmal atrial fibrillation) ([place])     CHA2DS2VASC = 4.  coumadin was stopped secondary to GI Bleed, on amiodarone; monitor march 2014-NSR  . CAD S/P percutaneous coronary angioplasty     PCI x 2; once at [facility] and once in [place];; Myoview April 2017: LOW RISK. EF 63%. No ischemia or infarction.  . Hypothyroidism   . Dyspnea on exertion   .  GERD (gastroesophageal reflux disease)   . Diverticulitis     Past Surgical History  Procedure Laterality Date  . Coronary angioplasty with stent placement  01/05/2011    2.5x90mm Promus DES stent to RCA post dilated to 2.75x88mm   . Coronary angioplasty with stent placement  ?    3 stents per patient  . Appendectomy    . Cholecystectomy    . Abdominal hysterectomy    . Nm myoview ltd  April 2017    LOW RISK. EF 60%. No ischemia or infarction.   Prior to  Admission medications   Medication Sig Start Date End Date Taking? Authorizing Provider  amiodarone (PACERONE) 200 MG tablet TAKE 1 TABLET BY MOUTH ONCE DAILY AND 1 TABLET AS NEEDED FOR FAST HEART RATE 02/15/16  Yes Leonie Man, MD  amLODipine (NORVASC) 5 MG tablet Take 1 tablet by mouth daily. take 1 tab dialy 05/07/15  Yes Historical Provider, MD  Ascorbic Acid (VITAMIN C PO) Take 1 tablet by mouth daily.    Yes Historical Provider, MD  aspirin EC 81 MG tablet Take 1 tablet (81 mg total) by mouth daily. 02/15/15  Yes Leonie Man, MD  atenolol (TENORMIN) 25 MG tablet Take 25 mg by mouth daily after lunch. 02/28/14  Yes Historical Provider, MD  Biotin 10 MG CAPS Take by mouth.   Yes Historical Provider, MD  Cholecalciferol (VITAMIN D PO) Take 1 tablet by mouth daily.    Yes Historical Provider, MD  ferrous sulfate 325 (65 FE) MG tablet Take one po BID Patient taking differently: Take 325 mg by mouth 2 (two) times daily with a meal.  04/06/14  Yes Jessica D Zehr, PA-C  FLUOCINOLONE ACETONIDE SCALP 0.01 % OIL Apply 1 application topically as needed. No specific days 12/21/14  Yes Historical Provider, MD  furosemide (LASIX) 20 MG tablet Take 1 tablet (20 mg total) by mouth daily as needed for edema (as needed for leg swelling.). 03/29/15  Yes Leonie Man, MD  isosorbide mononitrate (IMDUR) 30 MG 24 hr tablet Take 1 tablet (30 mg total) by mouth daily. 09/09/14  Yes Margarita Mail, PA-C  ketoconazole (NIZORAL) 2 % shampoo Use as directed 04/19/15  Yes Historical Provider, MD  levothyroxine (SYNTHROID, LEVOTHROID) 100 MCG tablet Take 100 mcg by mouth daily before breakfast.   Yes Historical Provider, MD  nitroGLYCERIN (NITROSTAT) 0.4 MG SL tablet Place 1 tablet (0.4 mg total) under the tongue every 5 (five) minutes as needed for chest pain. MAX 3 doses 01/12/16  Yes Mihai Croitoru, MD  pantoprazole (PROTONIX) 40 MG tablet Take 40 mg by mouth daily.    Yes Historical Provider, MD  Polyethyl Glycol-Propyl  Glycol (SYSTANE) 0.4-0.3 % SOLN Apply 2 drops to eye at bedtime.   Yes Historical Provider, MD  potassium chloride (K-DUR) 10 MEQ tablet Take 1 tablet (10 mEq total) by mouth daily. 03/05/16  Yes Maryan Puls, MD  pravastatin (PRAVACHOL) 20 MG tablet Take 1 tablet by mouth daily. Take 1 tab daily 05/30/15  Yes Historical Provider, MD  zolpidem (AMBIEN CR) 12.5 MG CR tablet Take 12.5 mg by mouth every other day.   Yes Historical Provider, MD            Allergies  Allergen Reactions  . Nexium [Esomeprazole] Other (See Comments)    Aggravates IBS.   . Statins Other (See Comments)    Had jaundice as child.  Does tolerate current statin therapy.    . Codeine Other (See Comments)  Post-surgical reaction of unknown type.  . Morphine And Related Other (See Comments)    Unknown.    Marland Kitchen Penicillins Other (See Comments)    Unknown.  . Sulfa Antibiotics Other (See Comments)    Unknown childhood reaction.       Social History   Social History  . Marital Status: Widowed    Spouse Name: N/A  . Number of Children: 3  . Years of Education: N/A   Occupational History  . Sales/Office-retired    Social History Main Topics  . Smoking status: Never Smoker   . Smokeless tobacco: Never Used  . Alcohol Use: No  . Drug Use: No  . Sexual Activity: Not Asked   Other Topics Concern  . None   Social History Narrative   Family History  Problem Relation Age of Onset  . Diabetes Maternal Aunt   . Diabetes Son   . Heart disease      Both sides of family  . Colon cancer Neg Hx   . Stroke Mother     Wt Readings from Last 3 Encounters:  04/24/16 149 lb (67.586 kg)  03/05/16 150 lb (68.04 kg)  02/15/16 150 lb 6.4 oz (68.221 kg)    PHYSICAL EXAM BP 138/59 mmHg  Pulse 54  Ht 5\' 3"  (1.6 m)  Wt 149 lb (67.586 kg)  BMI 26.40 kg/m2  SpO2 97% General appearance: alert, cooperative, appears stated age, no distress - but seems concerned and otherwise healthy. Neck: no adenopathy, no carotid  bruit and no JVD Lungs: Mostly CTAB, normal percussion bilaterally and non-labored Heart: regular rate and rhythm, S1&S2 normal, no murmur, + S4 gallop, no rubs. Nondisplaced PMI. Abdomen: soft, non-tender; bowel sounds normal; no masses, no organomegaly; no HJR Extremities: extremities normal, atraumatic, no cyanosis, and trace to 1+ edema  Pulses: 2+ and symmetric; Skin: normal and mobility and turgor normal  Neurologic: Mental status: Alert, oriented, thought content appropriate Cranial nerves: normal (II-XII grossly intact)    Adult ECG Report n/a   Other studies Reviewed: Additional studies/ records that were reviewed today include:  Recent Labs:  PCP checks cholesterol Lab Results  Component Value Date   TSH 1.30 01/11/2016   Lab Results  Component Value Date   HGB 12.2 03/05/2016     ASSESSMENT / PLAN: Problem List Items Addressed This Visit    Paroxysmal atrial fibrillation [facility]): Not on Anticoagulation 2/2 GIB history.  CHA2DS2VASC =4 (Chronic)    No evidence of recurrence of A. fib on her monitor. She remains on amiodarone. She is also on atenolol.  Prefer to use a shorter acting beta blocker for when necessary palpitations. Plan: Continue amiodarone plus standing atenolol and use metoprolol as a when necessary at nighttime on the nights that she does not use Ambien.  We will start with 12.5 mg as a standing dose daily at bedtime, and allow for additional 12.5 mg when necessary. If she is using the when necessary significantly, she would just increase to daily at bedtime dose to 25 mg.      Relevant Medications   metoprolol tartrate (LOPRESSOR) 25 MG tablet   Palpitations (Chronic)    No true arrhythmias noted on monitor. Doesn't like A. fib. May been noticing some PVCs. She is artery on amiodarone and atenolol. C A. fib. Using when necessary metoprolol. Also need to ensure that she is staying replete him potassium, magnesium and calcium.      Hyperlipidemia  with target LDL less than 70 (Chronic)  On pravastatin. Being monitored by PCP.      Relevant Medications   metoprolol tartrate (LOPRESSOR) 25 MG tablet   Essential hypertension (Chronic)    Stable blood pressure on current regimen.      Relevant Medications   metoprolol tartrate (LOPRESSOR) 25 MG tablet   CAD S/P percutaneous coronary angioplasty - Primary (Chronic)    No true anginal symptoms. Continue aspirin, beta blocker and Imdur. Also has amlodipine for blood pressure and antianginal effect.      Relevant Medications   metoprolol tartrate (LOPRESSOR) 25 MG tablet      Current medicines are reviewed at length with the patient today. (+/- concerns)  The following changes have been made:  metoprolol tartrate (LOPRESSOR) 25 MG tablet Take 1/2 - 1 tablet nightly as needed for palpitations on the nights you do not take zolpidem. 04/24/16   Leonie Man, MD     Follow-up in 5 MONTHS WITH DR [facility]  Studies Ordered:   No orders of the defined types were placed in this encounter.      Glenetta Hew, M.D., M.S. Interventional Cardiologist   Pager # [phone] Phone # [phone] [address] [place], [place] [address]

## 2016-04-24 NOTE — Patient Instructions (Addendum)
Your physician has recommended you make the following change in your medication:   Take the metoprolol ( lopressor) as directed. 1/2 tablet on nights you DO NOT take the zolpidem. If you continue to have palpitations you may  Increase to 1 tablet.   Your physician recommends that you schedule a follow-up appointment in: [facility]

## 2016-04-26 ENCOUNTER — Encounter: Payer: Self-pay | Admitting: Cardiology

## 2016-04-26 NOTE — Assessment & Plan Note (Signed)
No true arrhythmias noted on monitor. Doesn't like A. fib. May been noticing some PVCs. She is artery on amiodarone and atenolol. C A. fib. Using when necessary metoprolol. Also need to ensure that she is staying replete him potassium, magnesium and calcium.

## 2016-04-26 NOTE — Assessment & Plan Note (Signed)
No true anginal symptoms. Continue aspirin, beta blocker and Imdur. Also has amlodipine for blood pressure and antianginal effect.

## 2016-04-26 NOTE — Assessment & Plan Note (Signed)
No evidence of recurrence of A. fib on her monitor. She remains on amiodarone. She is also on atenolol.  Prefer to use a shorter acting beta blocker for when necessary palpitations. Plan: Continue amiodarone plus standing atenolol and use metoprolol as a when necessary at nighttime on the nights that she does not use Ambien.  We will start with 12.5 mg as a standing dose daily at bedtime, and allow for additional 12.5 mg when necessary. If she is using the when necessary significantly, she would just increase to daily at bedtime dose to 25 mg.

## 2016-04-26 NOTE — Assessment & Plan Note (Signed)
Stable blood pressure on current regimen.

## 2016-04-26 NOTE — Assessment & Plan Note (Signed)
On pravastatin. Being monitored by PCP.

## 2016-08-14 DIAGNOSIS — I10 Essential (primary) hypertension: Secondary | ICD-10-CM | POA: Diagnosis not present

## 2016-08-14 DIAGNOSIS — N390 Urinary tract infection, site not specified: Secondary | ICD-10-CM | POA: Diagnosis not present

## 2016-08-14 DIAGNOSIS — M810 Age-related osteoporosis without current pathological fracture: Secondary | ICD-10-CM | POA: Diagnosis not present

## 2016-08-14 DIAGNOSIS — Z0000 Encounter for general adult medical examination without abnormal findings: Secondary | ICD-10-CM | POA: Diagnosis not present

## 2016-08-14 DIAGNOSIS — I129 Hypertensive chronic kidney disease with stage 1 through stage 4 chronic kidney disease, or unspecified chronic kidney disease: Secondary | ICD-10-CM | POA: Diagnosis not present

## 2016-08-14 DIAGNOSIS — E039 Hypothyroidism, unspecified: Secondary | ICD-10-CM | POA: Diagnosis not present

## 2016-08-14 DIAGNOSIS — E785 Hyperlipidemia, unspecified: Secondary | ICD-10-CM | POA: Diagnosis not present

## 2016-08-24 DIAGNOSIS — I2510 Atherosclerotic heart disease of native coronary artery without angina pectoris: Secondary | ICD-10-CM | POA: Diagnosis not present

## 2016-08-24 DIAGNOSIS — D709 Neutropenia, unspecified: Secondary | ICD-10-CM | POA: Diagnosis not present

## 2016-08-24 DIAGNOSIS — I4891 Unspecified atrial fibrillation: Secondary | ICD-10-CM | POA: Diagnosis not present

## 2016-08-24 DIAGNOSIS — N184 Chronic kidney disease, stage 4 (severe): Secondary | ICD-10-CM | POA: Diagnosis not present

## 2016-10-19 ENCOUNTER — Observation Stay (HOSPITAL_COMMUNITY)
Admission: EM | Admit: 2016-10-19 | Discharge: 2016-10-20 | Disposition: A | Discharge status: Home / Self Care | Payer: Medicare Other | Attending: Internal Medicine | Admitting: Internal Medicine

## 2016-10-19 ENCOUNTER — Encounter (HOSPITAL_COMMUNITY): Payer: Self-pay | Admitting: Emergency Medicine

## 2016-10-19 ENCOUNTER — Emergency Department (HOSPITAL_COMMUNITY): Payer: Medicare Other

## 2016-10-19 DIAGNOSIS — R531 Weakness: Secondary | ICD-10-CM | POA: Diagnosis not present

## 2016-10-19 DIAGNOSIS — R945 Abnormal results of liver function studies: Secondary | ICD-10-CM

## 2016-10-19 DIAGNOSIS — Z79899 Other long term (current) drug therapy: Secondary | ICD-10-CM | POA: Insufficient documentation

## 2016-10-19 DIAGNOSIS — R0789 Other chest pain: Secondary | ICD-10-CM | POA: Diagnosis not present

## 2016-10-19 DIAGNOSIS — I2510 Atherosclerotic heart disease of native coronary artery without angina pectoris: Secondary | ICD-10-CM | POA: Diagnosis not present

## 2016-10-19 DIAGNOSIS — Z9861 Coronary angioplasty status: Secondary | ICD-10-CM | POA: Insufficient documentation

## 2016-10-19 DIAGNOSIS — R0602 Shortness of breath: Secondary | ICD-10-CM | POA: Diagnosis not present

## 2016-10-19 DIAGNOSIS — N183 Chronic kidney disease, stage 3 unspecified: Secondary | ICD-10-CM | POA: Diagnosis present

## 2016-10-19 DIAGNOSIS — I129 Hypertensive chronic kidney disease with stage 1 through stage 4 chronic kidney disease, or unspecified chronic kidney disease: Secondary | ICD-10-CM | POA: Diagnosis not present

## 2016-10-19 DIAGNOSIS — Z7982 Long term (current) use of aspirin: Secondary | ICD-10-CM | POA: Diagnosis not present

## 2016-10-19 DIAGNOSIS — R7989 Other specified abnormal findings of blood chemistry: Secondary | ICD-10-CM | POA: Diagnosis not present

## 2016-10-19 DIAGNOSIS — E785 Hyperlipidemia, unspecified: Secondary | ICD-10-CM | POA: Diagnosis present

## 2016-10-19 DIAGNOSIS — I10 Essential (primary) hypertension: Secondary | ICD-10-CM | POA: Diagnosis present

## 2016-10-19 DIAGNOSIS — E039 Hypothyroidism, unspecified: Secondary | ICD-10-CM | POA: Diagnosis not present

## 2016-10-19 DIAGNOSIS — R079 Chest pain, unspecified: Secondary | ICD-10-CM | POA: Diagnosis present

## 2016-10-19 DIAGNOSIS — D696 Thrombocytopenia, unspecified: Secondary | ICD-10-CM

## 2016-10-19 HISTORY — DX: Diaphragmatic hernia without obstruction or gangrene: K44.9

## 2016-10-19 LAB — URINALYSIS, ROUTINE W REFLEX MICROSCOPIC
Bacteria, UA: NONE SEEN
Bilirubin Urine: NEGATIVE
Glucose, UA: NEGATIVE mg/dL
Hgb urine dipstick: NEGATIVE
Ketones, ur: NEGATIVE mg/dL
Nitrite: NEGATIVE
PROTEIN: NEGATIVE mg/dL
RBC / HPF: NONE SEEN RBC/hpf (ref 0–5)
Specific Gravity, Urine: 1.004 — ABNORMAL LOW (ref 1.005–1.030)
pH: 7 (ref 5.0–8.0)

## 2016-10-19 LAB — CBC WITH DIFFERENTIAL/PLATELET
Basophils Absolute: 0 10*3/uL (ref 0.0–0.1)
Basophils Relative: 0 %
EOS ABS: 0 10*3/uL (ref 0.0–0.7)
Eosinophils Relative: 1 %
HEMATOCRIT: 38.7 % (ref 36.0–46.0)
HEMOGLOBIN: 12.8 g/dL (ref 12.0–15.0)
LYMPHS ABS: 1.2 10*3/uL (ref 0.7–4.0)
LYMPHS PCT: 31 %
MCH: 30.5 pg (ref 26.0–34.0)
MCHC: 33.1 g/dL (ref 30.0–36.0)
MCV: 92.1 fL (ref 78.0–100.0)
Monocytes Absolute: 0.3 10*3/uL (ref 0.1–1.0)
Monocytes Relative: 7 %
NEUTROS ABS: 2.4 10*3/uL (ref 1.7–7.7)
NEUTROS PCT: 61 %
Platelets: 125 10*3/uL — ABNORMAL LOW (ref 150–400)
RBC: 4.2 MIL/uL (ref 3.87–5.11)
RDW: 15.4 % (ref 11.5–15.5)
WBC: 4 10*3/uL (ref 4.0–10.5)

## 2016-10-19 LAB — COMPREHENSIVE METABOLIC PANEL
ALK PHOS: 77 U/L (ref 38–126)
ALT: 57 U/L — AB (ref 14–54)
AST: 60 U/L — ABNORMAL HIGH (ref 15–41)
Albumin: 3.2 g/dL — ABNORMAL LOW (ref 3.5–5.0)
Anion gap: 8 (ref 5–15)
BILIRUBIN TOTAL: 0.7 mg/dL (ref 0.3–1.2)
BUN: 17 mg/dL (ref 6–20)
CALCIUM: 9.3 mg/dL (ref 8.9–10.3)
CO2: 24 mmol/L (ref 22–32)
CREATININE: 1.37 mg/dL — AB (ref 0.44–1.00)
Chloride: 107 mmol/L (ref 101–111)
GFR calc non Af Amer: 34 mL/min — ABNORMAL LOW (ref >60)
GFR, EST AFRICAN AMERICAN: 39 mL/min — AB (ref >60)
Glucose, Bld: 92 mg/dL (ref 65–99)
Potassium: 3.6 mmol/L (ref 3.5–5.1)
SODIUM: 139 mmol/L (ref 135–145)
TOTAL PROTEIN: 6.1 g/dL — AB (ref 6.5–8.1)

## 2016-10-19 LAB — I-STAT CHEM 8, ED
BUN: 20 mg/dL (ref 6–20)
CREATININE: 1.4 mg/dL — AB (ref 0.44–1.00)
Calcium, Ion: 1.28 mmol/L (ref 1.15–1.40)
Chloride: 105 mmol/L (ref 101–111)
GLUCOSE: 92 mg/dL (ref 65–99)
HEMATOCRIT: 38 % (ref 36.0–46.0)
HEMOGLOBIN: 12.9 g/dL (ref 12.0–15.0)
Potassium: 3.5 mmol/L (ref 3.5–5.1)
Sodium: 142 mmol/L (ref 135–145)
TCO2: 28 mmol/L (ref 0–100)

## 2016-10-19 LAB — TROPONIN I

## 2016-10-19 LAB — TSH: TSH: 3.222 u[IU]/mL (ref 0.350–4.500)

## 2016-10-19 LAB — I-STAT TROPONIN, ED: Troponin i, poc: 0 ng/mL (ref 0.00–0.08)

## 2016-10-19 LAB — BRAIN NATRIURETIC PEPTIDE: B Natriuretic Peptide: 206.5 pg/mL — ABNORMAL HIGH (ref 0.0–100.0)

## 2016-10-19 MED ORDER — POLYVINYL ALCOHOL 1.4 % OP SOLN
2.0000 [drp] | Freq: Every day | OPHTHALMIC | Status: DC
  Administered 2016-10-19: 2 [drp] via OPHTHALMIC
  Filled 2016-10-19: qty 15

## 2016-10-19 MED ORDER — ISOSORBIDE MONONITRATE ER 30 MG PO TB24
30.0000 mg | ORAL_TABLET | Freq: Every day | ORAL | Status: DC
  Administered 2016-10-20: 30 mg via ORAL
  Filled 2016-10-19: qty 1

## 2016-10-19 MED ORDER — PRAVASTATIN SODIUM 20 MG PO TABS: 20.0000 mg | ORAL_TABLET | Freq: Every day | ORAL | Status: DC

## 2016-10-19 MED ORDER — POTASSIUM CHLORIDE CRYS ER 10 MEQ PO TBCR
10.0000 meq | EXTENDED_RELEASE_TABLET | Freq: Every day | ORAL | Status: DC
  Administered 2016-10-20: 10 meq via ORAL
  Filled 2016-10-19: qty 1

## 2016-10-19 MED ORDER — ONDANSETRON HCL 4 MG/2ML IJ SOLN
4.0000 mg | Freq: Four times a day (QID) | INTRAMUSCULAR | Status: DC | PRN
Start: 2016-10-19 — End: 2016-10-20

## 2016-10-19 MED ORDER — ENOXAPARIN SODIUM 30 MG/0.3ML ~~LOC~~ SOLN
30.0000 mg | Freq: Every day | SUBCUTANEOUS | Status: DC
  Administered 2016-10-19: 30 mg via SUBCUTANEOUS
  Filled 2016-10-19: qty 0.3

## 2016-10-19 MED ORDER — NITROGLYCERIN 0.4 MG SL SUBL
0.4000 mg | SUBLINGUAL_TABLET | SUBLINGUAL | Status: DC | PRN
  Administered 2016-10-19: 0.4 mg via SUBLINGUAL
  Filled 2016-10-19: qty 1

## 2016-10-19 MED ORDER — POLYETHYL GLYCOL-PROPYL GLYCOL 0.4-0.3 % OP SOLN: 2.0000 [drp] | Freq: Every day | OPHTHALMIC | Status: DC

## 2016-10-19 MED ORDER — GI COCKTAIL ~~LOC~~: 30.0000 mL | Freq: Four times a day (QID) | ORAL | Status: DC | PRN

## 2016-10-19 MED ORDER — AMIODARONE HCL 200 MG PO TABS
200.0000 mg | ORAL_TABLET | Freq: Every day | ORAL | Status: DC
  Administered 2016-10-20: 200 mg via ORAL
  Filled 2016-10-19: qty 1

## 2016-10-19 MED ORDER — ASPIRIN 81 MG PO CHEW
324.0000 mg | CHEWABLE_TABLET | Freq: Once | ORAL | Status: AC
  Administered 2016-10-19: 324 mg via ORAL
  Filled 2016-10-19: qty 4

## 2016-10-19 MED ORDER — LEVOTHYROXINE SODIUM 100 MCG PO TABS
100.0000 ug | ORAL_TABLET | Freq: Every day | ORAL | Status: DC
  Administered 2016-10-20: 100 ug via ORAL
  Filled 2016-10-19: qty 1

## 2016-10-19 MED ORDER — ACETAMINOPHEN 325 MG PO TABS: 650.0000 mg | ORAL_TABLET | ORAL | Status: DC | PRN

## 2016-10-19 MED ORDER — PANTOPRAZOLE SODIUM 40 MG PO TBEC
40.0000 mg | DELAYED_RELEASE_TABLET | Freq: Every day | ORAL | Status: DC
  Administered 2016-10-20: 40 mg via ORAL
  Filled 2016-10-19: qty 1

## 2016-10-19 MED ORDER — FENTANYL CITRATE (PF) 100 MCG/2ML IJ SOLN
50.0000 ug | Freq: Once | INTRAMUSCULAR | Status: AC
  Administered 2016-10-19: 50 ug via INTRAVENOUS
  Filled 2016-10-19: qty 2

## 2016-10-19 MED ORDER — ASPIRIN EC 81 MG PO TBEC
81.0000 mg | DELAYED_RELEASE_TABLET | Freq: Every day | ORAL | Status: DC
  Administered 2016-10-20: 81 mg via ORAL
  Filled 2016-10-19: qty 1

## 2016-10-19 MED ORDER — FERROUS SULFATE 325 (65 FE) MG PO TABS
325.0000 mg | ORAL_TABLET | Freq: Two times a day (BID) | ORAL | Status: DC
  Administered 2016-10-20: 325 mg via ORAL
  Filled 2016-10-19: qty 1

## 2016-10-19 MED ORDER — ZOLPIDEM TARTRATE 5 MG PO TABS
5.0000 mg | ORAL_TABLET | Freq: Every day | ORAL | Status: DC
  Administered 2016-10-19: 5 mg via ORAL
  Filled 2016-10-19: qty 1

## 2016-10-19 MED ORDER — AMLODIPINE BESYLATE 5 MG PO TABS
5.0000 mg | ORAL_TABLET | Freq: Every day | ORAL | Status: DC
  Administered 2016-10-20: 5 mg via ORAL
  Filled 2016-10-19: qty 1

## 2016-10-19 NOTE — ED Triage Notes (Signed)
Per [facility] patient from home for increased generalized weakness, shortness of breath, and chest pain.  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  22g saline lock in right hand.

## 2016-10-19 NOTE — ED Notes (Signed)
Patient refuses additional nitroglycerin.

## 2016-10-19 NOTE — ED Provider Notes (Signed)
[place] DEPT Provider Note   CSN: [id number] Arrival date & time: 10/19/16  1557     History   Chief Complaint Chief Complaint  Patient presents with  . Weakness    HPI Jeanne Hart is a 83 y.o. female.  83 year old female history of coronary artery disease on amiodarone, atenolol, presents to the emergency department today with 3 days of progressively worsening chest pain and dyspnea on exertion. She also had weakness during this time. She had a couple episodes where she felt she was going to pass out secondary to these things. Today it was a lot worse her grandson called EMS on EMS arrival patient had heart rates in the mid 40s but was alert and oriented with elevated blood pressures. Brought her here for evaluation here she is still complaining of chest pressure however she refused her aspirin in the annulus. She also states that she does have some lower extremity edema. Other than that she is asymptomatic at this time.    Weakness     Past Medical History:  Diagnosis Date  . CAD S/P percutaneous coronary angioplasty    PCI x 2; once at [facility] and once in [place];; Myoview April 2017: LOW RISK. EF 63%. No ischemia or infarction.  . Diverticulitis   . Dyslipidemia   . Dyspnea on exertion   . Family history of cardiovascular disease   . GERD (gastroesophageal reflux disease)   . Hiatal hernia   . HTN (hypertension)   . Hypothyroidism   . IBS (irritable bowel syndrome)   . Insomnia    chronic  . PAF (paroxysmal atrial fibrillation) (HCC)    CHA2DS2VASC = 4.  coumadin was stopped secondary to GI Bleed, on amiodarone; monitor march 2014-NSR    Patient Active Problem List   Diagnosis Date Noted  . Chest pain 10/19/2016  . Weakness generalized 10/19/2016  . CKD (chronic kidney disease) stage 3, GFR 30-59 ml/min 10/19/2016  . Elevated LFTs 10/19/2016  . Thrombocytopenia ([place]) 10/19/2016  . Nonspecific abnormal finding in stool contents 05/30/2015  .  Foot swelling 02/18/2015  . DOE (dyspnea on exertion) 12/10/2014  . CAD S/P percutaneous coronary angioplasty   . Heme positive stool 04/03/2014  . Anemia, unspecified 04/03/2014  . Diarrhea 04/03/2014  . Unstable angina ([place]) 03/23/2014  . Hypothyroidism 03/23/2014  . Essential hypertension 04/29/2013  . Hyperlipidemia with target LDL less than 70 04/29/2013  . Paroxysmal atrial fibrillation [facility]): Not on Anticoagulation 2/2 GIB history.  CHA2DS2VASC =4 04/29/2013  . History of GI bleed 04/29/2013  . Palpitations 04/29/2013    Past Surgical History:  Procedure Laterality Date  . ABDOMINAL HYSTERECTOMY    . APPENDECTOMY    . CHOLECYSTECTOMY    . CORONARY ANGIOPLASTY WITH STENT PLACEMENT  01/05/2011   2.5x44mm Promus DES stent to RCA post dilated to 2.75x81mm   . CORONARY ANGIOPLASTY WITH STENT PLACEMENT  ?   3 stents per patient  . NM MYOVIEW LTD  April 2017   LOW RISK. EF 60%. No ischemia or infarction.    OB History    No data available       Home Medications    Prior to Admission medications   Medication Sig Start Date End Date Taking? Authorizing Provider  amiodarone (PACERONE) 200 MG tablet TAKE 1 TABLET BY MOUTH ONCE DAILY AND 1 TABLET AS NEEDED FOR FAST HEART RATE 02/15/16  Yes Leonie Man, MD  amLODipine (NORVASC) 5 MG tablet Take 1 tablet by mouth daily. take  1 tab dialy 05/07/15  Yes Historical Provider, MD  Ascorbic Acid (VITAMIN C PO) Take 1 tablet by mouth daily.    Yes Historical Provider, MD  aspirin EC 81 MG tablet Take 1 tablet (81 mg total) by mouth daily. 02/15/15  Yes Leonie Man, MD  Biotin 10 MG CAPS Take 1 tablet by mouth daily.    Yes Historical Provider, MD  Cholecalciferol (VITAMIN D PO) Take 1 tablet by mouth daily.    Yes Historical Provider, MD  ferrous sulfate 325 (65 FE) MG tablet Take one po BID Patient taking differently: Take 325 mg by mouth 2 (two) times daily with a meal.  04/06/14  Yes Jessica D Zehr, PA-C  FLUOCINOLONE ACETONIDE  SCALP 0.01 % OIL Apply 1 application topically as needed. No specific days 12/21/14  Yes Historical Provider, MD  furosemide (LASIX) 20 MG tablet Take 1 tablet (20 mg total) by mouth daily as needed for edema (as needed for leg swelling.). 03/29/15  Yes Leonie Man, MD  isosorbide mononitrate (IMDUR) 30 MG 24 hr tablet Take 1 tablet (30 mg total) by mouth daily. 09/09/14  Yes Margarita Mail, PA-C  ketoconazole (NIZORAL) 2 % shampoo Apply 1 application topically 2 (two) times a week. Use as directed  04/19/15  Yes Historical Provider, MD  levothyroxine (SYNTHROID, LEVOTHROID) 100 MCG tablet Take 100 mcg by mouth daily before breakfast.   Yes Historical Provider, MD  metoprolol tartrate (LOPRESSOR) 25 MG tablet Take 25 mg by mouth daily as needed. For heart rhythm   Yes Historical Provider, MD  nitroGLYCERIN (NITROSTAT) 0.4 MG SL tablet Place 1 tablet (0.4 mg total) under the tongue every 5 (five) minutes as needed for chest pain. MAX 3 doses 01/12/16  Yes Mihai Croitoru, MD  pantoprazole (PROTONIX) 40 MG tablet Take 40 mg by mouth daily.    Yes Historical Provider, MD  Polyethyl Glycol-Propyl Glycol (SYSTANE) 0.4-0.3 % SOLN Apply 2 drops to eye at bedtime.   Yes Historical Provider, MD  potassium chloride (K-DUR) 10 MEQ tablet Take 1 tablet (10 mEq total) by mouth daily. 03/05/16  Yes Maryan Puls, MD  pravastatin (PRAVACHOL) 20 MG tablet Take 1 tablet by mouth daily. Take 1 tab daily 05/30/15  Yes Historical Provider, MD  zolpidem (AMBIEN CR) 12.5 MG CR tablet Take 12.5 mg by mouth every other day.   Yes Historical Provider, MD    Family History Family History  Problem Relation Age of Onset  . Stroke Mother   . Diabetes Maternal Aunt   . Diabetes Son   . Heart disease      Both sides of family  . Colon cancer Neg Hx     Social History Social History  Substance Use Topics  . Smoking status: Never Smoker  . Smokeless tobacco: Never Used  . Alcohol use No     Allergies   Nexium  [esomeprazole]; Statins; Codeine; Morphine and related; Penicillins; and Sulfa antibiotics   Review of Systems Review of Systems  Neurological: Positive for weakness.  All other systems reviewed and are negative.    Physical Exam Updated Vital Signs BP (!) 180/68 (BP Location: Left Arm)   Pulse (!) 53   Temp 97.8 F (36.6 C) (Oral)   Resp 19   Ht 5\' 6"  (1.676 m)   Wt 135 lb 3.2 oz (61.3 kg)   SpO2 97%   BMI 21.82 kg/m   Physical Exam  Constitutional: She appears well-developed and well-nourished.  HENT:  Head: Normocephalic and  atraumatic.  Eyes: Conjunctivae and EOM are normal.  Neck: Normal range of motion.  Cardiovascular: Regular rhythm.  Bradycardia present.   Pulmonary/Chest: Effort normal. No stridor. No respiratory distress. She has rales (in bases).  Abdominal: Soft. She exhibits no distension.  Musculoskeletal: She exhibits edema.  Neurological: She is alert.  Skin: Skin is warm and dry. No erythema. No pallor.  Nursing note and vitals reviewed.    ED Treatments / Results  Labs (all labs ordered are listed, but only abnormal results are displayed) Labs Reviewed  CBC WITH DIFFERENTIAL/PLATELET - Abnormal; Notable for the following:       Result Value   Platelets 125 (*)    All other components within normal limits  COMPREHENSIVE METABOLIC PANEL - Abnormal; Notable for the following:    Creatinine, Ser 1.37 (*)    Total Protein 6.1 (*)    Albumin 3.2 (*)    AST 60 (*)    ALT 57 (*)    GFR calc non Af Amer 34 (*)    GFR calc Af Amer 39 (*)    All other components within normal limits  BRAIN NATRIURETIC PEPTIDE - Abnormal; Notable for the following:    B Natriuretic Peptide 206.5 (*)    All other components within normal limits  URINALYSIS, ROUTINE W REFLEX MICROSCOPIC - Abnormal; Notable for the following:    Color, Urine STRAW (*)    Specific Gravity, Urine 1.004 (*)    Leukocytes, UA SMALL (*)    Squamous Epithelial / LPF 0-5 (*)    All other  components within normal limits  I-STAT CHEM 8, ED - Abnormal; Notable for the following:    Creatinine, Ser 1.40 (*)    All other components within normal limits  TROPONIN I  TSH  TROPONIN I  TROPONIN I  I-STAT TROPOININ, ED    EKG  EKG Interpretation  Date/Time:  Thursday October 19 2016 16:09:37 EST Ventricular Rate:  48 PR Interval:    QRS Duration: 101 QT Interval:  571 QTC Calculation: 511 R Axis:   9 Text Interpretation:  Sinus bradycardia Borderline prolonged PR interval Probable left ventricular hypertrophy Prolonged QT interval Confirmed by [facility] MD, Tayshawn Purnell [phone]) on 10/19/2016 4:16:16 PM       Radiology Dg Chest 2 View  Result Date: 10/19/2016 CLINICAL DATA:  Weakness for 4 days EXAM: CHEST  2 VIEW COMPARISON:  Mar 05, 2016 FINDINGS: The heart size and mediastinal contours are stable. The heart size is enlarged. Aorta is tortuous. Both lungs are clear. The visualized skeletal structures are stable. IMPRESSION: No active cardiopulmonary disease.  Cardiomegaly. Electronically Signed   By: Abelardo Diesel M.D.   On: 10/19/2016 18:33    Procedures Procedures (including critical care time)  Medications Ordered in ED Medications  nitroGLYCERIN (NITROSTAT) SL tablet 0.4 mg (0.4 mg Sublingual Given 10/19/16 1725)  potassium chloride (K-DUR,KLOR-CON) CR tablet 10 mEq (not administered)  amiodarone (PACERONE) tablet 200 mg (200 mg Oral Not Given 10/19/16 2258)  amLODipine (NORVASC) tablet 5 mg (not administered)  pravastatin (PRAVACHOL) tablet 20 mg (not administered)  aspirin EC tablet 81 mg (not administered)  isosorbide mononitrate (IMDUR) 24 hr tablet 30 mg (not administered)  ferrous sulfate tablet 325 mg (not administered)  levothyroxine (SYNTHROID, LEVOTHROID) tablet 100 mcg (not administered)  pantoprazole (PROTONIX) EC tablet 40 mg (not administered)  zolpidem (AMBIEN) tablet 5 mg (5 mg Oral Given 10/19/16 2258)  gi cocktail (Maalox,Lidocaine,Donnatal) (not  administered)  acetaminophen (TYLENOL) tablet 650 mg (not  administered)  ondansetron (ZOFRAN) injection 4 mg (not administered)  enoxaparin (LOVENOX) injection 30 mg (30 mg Subcutaneous Given 10/19/16 2301)  polyvinyl alcohol (LIQUIFILM TEARS) 1.4 % ophthalmic solution 2 drop (2 drops Both Eyes Given 10/19/16 2258)  aspirin chewable tablet 324 mg (324 mg Oral Given 10/19/16 1723)  fentaNYL (SUBLIMAZE) injection 50 mcg (50 mcg Intravenous Given 10/19/16 1935)     Initial Impression / Assessment and Plan / ED Course  I have reviewed the triage vital signs and the nursing notes.  Pertinent labs & imaging results that were available during my care of the patient were reviewed by me and considered in my medical decision making (see chart for details).  Clinical Course     Admitted to [facility] for ACS rule out after cardiology felt that she wouldn't need stress unless something was positive.  Weakness likely related to bradycardia because she takes too much metoprolol for her palpitations.   Final Clinical Impressions(s) / ED Diagnoses   Final diagnoses:  Nonspecific chest pain    New Prescriptions Current Discharge Medication List       Merrily Pew, MD 10/19/16 2345

## 2016-10-19 NOTE — ED Notes (Signed)
Attempted report X2 unable to take. 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

## 2016-10-19 NOTE — H&P (Signed)
History and Physical    Jeanne Hart [id number] DOB: 1927-03-24 DOA: 10/19/2016  PCP: Thressa Sheller, MD Consultants:  Ellyn Hack - cardiology Patient coming from: lives alone in an apartment; NOK: grandson, [phone]  Chief Complaint: chest pain  HPI: Jeanne Hart is a 83 y.o. female with medical history significant of afib, hypothyroidism, and CAD s/p stents presenting with intense chest pain and weakness.  Can only take something to sleep (Ambien 12.5 mg) every other night due to h/o sleep walking, thinks this is related to chronic weakness.  Too weak to walk, uses a walker now.  Left-sided chest pain with radiation down left arm.  Pain started 3 days ago and has gotten worse in the last few days.  +SOB.  No nausea.  No diaphoresis.  Weakness is chronic in nature but worse today.  Also takes medications in the AM - Amlodpine and beta blocker and amiodarone, takes prn beta blocker at night if she feels a rapid heart beat and possibly also prn amiodarone(?).  Has been taking beta blocker BID for several days in a row.    4/17 - normal MPS.  EF 63%. 3/16 - Echo with preserved EF   ED Course: chest pain, spoke to cardiology who recommended overnight obs; if negative enzymes, discharge without additional evaluation due to negative stress test in April.  Review of Systems: As per HPI; otherwise 10 point review of systems reviewed and negative.   Ambulatory Status:  Ambulates with a walker  Past Medical History:  Diagnosis Date  . CAD S/P percutaneous coronary angioplasty    PCI x 2; once at [facility] and once in [place];; Myoview April 2017: LOW RISK. EF 63%. No ischemia or infarction.  . Diverticulitis   . Dyslipidemia   . Dyspnea on exertion   . Family history of cardiovascular disease   . GERD (gastroesophageal reflux disease)   . Hiatal hernia   . HTN (hypertension)   . Hypothyroidism   . IBS (irritable bowel syndrome)   . Insomnia    chronic  . PAF (paroxysmal atrial  fibrillation) (HCC)    CHA2DS2VASC = 4.  coumadin was stopped secondary to GI Bleed, on amiodarone; monitor march 2014-NSR    Past Surgical History:  Procedure Laterality Date  . ABDOMINAL HYSTERECTOMY    . APPENDECTOMY    . CHOLECYSTECTOMY    . CORONARY ANGIOPLASTY WITH STENT PLACEMENT  01/05/2011   2.5x22mm Promus DES stent to RCA post dilated to 2.75x87mm   . CORONARY ANGIOPLASTY WITH STENT PLACEMENT  ?   3 stents per patient  . NM MYOVIEW LTD  April 2017   LOW RISK. EF 60%. No ischemia or infarction.    Social History   Social History  . Marital status: Widowed    Spouse name: N/A  . Number of children: 3  . Years of education: N/A   Occupational History  . Sales/Office-retired    Social History Main Topics  . Smoking status: Never Smoker  . Smokeless tobacco: Never Used  . Alcohol use No  . Drug use: No  . Sexual activity: Not on file   Other Topics Concern  . Not on file   Social History Narrative  . No narrative on file    Allergies  Allergen Reactions  . Nexium [Esomeprazole] Other (See Comments)    Aggravates IBS.   . Statins Other (See Comments)    Had jaundice as child.  Does tolerate current statin therapy.    . Codeine Other (  See Comments)    Post-surgical reaction of unknown type.  . Morphine And Related Other (See Comments)    Unknown.    Marland Kitchen Penicillins Other (See Comments)    Unknown. Has patient had a PCN reaction causing immediate rash, facial/tongue/throat swelling, SOB or lightheadedness with hypotension: YES Has patient had a PCN reaction causing severe rash involving mucus membranes or skin necrosis:NO Has patient had a PCN reaction that required hospitalization NO Has patient had a PCN reaction occurring within the last 10 years: NO If all of the above answers are "NO", then may proceed with Cephalosporin use.     . Sulfa Antibiotics Other (See Comments)    Unknown childhood reaction.      Family History  Problem Relation Age of  Onset  . Stroke Mother   . Diabetes Maternal Aunt   . Diabetes Son   . Heart disease      Both sides of family  . Colon cancer Neg Hx     Prior to Admission medications   Medication Sig Start Date End Date Taking? Authorizing Provider  amiodarone (PACERONE) 200 MG tablet TAKE 1 TABLET BY MOUTH ONCE DAILY AND 1 TABLET AS NEEDED FOR FAST HEART RATE 02/15/16   Leonie Man, MD  amLODipine (NORVASC) 5 MG tablet Take 1 tablet by mouth daily. take 1 tab dialy 05/07/15   Historical Provider, MD  Ascorbic Acid (VITAMIN C PO) Take 1 tablet by mouth daily.     Historical Provider, MD  aspirin EC 81 MG tablet Take 1 tablet (81 mg total) by mouth daily. 02/15/15   Leonie Man, MD  atenolol (TENORMIN) 25 MG tablet Take 25 mg by mouth daily after lunch. 02/28/14   Historical Provider, MD  Biotin 10 MG CAPS Take by mouth.    Historical Provider, MD  Cholecalciferol (VITAMIN D PO) Take 1 tablet by mouth daily.     Historical Provider, MD  ferrous sulfate 325 (65 FE) MG tablet Take one po BID Patient taking differently: Take 325 mg by mouth 2 (two) times daily with a meal.  04/06/14   Jessica D Zehr, PA-C  FLUOCINOLONE ACETONIDE SCALP 0.01 % OIL Apply 1 application topically as needed. No specific days 12/21/14   Historical Provider, MD  furosemide (LASIX) 20 MG tablet Take 1 tablet (20 mg total) by mouth daily as needed for edema (as needed for leg swelling.). 03/29/15   Leonie Man, MD  isosorbide mononitrate (IMDUR) 30 MG 24 hr tablet Take 1 tablet (30 mg total) by mouth daily. 09/09/14   Margarita Mail, PA-C  ketoconazole (NIZORAL) 2 % shampoo Use as directed 04/19/15   Historical Provider, MD  levothyroxine (SYNTHROID, LEVOTHROID) 100 MCG tablet Take 100 mcg by mouth daily before breakfast.    Historical Provider, MD  metoprolol tartrate (LOPRESSOR) 25 MG tablet Take 1 tablet nightly as needed for palpitations on the nights you do not take zolpidem. 04/24/16   Leonie Man, MD  nitroGLYCERIN  (NITROSTAT) 0.4 MG SL tablet Place 1 tablet (0.4 mg total) under the tongue every 5 (five) minutes as needed for chest pain. MAX 3 doses 01/12/16   Mihai Croitoru, MD  pantoprazole (PROTONIX) 40 MG tablet Take 40 mg by mouth daily.     Historical Provider, MD  Polyethyl Glycol-Propyl Glycol (SYSTANE) 0.4-0.3 % SOLN Apply 2 drops to eye at bedtime.    Historical Provider, MD  potassium chloride (K-DUR) 10 MEQ tablet Take 1 tablet (10 mEq total) by mouth daily.  03/05/16   Maryan Puls, MD  pravastatin (PRAVACHOL) 20 MG tablet Take 1 tablet by mouth daily. Take 1 tab daily 05/30/15   Historical Provider, MD  zolpidem (AMBIEN CR) 12.5 MG CR tablet Take 12.5 mg by mouth every other day.    Historical Provider, MD    Physical Exam: Vitals:   10/19/16 1900 10/19/16 1930 10/19/16 2000 10/19/16 2030  BP: 180/73 171/64 175/73 149/67  Pulse: (!) 49 (!) 54 (!) 53 (!) 53  Resp: 12  12 14   SpO2: 99% 98% 96% 91%     General:  Appears calm and comfortable and is NAD Eyes:  PERRL, EOMI, normal lids, iris ENT:  grossly normal hearing, lips & tongue, mmm Neck:  no LAD, masses or thyromegaly Cardiovascular:  RRR, no m/r/g. No LE edema.  Respiratory:  CTA bilaterally, no w/r/r. Normal respiratory effort. Abdomen:  soft, ntnd, NABS Skin: no rash or induration seen on limited exam Musculoskeletal:  grossly normal tone BUE/BLE, good ROM, no bony abnormality Psychiatric:  grossly normal mood and affect, speech fluent and appropriate, AOx3 Neurologic:  CN 2-12 grossly intact, moves all extremities in coordinated fashion, sensation intact  Labs on Admission: I have personally reviewed following labs and imaging studies  CBC:  Recent Labs Lab 10/19/16 1630 10/19/16 1710  WBC 4.0  --   NEUTROABS 2.4  --   HGB 12.8 12.9  HCT 38.7 38.0  MCV 92.1  --   PLT 125*  --    Basic Metabolic Panel:  Recent Labs Lab 10/19/16 1630 10/19/16 1710  NA 139 142  K 3.6 3.5  CL 107 105  CO2 24  --   GLUCOSE 92 92   BUN 17 20  CREATININE 1.37* 1.40*  CALCIUM 9.3  --    GFR: CrCl cannot be calculated (Unknown ideal weight.). Liver Function Tests:  Recent Labs Lab 10/19/16 1630  AST 60*  ALT 57*  ALKPHOS 77  BILITOT 0.7  PROT 6.1*  ALBUMIN 3.2*   No results for input(s): LIPASE, AMYLASE in the last 168 hours. No results for input(s): AMMONIA in the last 168 hours. Coagulation Profile: No results for input(s): INR, PROTIME in the last 168 hours. Cardiac Enzymes: No results for input(s): CKTOTAL, CKMB, CKMBINDEX, TROPONINI in the last 168 hours. BNP (last 3 results) No results for input(s): PROBNP in the last 8760 hours. HbA1C: No results for input(s): HGBA1C in the last 72 hours. CBG: No results for input(s): GLUCAP in the last 168 hours. Lipid Profile: No results for input(s): CHOL, HDL, LDLCALC, TRIG, CHOLHDL, LDLDIRECT in the last 72 hours. Thyroid Function Tests: No results for input(s): TSH, T4TOTAL, FREET4, T3FREE, THYROIDAB in the last 72 hours. Anemia Panel: No results for input(s): VITAMINB12, FOLATE, FERRITIN, TIBC, IRON, RETICCTPCT in the last 72 hours. Urine analysis:    Component Value Date/Time   COLORURINE STRAW (A) 10/19/2016 1838   APPEARANCEUR CLEAR 10/19/2016 1838   LABSPEC 1.004 (L) 10/19/2016 1838   PHURINE 7.0 10/19/2016 1838   GLUCOSEU NEGATIVE 10/19/2016 1838   HGBUR NEGATIVE 10/19/2016 1838   BILIRUBINUR NEGATIVE 10/19/2016 1838   KETONESUR NEGATIVE 10/19/2016 1838   PROTEINUR NEGATIVE 10/19/2016 1838   UROBILINOGEN 0.2 05/10/2015 1741   NITRITE NEGATIVE 10/19/2016 1838   LEUKOCYTESUR SMALL (A) 10/19/2016 1838    Creatinine Clearance: CrCl cannot be calculated (Unknown ideal weight.).  Sepsis Labs: @LABRCNTIP (procalcitonin:4,lacticidven:4) )No results found for this or any previous visit (from the past 240 hour(s)).   Radiological Exams on Admission: Dg Chest  2 View  Result Date: 10/19/2016 CLINICAL DATA:  Weakness for 4 days EXAM: CHEST  2  VIEW COMPARISON:  Mar 05, 2016 FINDINGS: The heart size and mediastinal contours are stable. The heart size is enlarged. Aorta is tortuous. Both lungs are clear. The visualized skeletal structures are stable. IMPRESSION: No active cardiopulmonary disease.  Cardiomegaly. Electronically Signed   By: Abelardo Diesel M.D.   On: 10/19/2016 18:33    EKG: Independently reviewed.  Sinus bradycardia with rate 48; prolonged QT (511), no evidence of acute ischemia  Assessment/Plan Principal Problem:   Chest pain Active Problems:   Essential hypertension   Hyperlipidemia with target LDL less than 70   Hypothyroidism   Weakness generalized   CKD (chronic kidney disease) stage 3, GFR 30-59 ml/min   Elevated LFTs   Thrombocytopenia (HCC)   Chest pain -Patient with left-sided chest pain that has been present for several days. -Symptoms suggestive of noncardiac chest pain.  -CXR unremarkable.   -Initial cardiac enzymes negative.   -EKG not indicative of acute ischemia.   -Patient had a normal MPS in 4/17.  -Will plan to place in observation status on telemetry to rule out ACS by overnight observation.  -cycle troponin q6h x 3 and repeat EKG in AM -Continue Imdur and ASA daily -morphine given -Plan for discharge to home tomorrow if troponins are negative and there are no EKG changes -BNP is elevated but stable - 206.5 (281.5 in 8/16) -Her bradycardia is likely due to her medications, which may need consideration.  She appears to be taking 2 beta blockers - both Atenolol daily and Lopressor prn.  She also appears to be taking Amiodarone daily and prn.   Weakness -As above, may be at least influenced by bradycardia and medications. -She is taking a large number of medications for someone her age; would consider weaning medications and discontinuing them if possible to avoid polypharmacy. -It may also simply be normal aging.  CKD -Creatinine 1.37 (1.25 in 5/17) -Stable  Elevated LFTs -AST/ALT  60/57 -This is new for the patient -May be related to statin use -Since they are not >2.5 times the normal limit, suggest just following for now without intervention  Thrombocytopenia -Platelets 125 (133 in [id number]) -Stable -Uncertain etiology -Will follow -If platelets drop <100, will need to discontinue Lovenox  DVT prophylaxis:  Lovenox Code Status: Full - confirmed with patient/family Family Communication: Grandson present throughout evaluation Disposition Plan:  Home once clinically improved Consults called: Cardiology (by telephone from ER - will need to reconsult if patient needs to be seen tomorrow)  Admission status: It is my clinical opinion that referral for OBSERVATION is reasonable and necessary in this patient based on the above information provided. The aforementioned taken together are felt to place the patient at high risk for further clinical deterioration. However it is anticipated that the patient may be medically stable for discharge from the hospital within 24 to 48 hours.    Karmen Bongo MD [facility]  If 7PM-7AM, please contact night-coverage [email or web address] Password [facility]  10/19/2016, 9:16 PM

## 2016-10-20 DIAGNOSIS — E039 Hypothyroidism, unspecified: Secondary | ICD-10-CM

## 2016-10-20 DIAGNOSIS — R0602 Shortness of breath: Secondary | ICD-10-CM | POA: Diagnosis not present

## 2016-10-20 DIAGNOSIS — R0789 Other chest pain: Secondary | ICD-10-CM | POA: Diagnosis not present

## 2016-10-20 DIAGNOSIS — I10 Essential (primary) hypertension: Secondary | ICD-10-CM

## 2016-10-20 DIAGNOSIS — N183 Chronic kidney disease, stage 3 (moderate): Secondary | ICD-10-CM | POA: Diagnosis not present

## 2016-10-20 DIAGNOSIS — Z79899 Other long term (current) drug therapy: Secondary | ICD-10-CM | POA: Diagnosis not present

## 2016-10-20 DIAGNOSIS — R531 Weakness: Secondary | ICD-10-CM

## 2016-10-20 DIAGNOSIS — I2510 Atherosclerotic heart disease of native coronary artery without angina pectoris: Secondary | ICD-10-CM | POA: Diagnosis not present

## 2016-10-20 DIAGNOSIS — D696 Thrombocytopenia, unspecified: Secondary | ICD-10-CM

## 2016-10-20 DIAGNOSIS — Z9861 Coronary angioplasty status: Secondary | ICD-10-CM | POA: Diagnosis not present

## 2016-10-20 DIAGNOSIS — E785 Hyperlipidemia, unspecified: Secondary | ICD-10-CM | POA: Diagnosis not present

## 2016-10-20 DIAGNOSIS — I129 Hypertensive chronic kidney disease with stage 1 through stage 4 chronic kidney disease, or unspecified chronic kidney disease: Secondary | ICD-10-CM | POA: Diagnosis not present

## 2016-10-20 DIAGNOSIS — R7989 Other specified abnormal findings of blood chemistry: Secondary | ICD-10-CM

## 2016-10-20 DIAGNOSIS — Z7982 Long term (current) use of aspirin: Secondary | ICD-10-CM | POA: Diagnosis not present

## 2016-10-20 LAB — TROPONIN I
Troponin I: <0.03 ng/mL (ref <0.03)
Troponin I: <0.03 ng/mL (ref <0.03)

## 2016-10-20 MED ORDER — FERROUS SULFATE 325 (65 FE) MG PO TABS
325.0000 mg | ORAL_TABLET | Freq: Two times a day (BID) | ORAL | Status: DC
Start: 2016-10-20 — End: 2017-12-10

## 2016-10-20 MED ORDER — GI COCKTAIL ~~LOC~~: 30.0000 mL | Freq: Four times a day (QID) | ORAL | 3 refills | Status: DC | PRN

## 2016-10-20 NOTE — Care Management Note (Signed)
Case Management Note  Patient Details  Name: XYLIANA LINHARES MRN: [id number] Date of Birth: October 09, 1927  Subjective/Objective:  Pt presented for chest pain. Pt is from the [facility] Plan will be to return once stable.  Pt has grandson at the bedside and he will provide transportation home. Pt uses a RW at the [facility].                 Action/Plan: No needs identified at this time.  Expected Discharge Date:  10/20/16               Expected Discharge Plan:  Home/Self Care  In-House Referral:  NA  Discharge planning Services  CM Consult  Post Acute Care Choice:  NA Choice offered to:  NA  DME Arranged:  N/A DME Agency:  NA  HH Arranged:  NA HH Agency:  NA  Status of Service:  Completed, signed off  If discussed at [place] of Stay Meetings, dates discussed:    Additional Comments:  Bethena Roys, RN 10/20/2016, 11:08 AM

## 2016-10-20 NOTE — Care Management Obs Status (Signed)
[place] NOTIFICATION   Patient Details  Name: AMARRIE HULETTE MRN: [id number] Date of Birth: 04-16-1927   Medicare Observation Status Notification Given:  Yes    Bethena Roys, RN 10/20/2016, 11:08 AM

## 2016-10-20 NOTE — Discharge Summary (Signed)
Physician Discharge Summary  Jeanne Hart [id number] DOB: 1927/07/23 DOA: 10/19/2016  PCP: Thressa Sheller, MD  Admit date: 10/19/2016 Discharge date: 10/20/2016  Admitted From: Home Discharge disposition: Home   Recommendations for Outpatient Follow-Up:   1. Follow-up with PCP for nonresolution of symptoms. 2. Repeat LFTs in 1-2 weeks to ensure that transaminases aren't continuing to rise.   Discharge Diagnosis:   Principal Problem:    Chest pain Active Problems:    Essential hypertension    Hyperlipidemia with target LDL less than 70    Hypothyroidism    Weakness generalized    CKD (chronic kidney disease) stage 3, GFR 30-59 ml/min    Elevated LFTs    Thrombocytopenia ([place])    Discharge Condition: Improved.  Diet recommendation: Low sodium, heart healthy.    History of Present Illness:   83 year old female with a PMH of GERD who was admitted 10/19/16 for evaluation of atypical chest pain.   Hospital Course by Problem:   Chest pain -Patient with left-sided chest pain that has been present for several days. -Symptoms suggestive of noncardiac chest pain.  -CXR unremarkable.  -Initial cardiac enzymes negative, And subsequent enzymes remained negative 3.  -EKG not indicative of acute ischemia.  -Patient had a normal MPS in 4/17.  -Continue Imdur and ASA daily -BNP is elevated but stable - 206.5 (281.5 in 8/16) -Her bradycardia is likely due to her medications, which may need consideration.  She appears to be taking 2 beta blockers - both Atenolol daily and Lopressor prn.  She also appears to be taking Amiodarone daily and prn.  -Recommend close outpatient follow-up with PCP for nonresolution of symptoms and to evaluate her medications and make adjustments if needed.  Weakness -As above, may be at least influenced by bradycardia and medications. -She is taking a large number of medications for someone her age; would consider weaning medications  and discontinuing them if possible to avoid polypharmacy. -It may also simply be normal aging.  CKD -Creatinine 1.37 (1.25 in 5/17) -Stable  Elevated LFTs -AST/ALT 60/57 -This is new for the patient -May be related to statin use -Since they are not >2.5 times the normal limit, suggest just following for now without intervention  Thrombocytopenia -Platelets 125 (133 in [id number]) -Stable -Uncertain etiology  Medical Consultants:    None.   Discharge Exam:   Vitals:   10/20/16 0435 10/20/16 0801  BP: (!) 115/49   Pulse: (!) 50 (!) 50  Resp: 15   Temp: 98.1 F (36.7 C) 98 F (36.7 C)   Vitals:   10/20/16 0019 10/20/16 0119 10/20/16 0435 10/20/16 0801  BP: (!) 109/44 (!) 111/45 (!) 115/49   Pulse:   (!) 50 (!) 50  Resp: 17 17 15    Temp:   98.1 F (36.7 C) 98 F (36.7 C)  TempSrc:   Oral Oral  SpO2:   94% 94%  Weight:   61 kg (134 lb 6.4 oz)   Height:        General exam: Appears calm and comfortable.  Respiratory system: Clear to auscultation. Respiratory effort normal. Cardiovascular system: S1 & S2 heard, RRR. No JVD,  rubs, gallops or clicks. No murmurs. Gastrointestinal system: Abdomen is nondistended, soft and nontender. No organomegaly or masses felt. Normal bowel sounds heard. Central nervous system: Alert and oriented. No focal neurological deficits. Extremities: No clubbing,  or cyanosis. No edema. Skin: No rashes, lesions or ulcers. Psychiatry: Judgement and insight appear normal. Mood & affect appropriate.  The results of significant diagnostics from this hospitalization (including imaging, microbiology, ancillary and laboratory) are listed below for reference.     Procedures and Diagnostic Studies:   Dg Chest 2 View  Result Date: 10/19/2016 CLINICAL DATA:  Weakness for 4 days EXAM: CHEST  2 VIEW COMPARISON:  Mar 05, 2016 FINDINGS: The heart size and mediastinal contours are stable. The heart size is enlarged. Aorta is tortuous. Both lungs are  clear. The visualized skeletal structures are stable. IMPRESSION: No active cardiopulmonary disease.  Cardiomegaly. Electronically Signed   By: Abelardo Diesel M.D.   On: 10/19/2016 18:33     Labs:   Basic Metabolic Panel:  Recent Labs Lab 10/19/16 1630 10/19/16 1710  NA 139 142  K 3.6 3.5  CL 107 105  CO2 24  --   GLUCOSE 92 92  BUN 17 20  CREATININE 1.37* 1.40*  CALCIUM 9.3  --    GFR Estimated Creatinine Clearance: 26.5 mL/min (by C-G formula based on SCr of 1.4 mg/dL (H)). Liver Function Tests:  Recent Labs Lab 10/19/16 1630  AST 60*  ALT 57*  ALKPHOS 77  BILITOT 0.7  PROT 6.1*  ALBUMIN 3.2*   CBC:  Recent Labs Lab 10/19/16 1630 10/19/16 1710  WBC 4.0  --   NEUTROABS 2.4  --   HGB 12.8 12.9  HCT 38.7 38.0  MCV 92.1  --   PLT 125*  --    Cardiac Enzymes:  Recent Labs Lab 10/19/16 2147 10/20/16 0523 10/20/16 0901  TROPONINI <0.03 <0.03 <0.03   Thyroid function studies  Recent Labs  10/19/16 2147  TSH 3.222     Discharge Instructions:   Discharge Instructions    Call MD for:  extreme fatigue    Complete by:  As directed    Call MD for:  persistant dizziness or light-headedness    Complete by:  As directed    Call MD for:  severe uncontrolled pain    Complete by:  As directed    Diet - low sodium heart healthy    Complete by:  As directed    Increase activity slowly    Complete by:  As directed      Allergies as of 10/20/2016      Reactions   Nexium [esomeprazole] Other (See Comments)   Aggravates IBS.    Statins Other (See Comments)   Had jaundice as child.  Does tolerate current statin therapy.     Codeine Other (See Comments)   Post-surgical reaction of unknown type.   Morphine And Related Other (See Comments)   Unknown.     Penicillins Other (See Comments)   Unknown. Has patient had a PCN reaction causing immediate rash, facial/tongue/throat swelling, SOB or lightheadedness with hypotension: YES Has patient had a PCN  reaction causing severe rash involving mucus membranes or skin necrosis:NO Has patient had a PCN reaction that required hospitalization NO Has patient had a PCN reaction occurring within the last 10 years: NO If all of the above answers are "NO", then may proceed with Cephalosporin use.   Sulfa Antibiotics Other (See Comments)   Unknown childhood reaction.        Medication List    TAKE these medications   amiodarone 200 MG tablet Commonly known as:  PACERONE TAKE 1 TABLET BY MOUTH ONCE DAILY AND 1 TABLET AS NEEDED FOR FAST HEART RATE   amLODipine 5 MG tablet Commonly known as:  NORVASC Take 1 tablet by mouth daily. take 1 tab dialy  aspirin EC 81 MG tablet Take 1 tablet (81 mg total) by mouth daily.   Biotin 10 MG Caps Take 1 tablet by mouth daily.   ferrous sulfate 325 (65 FE) MG tablet Take 1 tablet (325 mg total) by mouth 2 (two) times daily with a meal.   Fluocinolone Acetonide Scalp 0.01 % Oil Apply 1 application topically as needed. No specific days   furosemide 20 MG tablet Commonly known as:  LASIX Take 1 tablet (20 mg total) by mouth daily as needed for edema (as needed for leg swelling.).   gi cocktail Susp suspension Take 30 mLs by mouth 4 (four) times daily as needed for indigestion (or chest pain). Shake well.   isosorbide mononitrate 30 MG 24 hr tablet Commonly known as:  IMDUR Take 1 tablet (30 mg total) by mouth daily.   ketoconazole 2 % shampoo Commonly known as:  NIZORAL Apply 1 application topically 2 (two) times a week. Use as directed   levothyroxine 100 MCG tablet Commonly known as:  SYNTHROID, LEVOTHROID Take 100 mcg by mouth daily before breakfast.   metoprolol tartrate 25 MG tablet Commonly known as:  LOPRESSOR Take 25 mg by mouth daily as needed. For heart rhythm   nitroGLYCERIN 0.4 MG SL tablet Commonly known as:  NITROSTAT Place 1 tablet (0.4 mg total) under the tongue every 5 (five) minutes as needed for chest pain. MAX 3 doses     pantoprazole 40 MG tablet Commonly known as:  PROTONIX Take 40 mg by mouth daily.   potassium chloride 10 MEQ tablet Commonly known as:  K-DUR Take 1 tablet (10 mEq total) by mouth daily.   pravastatin 20 MG tablet Commonly known as:  PRAVACHOL Take 1 tablet by mouth daily. Take 1 tab daily   SYSTANE 0.4-0.3 % Soln Generic drug:  Polyethyl Glycol-Propyl Glycol Apply 2 drops to eye at bedtime.   VITAMIN C PO Take 1 tablet by mouth daily.   VITAMIN D PO Take 1 tablet by mouth daily.   zolpidem 12.5 MG CR tablet Commonly known as:  AMBIEN CR Take 12.5 mg by mouth every other day.      Follow-up Information    Thressa Sheller, MD Follow up.   Specialty:  Internal Medicine Why:  Make a follow up appointment as needed for recurrent symptoms of chest discomfort or reflux. Contact information: [address] [place] [address] [phone]            Time coordinating discharge: 25 minutes.  Signed:  RAMA,CHRISTINA  Pager [phone] [facility] 10/20/2016, 4:11 PM

## 2016-10-20 NOTE — Discharge Instructions (Signed)
Chest Wall Pain °Chest wall pain is pain in or around the bones and muscles of your chest. Sometimes, an injury causes this pain. Sometimes, the cause may not be known. This pain may take several weeks or longer to get better. °Follow these instructions at home: °Pay attention to any changes in your symptoms. Take these actions to help with your pain: °· Rest as told by your health care provider. °· Avoid activities that cause pain. These include any activities that use your chest muscles or your abdominal and side muscles to lift heavy items. °· If directed, apply ice to the painful area: °¨ Put ice in a plastic bag. °¨ Place a towel between your skin and the bag. °¨ Leave the ice on for 20 minutes, 2-3 times per day. °· Take over-the-counter and prescription medicines only as told by your health care provider. °· Do not use tobacco products, including cigarettes, chewing tobacco, and e-cigarettes. If you need help quitting, ask your health care provider. °· Keep all follow-up visits as told by your health care provider. This is important. °Contact a health care provider if: °· You have a fever. °· Your chest pain becomes worse. °· You have new symptoms. °Get help right away if: °· You have nausea or vomiting. °· You feel sweaty or light-headed. °· You have a cough with phlegm (sputum) or you cough up blood. °· You develop shortness of breath. °This information is not intended to replace advice given to you by your health care provider. Make sure you discuss any questions you have with your health care provider. °Document Released: 09/25/2005 Document Revised: 02/03/2016 Document Reviewed: 12/21/2014 °Elsevier Interactive Patient Education © 2017 Elsevier Inc. ° °

## 2016-11-17 ENCOUNTER — Encounter: Payer: Self-pay | Admitting: Cardiology

## 2016-11-17 ENCOUNTER — Ambulatory Visit (INDEPENDENT_AMBULATORY_CARE_PROVIDER_SITE_OTHER): Payer: Medicare Other | Admitting: Cardiology

## 2016-11-17 VITALS — BP 139/62 | HR 43 | Ht 63.0 in | Wt 137.2 lb

## 2016-11-17 DIAGNOSIS — I10 Essential (primary) hypertension: Secondary | ICD-10-CM

## 2016-11-17 DIAGNOSIS — Z8679 Personal history of other diseases of the circulatory system: Secondary | ICD-10-CM

## 2016-11-17 DIAGNOSIS — R002 Palpitations: Secondary | ICD-10-CM

## 2016-11-17 DIAGNOSIS — E785 Hyperlipidemia, unspecified: Secondary | ICD-10-CM | POA: Diagnosis not present

## 2016-11-17 DIAGNOSIS — I480 Paroxysmal atrial fibrillation: Secondary | ICD-10-CM | POA: Diagnosis not present

## 2016-11-17 DIAGNOSIS — I2510 Atherosclerotic heart disease of native coronary artery without angina pectoris: Secondary | ICD-10-CM | POA: Diagnosis not present

## 2016-11-17 DIAGNOSIS — Z9861 Coronary angioplasty status: Secondary | ICD-10-CM

## 2016-11-17 NOTE — Progress Notes (Signed)
PCP: Thressa Sheller, MD  Clinic Note: Chief Complaint  Patient presents with  . Follow-up    CAD-PCI and palpitations/PAF  . Numbness    in feet.    HPI: Jeanne Hart is a 83 y.o. female with a PMH below who presents today for  6 month f/u - h/o PAF (off of warfarin after GI Bleed), CAD-PTCA-PCI x 2 .   Jeanne Hart was last seen in July 2017. We reviewed the results of her monitor and Myoview. Monitor was relatively benign. She still noted some palpitations but relatively infrequently and at night. She was using when necessary atenolol for palpitations. But then she felt tired and weak during the day. We continued amiodarone plus a standing dose of atenolol with when necessary metoprolol (although it would appear that she is no longer taking Atenolol & is using Metoprolol daily)  No recent Hospitalizations or Studies.  Interval History: Jeanne Hart for follow-up along with her son who really helps with coming her history. Jeanne Hart has a hard time remembering her symptoms, and states still says she has a lot of difficulty with PND and dyspnea as well as palpitations, but this is usually the nights that she tries to go to sleep without using her Ambien. On nights that she takes red meat, she does not have any of his symptoms. During the daytime she doesn't have any significant palpitation symptoms, however she will take the metoprolol down most every day result, she is really doing much of anything because she is very fatigued. She's not having any real chest tightness or pressure symptoms unless she feels palpitations. She does not have any edema. No sick. Near-syncope. A lot of her symptoms seem to be related to anxiety and stress and not a true cardiac issue.   ROS: A comprehensive was performed. Review of Systems  Constitutional: Negative for malaise/fatigue.  HENT: Negative for congestion and nosebleeds.   Respiratory: Negative for cough, shortness of breath and wheezing.     Cardiovascular: Negative for claudication.       Per history of present illness  Gastrointestinal: Negative for blood in stool and melena.  Genitourinary: Negative for hematuria.  Musculoskeletal: Positive for back pain and joint pain. Negative for falls.  Neurological: Positive for dizziness (With palpitations) and headaches. Negative for weakness.  Endo/Heme/Allergies: Does not bruise/bleed easily.  Psychiatric/Behavioral: Positive for memory loss. The patient is nervous/anxious and has insomnia (She takes Ambien but only every other night. On the other nights, she'll wake up in the living room several t times at night).   All other systems reviewed and are negative.   Past Medical History:  Diagnosis Date  . CAD S/P percutaneous coronary angioplasty    PCI x 2; once at [facility] and once in [place];; Myoview April 2017: LOW RISK. EF 63%. No ischemia or infarction.  . Diverticulitis   . Dyslipidemia   . Dyspnea on exertion   . Family history of cardiovascular disease   . GERD (gastroesophageal reflux disease)   . Hiatal hernia   . HTN (hypertension)   . Hypothyroidism   . IBS (irritable bowel syndrome)   . Insomnia    chronic  . PAF (paroxysmal atrial fibrillation) (HCC)    CHA2DS2VASC = 4.  coumadin was stopped secondary to GI Bleed, on amiodarone; monitor march 2014-NS[facility]    Past Surgical History:  Procedure Laterality Date  . ABDOMINAL HYSTERECTOMY    . APPENDECTOMY    . CHOLECYSTECTOMY    .  CORONARY ANGIOPLASTY WITH STENT PLACEMENT  01/05/2011   2.5x42mm Promus DES stent to RCA post dilated to 2.75x82mm   . CORONARY ANGIOPLASTY WITH STENT PLACEMENT  ?   3 stents per patient  . NM MYOVIEW LTD  April 2017   LOW RISK. EF 60%. No ischemia or infarction.    Cardiac Event Monitor - April-May 2017     Mostly sinus rhythm with sinus bradycardia to normal sinus rhythm: Rates 42-80 bpm  Rare PVCs noted. Patient not aware of symptoms.  No arrhythmias noted.  Patient  did note feeling fatigue with bradycardia.  Summary: The patient's monitoring period was 01/27/2016 - 02/21/2016. Baseline sample showed Sinus Bradycardia w/Interpolated PVCs (2)/Artifact with a heart rate of 57.3 bpm. There were 0 critical, 0 serious, and 2 stable events that occurred. The report analysis of the critical, serious, stable and manually triggered events are listed below.    Current Meds  Medication Sig  . amiodarone (PACERONE) 200 MG tablet TAKE 1 TABLET BY MOUTH ONCE DAILY AND 1 TABLET AS NEEDED FOR FAST HEART RATE  . amLODipine (NORVASC) 5 MG tablet Take 1 tablet by mouth daily. take 1 tab dialy  . Ascorbic Acid (VITAMIN C PO) Take 1 tablet by mouth daily.   Marland Kitchen aspirin EC 81 MG tablet Take 1 tablet (81 mg total) by mouth daily.  . Biotin 10 MG CAPS Take 1 tablet by mouth daily.   . Cholecalciferol (VITAMIN D PO) Take 1 tablet by mouth daily.   . ferrous sulfate 325 (65 FE) MG tablet Take 1 tablet (325 mg total) by mouth 2 (two) times daily with a meal.  . FLUOCINOLONE ACETONIDE SCALP 0.01 % OIL Apply 1 application topically as needed. No specific days  . furosemide (LASIX) 20 MG tablet Take 1 tablet (20 mg total) by mouth daily as needed for edema (as needed for leg swelling.).  Marland Kitchen isosorbide mononitrate (IMDUR) 30 MG 24 hr tablet Take 1 tablet (30 mg total) by mouth daily.  Marland Kitchen ketoconazole (NIZORAL) 2 % shampoo Apply 1 application topically 2 (two) times a week. Use as directed   . levothyroxine (SYNTHROID, LEVOTHROID) 100 MCG tablet Take 100 mcg by mouth daily before breakfast.  . metoprolol tartrate (LOPRESSOR) 25 MG tablet Take 25 mg by mouth daily as needed. For heart rhythm  . nitroGLYCERIN (NITROSTAT) 0.4 MG SL tablet Place 1 tablet (0.4 mg total) under the tongue every 5 (five) minutes as needed for chest pain. MAX 3 doses  . pantoprazole (PROTONIX) 40 MG tablet Take 40 mg by mouth daily.   Vladimir Faster Glycol-Propyl Glycol (SYSTANE) 0.4-0.3 % SOLN Apply 2 drops to eye  at bedtime.  . potassium chloride (K-DUR) 10 MEQ tablet Take 1 tablet (10 mEq total) by mouth daily.  Marland Kitchen zolpidem (AMBIEN CR) 12.5 MG CR tablet Take 12.5 mg by mouth every other day.    Allergies  Allergen Reactions  . Nexium [Esomeprazole] Other (See Comments)    Aggravates IBS.   . Statins Other (See Comments)    Had jaundice as child.  Does tolerate current statin therapy.    . Codeine Other (See Comments)    Post-surgical reaction of unknown type.  . Morphine And Related Other (See Comments)    Unknown.    Marland Kitchen Penicillins Other (See Comments)    Unknown. Has patient had a PCN reaction causing immediate rash, facial/tongue/throat swelling, SOB or lightheadedness with hypotension: YES Has patient had a PCN reaction causing severe rash involving mucus membranes or  skin necrosis:NO Has patient had a PCN reaction that required hospitalization NO Has patient had a PCN reaction occurring within the last 10 years: NO If all of the above answers are "NO", then may proceed with Cephalosporin use.     . Sulfa Antibiotics Other (See Comments)    Unknown childhood reaction.       Social History   Social History  . Marital status: Widowed    Spouse name: N/A  . Number of children: 3  . Years of education: N/A   Occupational History  . Sales/Office-retired    Social History Main Topics  . Smoking status: Never Smoker  . Smokeless tobacco: Never Used  . Alcohol use No  . Drug use: No  . Sexual activity: Not Asked   Other Topics Concern  . None   Social History Narrative  . None   Family History  Problem Relation Age of Onset  . Stroke Mother   . Diabetes Maternal Aunt   . Diabetes Son   . Heart disease      Both sides of family  . Colon cancer Neg Hx     Wt Readings from Last 3 Encounters:  11/17/16 62.2 kg (137 lb 3.2 oz)  10/20/16 61 kg (134 lb 6.4 oz)  04/24/16 67.6 kg (149 lb)    PHYSICAL EXAM BP 139/62   Pulse (!) 43   Ht 5\' 3"  (1.6 m)   Wt 62.2 kg (137  lb 3.2 oz)   BMI 24.30 kg/m  General appearance: alert, cooperative, appears stated age, no distress - but seems concerned and otherwise healthy. Neck: no adenopathy, no carotid bruit and no JVD Lungs: Mostly CTAB, normal percussion bilaterally and non-labored Heart: regular rate and rhythm, S1&S2 normal, no murmur, + S4 gallop, no rubs. Nondisplaced PMI. Abdomen: soft, non-tender; bowel sounds normal; no masses, no organomegaly; no HJR Extremities: extremities normal, atraumatic, no cyanosis, and trace to 1+ edema  Pulses: 2+ and symmetric; Skin: normal and mobility and turgor normal  Neurologic: Mental status: Alert, oriented, thought content appropriate Cranial nerves: normal (II-XII grossly intact)   Adult ECG Report Sinus bradycardia with first-degree AV block. Rate 42 bpm. Rate is slower than I would've expected.   Other studies Reviewed: Additional studies/ records that were reviewed today include:  Recent Labs:  PCP checks cholesterol Lab Results  Component Value Date   TSH 3.222 10/19/2016   Lab Results  Component Value Date   HGB 12.9 10/19/2016     ASSESSMENT / PLAN: Problem List Items Addressed This Visit    History of unstable angina (Chronic)    No further anginal symptoms. He is on amlodipine. We are backing off the metoprolol but there is beta blocker component of amiodarone.      Relevant Orders   EKG 12-Lead   CAD S/P percutaneous coronary angioplasty (Chronic)    No active anginal symptoms. She is on aspirin. We are going to wean off metoprolol but continue amiodarone for the beta blocker effect as well as amlodipine. She is not on a statin, and has not taken her Imdur.      Relevant Orders   EKG 12-Lead   Hyperlipidemia with target LDL less than 70 (Chronic)   Relevant Orders   EKG 12-Lead   Paroxysmal atrial fibrillation [facility]): Not on Anticoagulation 2/2 GIB history.  CHA2DS2VASC =4 - Primary (Chronic)    Total says she was feeling  palpitations back when she wore her monitor but did not have any evidence  of A. fib. For now but continue amiodarone for rate/rhythm control. Since she has bradycardia, we'll stop atenolol and only use when necessary beta blocker.  The symptoms she describes usually last anywhere from 5-10 minutes. My recommendation would therefore be to only use when necessary medications if symptoms last closer to an hour. She is not overtaking her beta blocker. If her symptoms persist another hour after taking the beta blocker, she can take an additional amiodarone dose. If this does not resolve her symptoms with an hour so, she should then call for help.  She is not on full endocrine relation due to history of falls, and GI bleed etc. Continue with aspirin alone.      Relevant Orders   EKG 12-Lead   Essential hypertension (Chronic)    Stable blood pressure      Relevant Orders   EKG 12-Lead   Palpitations (Chronic)    No true arrhythmia on Monitor previously -- continue Amiodarone alone without BB - use only PRN (see pt. Instructions).      Relevant Orders   EKG 12-Lead      Current medicines are reviewed at length with the patient today. (+/- concerns)  The following changes have been made: Only take Metoprolol as PRN - see below  Patient Instructions  MAY USE  EXTRA METOPROLOL IF HEART RATE HAS INCREASE OR EXTRA BEATS LASTING MORE THAN 30 -59 MINUTES  IF THAT DOES NOT HELP  AND IT LAST MORE TAHN 2 HOURS MAY TAKE AN EXTAR AMIODARONE THAT DAY.   NO OTHER CHANGES    Your physician wants you to follow-up in [place]. You will receive a reminder letter in the mail two months in advance. If you don't receive a letter, please call our office to schedule the follow-up appointment.   If you need a refill on your cardiac medications before your next appointment, please call your pharmacy.    Studies Ordered:   Orders Placed This Encounter  Procedures  . EKG 12-Lead       Glenetta Hew, M.D., M.S. Interventional Cardiologist   Pager # [phone] Phone # [phone] [address] [place], [place] [address]

## 2016-11-17 NOTE — Patient Instructions (Signed)
MAY USE  EXTRA METOPROLOL IF HEART RATE HAS INCREASE OR EXTRA BEATS LASTING MORE THAN 30 -36 MINUTES  IF THAT DOES NOT HELP  AND IT LAST MORE TAHN 2 HOURS MAY TAKE AN EXTAR AMIODARONE THAT DAY.   NO OTHER CHANGES    Your physician wants you to follow-up in [place]. You will receive a reminder letter in the mail two months in advance. If you don't receive a letter, please call our office to schedule the follow-up appointment.   If you need a refill on your cardiac medications before your next appointment, please call your pharmacy.

## 2016-11-19 ENCOUNTER — Encounter: Payer: Self-pay | Admitting: Cardiology

## 2016-11-19 NOTE — Assessment & Plan Note (Signed)
No active anginal symptoms. She is on aspirin. We are going to wean off metoprolol but continue amiodarone for the beta blocker effect as well as amlodipine. She is not on a statin, and has not taken her Imdur.

## 2016-11-19 NOTE — Assessment & Plan Note (Signed)
Stable blood pressure 

## 2016-11-19 NOTE — Assessment & Plan Note (Signed)
No true arrhythmia on Monitor previously -- continue Amiodarone alone without BB - use only PRN (see pt. Instructions).

## 2016-11-19 NOTE — Assessment & Plan Note (Signed)
No further anginal symptoms. He is on amlodipine. We are backing off the metoprolol but there is beta blocker component of amiodarone.

## 2016-11-19 NOTE — Assessment & Plan Note (Addendum)
Total says she was feeling palpitations back when she wore her monitor but did not have any evidence of A. fib. For now but continue amiodarone for rate/rhythm control. Since she has bradycardia, we'll stop atenolol and only use when necessary beta blocker.  The symptoms she describes usually last anywhere from 5-10 minutes. My recommendation would therefore be to only use when necessary medications if symptoms last closer to an hour. She is not overtaking her beta blocker. If her symptoms persist another hour after taking the beta blocker, she can take an additional amiodarone dose. If this does not resolve her symptoms with an hour so, she should then call for help.  She is not on full endocrine relation due to history of falls, and GI bleed etc. Continue with aspirin alone.

## 2017-02-02 ENCOUNTER — Telehealth: Payer: Self-pay | Admitting: Cardiology

## 2017-02-02 NOTE — Telephone Encounter (Signed)
New message       *STAT* If patient is at the pharmacy, call can be transferred to refill team.   1. Which medications need to be refilled? (please list name of each medication and dose if known) nitro 2. Which pharmacy/location (including street and city if local pharmacy) is medication to be sent to? [facility] [address]  3. Do they need a 30 day or 90 day supply? [place]

## 2017-02-02 NOTE — Telephone Encounter (Signed)
Pt gson notified that new rx was sent 01-11-2017 #[id number]. He will call pharmacy and see status of this rx

## 2017-02-05 ENCOUNTER — Other Ambulatory Visit: Payer: Self-pay | Admitting: *Deleted

## 2017-02-05 ENCOUNTER — Other Ambulatory Visit: Payer: Self-pay | Admitting: Cardiology

## 2017-02-05 MED ORDER — NITROGLYCERIN 0.4 MG SL SUBL: 0.4000 mg | SUBLINGUAL_TABLET | SUBLINGUAL | 3 refills | Status: AC | PRN

## 2017-02-19 DIAGNOSIS — N390 Urinary tract infection, site not specified: Secondary | ICD-10-CM | POA: Diagnosis not present

## 2017-02-19 DIAGNOSIS — I129 Hypertensive chronic kidney disease with stage 1 through stage 4 chronic kidney disease, or unspecified chronic kidney disease: Secondary | ICD-10-CM | POA: Diagnosis not present

## 2017-02-19 DIAGNOSIS — E039 Hypothyroidism, unspecified: Secondary | ICD-10-CM | POA: Diagnosis not present

## 2017-02-19 DIAGNOSIS — E785 Hyperlipidemia, unspecified: Secondary | ICD-10-CM | POA: Diagnosis not present

## 2017-02-19 DIAGNOSIS — N184 Chronic kidney disease, stage 4 (severe): Secondary | ICD-10-CM | POA: Diagnosis not present

## 2017-02-26 DIAGNOSIS — N390 Urinary tract infection, site not specified: Secondary | ICD-10-CM | POA: Diagnosis not present

## 2017-02-26 DIAGNOSIS — E785 Hyperlipidemia, unspecified: Secondary | ICD-10-CM | POA: Diagnosis not present

## 2017-02-26 DIAGNOSIS — I129 Hypertensive chronic kidney disease with stage 1 through stage 4 chronic kidney disease, or unspecified chronic kidney disease: Secondary | ICD-10-CM | POA: Diagnosis not present

## 2017-02-26 DIAGNOSIS — I2510 Atherosclerotic heart disease of native coronary artery without angina pectoris: Secondary | ICD-10-CM | POA: Diagnosis not present

## 2017-03-12 ENCOUNTER — Other Ambulatory Visit: Payer: Self-pay | Admitting: Cardiology

## 2017-03-12 NOTE — Telephone Encounter (Signed)
REFILL 

## 2017-03-28 ENCOUNTER — Encounter: Payer: Self-pay | Admitting: Cardiology

## 2017-03-28 ENCOUNTER — Ambulatory Visit (INDEPENDENT_AMBULATORY_CARE_PROVIDER_SITE_OTHER): Payer: Medicare Other | Admitting: Cardiology

## 2017-03-28 VITALS — BP 142/60 | HR 47 | Ht 66.0 in | Wt 131.6 lb

## 2017-03-28 DIAGNOSIS — Z9861 Coronary angioplasty status: Secondary | ICD-10-CM | POA: Diagnosis not present

## 2017-03-28 DIAGNOSIS — E785 Hyperlipidemia, unspecified: Secondary | ICD-10-CM

## 2017-03-28 DIAGNOSIS — I480 Paroxysmal atrial fibrillation: Secondary | ICD-10-CM

## 2017-03-28 DIAGNOSIS — I2510 Atherosclerotic heart disease of native coronary artery without angina pectoris: Secondary | ICD-10-CM

## 2017-03-28 DIAGNOSIS — R002 Palpitations: Secondary | ICD-10-CM | POA: Diagnosis not present

## 2017-03-28 DIAGNOSIS — I10 Essential (primary) hypertension: Secondary | ICD-10-CM

## 2017-03-28 DIAGNOSIS — Z8679 Personal history of other diseases of the circulatory system: Secondary | ICD-10-CM

## 2017-03-28 NOTE — Patient Instructions (Addendum)
MEDICATION ON THE NIGHT YOU DO NOT USE AMBIEN , TAKE METOPROLOL ABOUT 6 PM.  FOR YOUR SWELLING MAY USE LASIX (fUROSEMIDE) 2-3 DAYS IN A ROW UNTIL SWELLING GOES DOWN.     Your physician wants you to follow-up in [place]. You will receive a reminder letter in the mail two months in advance. If you don't receive a letter, please call our office to schedule the follow-up appointment.  If you need a refill on your cardiac medications before your next appointment, please call your pharmacy.

## 2017-03-28 NOTE — Progress Notes (Signed)
PCP: Thressa Sheller, MD  Clinic Note: Chief Complaint  Patient presents with  . Chest Pain    pt states having some palpatations and a lot of chest pain  . Shortness of Breath    some  . Edema    both ankles and feet     HPI: Jeanne Hart is a 83 y.o. female with a PMH below who presents today for Four-month follow-up. She has CAD-PCI 2 as well as PAF did not take warfarin because of bleeding issues..3 Last cardiac monitor and stress test were April 2017  Jeanne Hart was last seen on 11/17/2016 -> she was doing relatively well at that time, just notes that most of her symptoms relate to when she does not take her Ambien for sleep. I stopped her long-standing atenolol dose in lieu of continuing amiodarone and increasing amlodipine to 5 mg. She is now on when necessary metoprolol.  Recent Hospitalizations: None  Studies Personally Reviewed - (if available, images/films reviewed: From Epic Chart or Care Everywhere)  None  Interval History: Jeanne Hart presents today overall feeling okay. No real change. Certainly better than she was. She still is sleeping well at night on the nights that she doesn't take her Ambien. On those days she feels her heart racing when she lies down she has palpitations and shortness of breath. She however denies significant exertional dyspnea or chest pain. She is able to walk down to the dining hall without too much difficulty. She is a relatively poor historian, and her grandson has to help pull things out of her.  She is pretty much taking her metoprolol almost every night that she doesn't take Ambien, and notes some relief with that. She has swelling in her ankles the go down over the course of the evening. She really has not had any syncope or near syncope type symptoms. No TIA or amaurosis fugax symptoms. She does get a little dizzy with poor balance with walking, but no near syncope.  When I ask her about her chest pain, she really denies having chest  pain she has aches and pains throughout her chest, but nothing that seems anginal in nature. This is similar to what she has described before.  No melena, hematochezia, hematuria, or epstaxis. No claudication.  ROS: A comprehensive was performed. Review of Systems  Constitutional: Negative for malaise/fatigue.  HENT: Negative for congestion and nosebleeds.   Respiratory: Negative for cough, sputum production, shortness of breath and wheezing.   Cardiovascular: Positive for palpitations (Really only on non-Ambien nights).       Per history of present illness  Gastrointestinal: Negative for blood in stool, constipation and melena.  Genitourinary: Negative for flank pain and hematuria.  Musculoskeletal: Positive for back pain and joint pain. Negative for falls (No recent falls).  Neurological: Positive for dizziness (Usually positional) and weakness (Generalized weakness with poor balance). Negative for focal weakness.  Endo/Heme/Allergies: Negative for environmental allergies.  Psychiatric/Behavioral: Positive for memory loss. Negative for depression. The patient is nervous/anxious and has insomnia (Really needs to take the Ambien. When she doesn't take it she doesn't sleep).   All other systems reviewed and are negative.   I have reviewed and (if needed) personally updated the patient's problem list, medications, allergies, past medical and surgical history, social and family history.   Past Medical History:  Diagnosis Date  . CAD S/P percutaneous coronary angioplasty    PCI x 2; once at [facility] and once in [place];; Myoview April 2017:  LOW RISK. EF 63%. No ischemia or infarction.  . Diverticulitis   . Dyslipidemia   . Dyspnea on exertion   . Family history of cardiovascular disease   . GERD (gastroesophageal reflux disease)   . Hiatal hernia   . HTN (hypertension)   . Hypothyroidism   . IBS (irritable bowel syndrome)   . Insomnia    chronic  . PAF (paroxysmal atrial  fibrillation) (HCC)    CHA2DS2VASC = 4.  coumadin was stopped secondary to GI Bleed, on amiodarone; monitor march 2014-[facility]    Past Surgical History:  Procedure Laterality Date  . ABDOMINAL HYSTERECTOMY    . APPENDECTOMY    . CHOLECYSTECTOMY    . CORONARY ANGIOPLASTY WITH STENT PLACEMENT  01/05/2011   2.5x30mm Promus DES stent to RCA post dilated to 2.75x59mm   . CORONARY ANGIOPLASTY WITH STENT PLACEMENT  ?   3 stents per patient  . NM MYOVIEW LTD  April 2017   LOW RISK. EF 60%. No ischemia or infarction.    Cardiac Event Monitor April-May 2017: Mostly sinus rhythm with rates 40-80. Rare PVCs noted. Relatively normal otherwise  Current Meds  Medication Sig  . amiodarone (PACERONE) 200 MG tablet TAKE 1 TABLET BY MOUTH ONCE DAILY AND 1 TABLET AS NEEDED FOR FAST HEART RATE  . amLODipine (NORVASC) 5 MG tablet Take 1 tablet by mouth daily. take 1 tab dialy  . Ascorbic Acid (VITAMIN C PO) Take 1 tablet by mouth daily.   Marland Kitchen aspirin EC 81 MG tablet Take 1 tablet (81 mg total) by mouth daily.  . Biotin 10 MG CAPS Take 1 tablet by mouth daily.   . Cholecalciferol (VITAMIN D PO) Take 1 tablet by mouth daily.   . ferrous sulfate 325 (65 FE) MG tablet Take 1 tablet (325 mg total) by mouth 2 (two) times daily with a meal.  . FLUOCINOLONE ACETONIDE SCALP 0.01 % OIL Apply 1 application topically as needed. No specific days  . furosemide (LASIX) 20 MG tablet Take 1 tablet (20 mg total) by mouth daily as needed for edema (as needed for leg swelling.).  Marland Kitchen isosorbide mononitrate (IMDUR) 30 MG 24 hr tablet Take 1 tablet (30 mg total) by mouth daily.  Marland Kitchen ketoconazole (NIZORAL) 2 % shampoo Apply 1 application topically 2 (two) times a week. Use as directed   . levothyroxine (SYNTHROID, LEVOTHROID) 100 MCG tablet Take 100 mcg by mouth daily before breakfast.  . metoprolol tartrate (LOPRESSOR) 25 MG tablet Take 25 mg by mouth daily as needed. For heart rhythm  . nitroGLYCERIN (NITROSTAT) 0.4 MG SL tablet Place  1 tablet (0.4 mg total) under the tongue every 5 (five) minutes as needed for chest pain. MAX 3 doses  . pantoprazole (PROTONIX) 40 MG tablet Take 40 mg by mouth daily.   Vladimir Faster Glycol-Propyl Glycol (SYSTANE) 0.4-0.3 % SOLN Apply 2 drops to eye at bedtime.  . potassium chloride (K-DUR) 10 MEQ tablet Take 1 tablet (10 mEq total) by mouth daily.  Marland Kitchen zolpidem (AMBIEN CR) 12.5 MG CR tablet Take 12.5 mg by mouth every other day.    Allergies  Allergen Reactions  . Nexium [Esomeprazole] Other (See Comments)    Aggravates IBS.   . Statins Other (See Comments)    Had jaundice as child.  Does tolerate current statin therapy.    . Codeine Other (See Comments)    Post-surgical reaction of unknown type.  . Morphine And Related Other (See Comments)    Unknown.    Marland Kitchen  Penicillins Other (See Comments)    Unknown. Has patient had a PCN reaction causing immediate rash, facial/tongue/throat swelling, SOB or lightheadedness with hypotension: YES Has patient had a PCN reaction causing severe rash involving mucus membranes or skin necrosis:NO Has patient had a PCN reaction that required hospitalization NO Has patient had a PCN reaction occurring within the last 10 years: NO If all of the above answers are "NO", then may proceed with Cephalosporin use.     . Sulfa Antibiotics Other (See Comments)    Unknown childhood reaction.      Social History   Social History  . Marital status: Widowed    Spouse name: N/A  . Number of children: 3  . Years of education: N/A   Occupational History  . Sales/Office-retired    Social History Main Topics  . Smoking status: Never Smoker  . Smokeless tobacco: Never Used  . Alcohol use No  . Drug use: No  . Sexual activity: Not Asked   Other Topics Concern  . None   Social History Narrative  . None    family history includes Diabetes in her maternal aunt and son; Stroke in her mother.  Wt Readings from Last 3 Encounters:  03/28/17 131 lb 9.6 oz (59.7  kg)  11/17/16 137 lb 3.2 oz (62.2 kg)  10/20/16 134 lb 6.4 oz (61 kg)    PHYSICAL EXAM BP (!) 142/60   Pulse (!) 47   Ht 5\' 6"  (1.676 m)   Wt 131 lb 9.6 oz (59.7 kg)   BMI 21.24 kg/m  General appearance: alert, cooperative, appears stated age, no distress. Well-nourished, well-groomed. Healthy-appearingC/AT, EOMI, MMM, anicteric sclera Neck: no adenopathy, no carotid bruit and no JVD Lungs: clear to auscultation bilaterally, normal percussion bilaterally and non-labored [place]ast: RRR with some ectopy. Normal S1 and S2. +S4. No M/R/G Abdomen: soft, non-tender; bowel sounds normal; no masses,  no organomegaly; no HJR  Extremities: extremities normal, atraumatic, no cyanosis, and  trivial / trace edema  Pulses: 2+ and symmetric; n/a Skin: no evidence of bleeding or bruising and no lesions noted  Neurologic: Mental status: Alert & oriented x 3, thought content appropriate; non-focal exam.  Pleasant mood & affect. Hard of hearing. Poor historian     Adult ECG Report  Rate: 47  ;  Rhythm: sinus bradycardia and Voltages as it with LVH. Nonspecific ST and T-wave changes/repolarization changes;   Narrative Interpretation:  stable EKG    Other studies Reviewed: Additional studies/ records that were reviewed today include:  Recent Labs:  that usually followed by PCP Lab Results  Component Value Date   CREATININE 1.40 (H) 10/19/2016   BUN 20 10/19/2016   NA 142 10/19/2016   K 3.5 10/19/2016   CL 105 10/19/2016   CO2 24 10/19/2016   No results found for: CHOL, HDL, LDLCALC, LDLDIRECT, TRIG, CHOLHDL   ASSESSMENT / PLAN: Problem List Items Addressed This Visit    CAD S/P percutaneous coronary angioplasty - Primary (Chronic)    No active anginal symptoms as long as I known her. She remains on aspirin but is not on Plavix for fear of bleeding issues. She is on low-dose Imdur for antianginal effect. We weaned off her standing dose of beta blocker because of bradycardia. Now I'm having her  take the metoprolol as sustaining dose the evenings that she does not use Ambien. We are using amlodipine for antianginal effect. Because of intolerance, she is not taking a statin.      Relevant  Orders   EKG 12-Lead (Completed)   Essential hypertension (Chronic)    For her this is actually pretty stable blood pressure I don't want be much more aggressive lipid management for fear of orthostatic hypotension. She should be fine taking her 25 mg metoprolol on the evening that she does not take Ambien. Would prefer not to titrate amlodipine over this time.      History of unstable angina (Chronic)    No further anginal symptoms on Imdur and amlodipine. No longer on beta blocker for reasons described elsewhere      Hyperlipidemia with target LDL less than 70 (Chronic)    On pravastatin. Monitored by PCP. No myalgias.      Palpitations (Chronic)    Palpitations seem to be more associated with anxiety and insomnia. Hopefully by taking the metoprolol on her non-Ambien nights this will improve. She is on amiodarone and doesn't seem having breakthroughs of A. fib.      Relevant Orders   EKG 12-Lead (Completed)   Paroxysmal atrial fibrillation [facility]): Not on Anticoagulation 2/2 GIB history.  CHA2DS2VASC =4 (Chronic)    She also notes fleeting episodes of palpitations, I doubt this is a recurrence of her A. fib. She remains on amiodarone for rhythm control. We stopped her long-standing beta blocker for bradycardia purposes Remains somewhat bradycardic relatively asymptomatic.  Because of falls, we have decided in multiple conversations and discussions with her and her grandson to simply use aspirin as opposed to full anticoagulation. Thankfully she has not had any recent falls, but this is still a concern.  Continue when necessary low-dose beta blocker for rate control, however this is not effective, would only consider going to the ER if his symptoms persist. Usually don't last long enough for  her to be concerned.      Relevant Orders   EKG 12-Lead (Completed)      Current medicines are reviewed at length with the patient today. (+/- concerns) None The following changes have been made: see below   Patient Instructions  MEDICATION ON THE NIGHT YOU DO NOT USE AMBIEN , TAKE METOPROLOL ABOUT 6 PM.  FOR YOUR SWELLING MAY USE LASIX (fUROSEMIDE) 2-3 DAYS IN A ROW UNTIL SWELLING GOES DOWN.     Your physician wants you to follow-up in [place]. You will receive a reminder letter in the mail two months in advance. If you don't receive a letter, please call our office to schedule the follow-up appointment.  If you need a refill on your cardiac medications before your next appointment, please call your pharmacy.    Studies Ordered:   Orders Placed This Encounter  Procedures  . EKG 12-Lead      Glenetta Hew, M.D., M.S. Interventional Cardiologist   Pager # [phone] Phone # [phone] [address] [place], [place] [address]

## 2017-03-29 DIAGNOSIS — M25561 Pain in right knee: Secondary | ICD-10-CM | POA: Diagnosis not present

## 2017-04-03 ENCOUNTER — Encounter: Payer: Self-pay | Admitting: Cardiology

## 2017-04-03 NOTE — Assessment & Plan Note (Signed)
Palpitations seem to be more associated with anxiety and insomnia. Hopefully by taking the metoprolol on her non-Ambien nights this will improve. She is on amiodarone and doesn't seem having breakthroughs of A. fib.

## 2017-04-03 NOTE — Assessment & Plan Note (Signed)
No active anginal symptoms as long as I known her. She remains on aspirin but is not on Plavix for fear of bleeding issues. She is on low-dose Imdur for antianginal effect. We weaned off her standing dose of beta blocker because of bradycardia. Now I'm having her take the metoprolol as sustaining dose the evenings that she does not use Ambien. We are using amlodipine for antianginal effect. Because of intolerance, she is not taking a statin.

## 2017-04-03 NOTE — Assessment & Plan Note (Signed)
She also notes fleeting episodes of palpitations, I doubt this is a recurrence of her A. fib. She remains on amiodarone for rhythm control. We stopped her long-standing beta blocker for bradycardia purposes Remains somewhat bradycardic relatively asymptomatic.  Because of falls, we have decided in multiple conversations and discussions with her and her grandson to simply use aspirin as opposed to full anticoagulation. Thankfully she has not had any recent falls, but this is still a concern.  Continue when necessary low-dose beta blocker for rate control, however this is not effective, would only consider going to the ER if his symptoms persist. Usually don't last long enough for her to be concerned.

## 2017-04-03 NOTE — Assessment & Plan Note (Signed)
For her this is actually pretty stable blood pressure I don't want be much more aggressive lipid management for fear of orthostatic hypotension. She should be fine taking her 25 mg metoprolol on the evening that she does not take Ambien. Would prefer not to titrate amlodipine over this time.

## 2017-04-03 NOTE — Assessment & Plan Note (Signed)
On pravastatin. Monitored by PCP. No myalgias.

## 2017-04-03 NOTE — Assessment & Plan Note (Signed)
No further anginal symptoms on Imdur and amlodipine. No longer on beta blocker for reasons described elsewhere

## 2017-06-16 ENCOUNTER — Other Ambulatory Visit: Payer: Self-pay | Admitting: Cardiology

## 2017-07-02 ENCOUNTER — Other Ambulatory Visit: Payer: Self-pay | Admitting: Cardiology

## 2017-07-02 NOTE — Telephone Encounter (Signed)
Called patient regarding refill request for atenolol. Explained this med was stopped in Jan 2018 per chart review and per MD last note, on PRN b-blocker d/t bradycardia. Patient voiced understanding. She reports she feels her heart racing when she lies down. She does not know what her BP or HR is during these times. Advised it would be helpful for her to try and record these readings during these episodes.   Routed to MD/RN

## 2017-07-02 NOTE — Telephone Encounter (Signed)
New message     *STAT* If patient is at the pharmacy, call can be transferred to refill team.   1. Which medications need to be refilled? (please list name of each medication and dose if known) Atenolol  2. Which pharmacy/location (including street and city if local pharmacy) is medication to be sent to? [facility] [place] [address]  3. Do they need a 30 day or 90 day supply? [place]

## 2017-07-03 NOTE — Telephone Encounter (Signed)
I would prefer to do short acting beta blocker like metoprolol tartrate 12.5 mg when necessary - less long-acting, therefore less likely to lead to cardiac. I would be able to see her back after she's tried this for a few weeks. -- ROV ~ 35month

## 2017-07-04 ENCOUNTER — Telehealth: Payer: Self-pay | Admitting: Cardiology

## 2017-07-04 NOTE — Telephone Encounter (Signed)
New message    Pt grandson is calling about pt medications. He said she is out of medication.   *STAT* If patient is at the pharmacy, call can be transferred to refill team.   1. Which medications need to be refilled? (please list name of each medication and dose if known) atenolol   2. Which pharmacy/location (including street and city if local pharmacy) is medication to be sent to? [facility] on [address] and [address]  3. Do they need a 30 day or 90 day supply? [place]

## 2017-07-05 NOTE — Telephone Encounter (Signed)
Spoke to patient- patient did not seem to understand instruction that were given. She states to call grandson  To information and instructions.   left message to call back .

## 2017-07-05 NOTE — Telephone Encounter (Signed)
Follow up    Pt grandson is returning [place] call

## 2017-07-05 NOTE — Telephone Encounter (Signed)
Spoke to patient- patient did not seem to understand instruction that were given. She states to call grandson  To information and instructions.

## 2017-07-05 NOTE — Telephone Encounter (Signed)
SPOKE GRANDSON  JOEY.  ATENOLOL WILL NOT BE FILLED . PATIENT IS TO USE METOPROLOL 12.5 MG AS NEEDED PER DR HARDING.   JOEY STATES PATIENT HAS SYMPTOMS ON THE DAYS SHE DOES NOT USE AMBIEN WHICH IS EVERY OTHER NIGHT.  SINCE PATIENT WILL BECOME CONFUSED BY MEDICATION. CHANGE JOEY WILL PLACE MEDICATION IN PILL CONTAINER ON DAYS PATIENT DOES NOT TAKE AMBIEN. AWARE WILL MOVE F/U APPOINTMENT. TO 08/10/17 AT 9:40 AM  JOEY WILL CALL BACK  IN PRESCRIPTION IS NEEDED.

## 2017-07-12 ENCOUNTER — Telehealth: Payer: Self-pay | Admitting: Cardiology

## 2017-07-12 MED ORDER — FUROSEMIDE 20 MG PO TABS: 20.0000 mg | ORAL_TABLET | Freq: Every day | ORAL | 3 refills | Status: DC | PRN

## 2017-07-12 NOTE — Telephone Encounter (Signed)
Refill sent to the pharmacy electronically.  

## 2017-07-12 NOTE — Telephone Encounter (Signed)
New Message      *STAT* If patient is at the pharmacy, call can be transferred to refill team.   1. Which medications need to be refilled? (please list name of each medication and dose if known)  furosemide (LASIX) 20 MG tablet Take 1 tablet (20 mg total) by mouth daily as needed for edema (as needed for leg swelling.).     2. Which pharmacy/location (including street and city if local pharmacy) is medication to be sent to? [facility] [address] and [address]  3. Do they need a 30 day or 90 day supply? [place]

## 2017-08-10 ENCOUNTER — Ambulatory Visit: Payer: Medicare Other | Admitting: Cardiology

## 2017-08-21 DIAGNOSIS — Z23 Encounter for immunization: Secondary | ICD-10-CM | POA: Diagnosis not present

## 2017-08-23 ENCOUNTER — Encounter: Payer: Self-pay | Admitting: Cardiology

## 2017-08-23 ENCOUNTER — Ambulatory Visit (INDEPENDENT_AMBULATORY_CARE_PROVIDER_SITE_OTHER): Payer: Medicare Other | Admitting: Cardiology

## 2017-08-23 ENCOUNTER — Other Ambulatory Visit: Payer: Self-pay

## 2017-08-23 VITALS — BP 148/66 | HR 60 | Ht 66.0 in | Wt 135.0 lb

## 2017-08-23 DIAGNOSIS — I10 Essential (primary) hypertension: Secondary | ICD-10-CM

## 2017-08-23 DIAGNOSIS — M7989 Other specified soft tissue disorders: Secondary | ICD-10-CM

## 2017-08-23 DIAGNOSIS — R002 Palpitations: Secondary | ICD-10-CM

## 2017-08-23 DIAGNOSIS — E785 Hyperlipidemia, unspecified: Secondary | ICD-10-CM | POA: Diagnosis not present

## 2017-08-23 DIAGNOSIS — I2510 Atherosclerotic heart disease of native coronary artery without angina pectoris: Secondary | ICD-10-CM | POA: Diagnosis not present

## 2017-08-23 DIAGNOSIS — Z9861 Coronary angioplasty status: Secondary | ICD-10-CM

## 2017-08-23 DIAGNOSIS — I480 Paroxysmal atrial fibrillation: Secondary | ICD-10-CM | POA: Diagnosis not present

## 2017-08-23 MED ORDER — METOPROLOL TARTRATE 25 MG PO TABS: ORAL_TABLET | ORAL | 3 refills | Status: DC

## 2017-08-23 MED ORDER — FUROSEMIDE 20 MG PO TABS: ORAL_TABLET | ORAL | 3 refills | Status: DC

## 2017-08-23 NOTE — Progress Notes (Addendum)
PCP: Thressa Sheller, MD  Clinic Note: Chief Complaint  Patient presents with  . Follow-up    SWELLING IN LEGS   . Atrial Fibrillation    HPI: Jeanne Hart is a 83 y.o. female with a PMH below who presents today for 27-month follow-up. She has CAD-PCI 2 as well as PAF did not take warfarin because of bleeding issues. Last cardiac monitor and stress test were April 2017  TASHA DIAZ was last seen on March 28, 2017:-> Overall feeling better.  Sleeping better at night except for when she does not take her Ambien. MEDICATION  ON THE NIGHT YOU DO NOT USE AMBIEN , TAKE METOPROLOL ABOUT 6 PM. (seems to work) FOR YOUR SWELLING  MAY USE LASIX (FUROSEMIDE) 2-3 DAYS IN A ROW UNTIL SWELLING GOES DOWN.  Recent Hospitalizations: None  Studies Personally Reviewed - (if available, images/films reviewed: From Epic Chart or Care Everywhere)  None  Interval History: Jeanne Hart presents today a bit more confused than usual.  She is a little bit less forthcoming with information and changes her story --still requires the help of her grandson to tell a story..  She says that she still has palpitations, but they occur at different times during the day now.  Sometimes they occur in the morning that usually in the evening.  She actually has been taken one half the dose of metoprolol every day in the evenings.  She still notes when she does not take her Ambien having palpitations but will also feel a heart relation palpitations happening earlier on the day as well.  She has no PND orthopnea, still does have edema.  She is taking 20 mg of Lasix as needed -but is not always taking it when she has swelling.  Of late she has been taking it more frequently and has not had as good a brisk result.  She describes having some chest discomfort off and on but not all associated with any particular activity.  It seems more to do with muscular skeletal issues. She denies any syncope or near syncope.  No TIA or amaurosis  fugax symptoms.  She has has balance issues and has to walk with a walker.  She has had several near falls but has not fully fallen.  She generally feels weak.  No melena, hematochezia, hematuria, or epistaxis.  No claudication.   ROS: A comprehensive was performed. Review of Systems  Constitutional: Negative for malaise/fatigue.  HENT: Negative for congestion and nosebleeds.   Respiratory: Negative for cough, sputum production, shortness of breath and wheezing.   Cardiovascular: Positive for palpitations (Really only on non-Ambien nights).       Per history of present illness  Gastrointestinal: Negative for blood in stool, constipation and melena.  Genitourinary: Negative for flank pain and hematuria.  Musculoskeletal: Positive for back pain and joint pain. Negative for falls (No recent falls).  Neurological: Positive for dizziness (Usually positional) and weakness (Generalized weakness with poor balance). Negative for focal weakness.  Endo/Heme/Allergies: Negative for environmental allergies.  Psychiatric/Behavioral: Positive for memory loss. Negative for depression. The patient is nervous/anxious and has insomnia (She sleeps well when she takes Ambien. When she doesn't take it she doesn't sleep).        Occasionally confused  All other systems reviewed and are negative.   I have reviewed and (if needed) personally updated the patient's problem list, medications, allergies, past medical and surgical history, social and family history.   Past Medical History:  Diagnosis Date  .  CAD S/P percutaneous coronary angioplasty    PCI x 2; once at [facility] and once in [place];; Myoview April 2017: LOW RISK. EF 63%. No ischemia or infarction.  . Diverticulitis   . Dyslipidemia   . Dyspnea on exertion   . Family history of cardiovascular disease   . GERD (gastroesophageal reflux disease)   . Hiatal hernia   . HTN (hypertension)   . Hypothyroidism   . IBS (irritable bowel syndrome)   .  Insomnia    chronic  . PAF (paroxysmal atrial fibrillation) (HCC)    CHA2DS2VASC = 4.  coumadin was stopped secondary to GI Bleed, on amiodarone; monitor march 2014-[facility]    Past Surgical History:  Procedure Laterality Date  . ABDOMINAL HYSTERECTOMY    . APPENDECTOMY    . CHOLECYSTECTOMY    . CORONARY ANGIOPLASTY WITH STENT PLACEMENT  01/05/2011   2.5x52mm Promus DES stent to RCA post dilated to 2.75x81mm   . CORONARY ANGIOPLASTY WITH STENT PLACEMENT  ?   3 stents per patient  . NM MYOVIEW LTD  April 2017   LOW RISK. EF 60%. No ischemia or infarction.    Cardiac Event Monitor April-May 2017: Mostly sinus rhythm with rates 40-80. Rare PVCs noted. Relatively normal otherwise  Current Meds  Medication Sig  . amiodarone (PACERONE) 200 MG tablet TAKE 1 TABLET BY MOUTH ONCE DAILY AND 1 TABLET AS NEEDED FOR FAST HEART RATE  . amLODipine (NORVASC) 5 MG tablet Take 1 tablet by mouth daily. take 1 tab dialy  . Ascorbic Acid (VITAMIN C PO) Take 1 tablet by mouth daily.   Marland Kitchen aspirin EC 81 MG tablet Take 1 tablet (81 mg total) by mouth daily.  . Biotin 10 MG CAPS Take 1 tablet by mouth daily.   . Cholecalciferol (VITAMIN D PO) Take 1 tablet by mouth daily.   Marland Kitchen FLUOCINOLONE ACETONIDE SCALP 0.01 % OIL Apply 1 application topically as needed. No specific days  . isosorbide mononitrate (IMDUR) 30 MG 24 hr tablet Take 1 tablet (30 mg total) by mouth daily.  Marland Kitchen ketoconazole (NIZORAL) 2 % shampoo Apply 1 application topically 2 (two) times a week. Use as directed   . levothyroxine (SYNTHROID, LEVOTHROID) 100 MCG tablet Take 100 mcg by mouth daily before breakfast.  . nitroGLYCERIN (NITROSTAT) 0.4 MG SL tablet Place 1 tablet (0.4 mg total) under the tongue every 5 (five) minutes as needed for chest pain. MAX 3 doses  . pantoprazole (PROTONIX) 40 MG tablet Take 40 mg by mouth daily.   Vladimir Faster Glycol-Propyl Glycol (SYSTANE) 0.4-0.3 % SOLN Apply 2 drops to eye at bedtime.  . potassium chloride (K-DUR) 10  MEQ tablet Take 1 tablet (10 mEq total) by mouth daily. (Patient taking differently: Take 10 mEq as needed by mouth. )  . zolpidem (AMBIEN CR) 12.5 MG CR tablet Take 12.5 mg by mouth every other day.  . [DISCONTINUED] furosemide (LASIX) 20 MG tablet Take 1 tablet (20 mg total) by mouth daily as needed for edema (as needed for leg swelling.).  . [DISCONTINUED] metoprolol tartrate (LOPRESSOR) 25 MG tablet Take 25 mg by mouth daily as needed. For heart rhythm    Allergies  Allergen Reactions  . Nexium [Esomeprazole] Other (See Comments)    Aggravates IBS.   . Statins Other (See Comments)    Had jaundice as child.  Does tolerate current statin therapy.    . Codeine Other (See Comments)    Post-surgical reaction of unknown type.  . Morphine And Related  Other (See Comments)    Unknown.    Marland Kitchen Penicillins Other (See Comments)    Unknown. Has patient had a PCN reaction causing immediate rash, facial/tongue/throat swelling, SOB or lightheadedness with hypotension: YES Has patient had a PCN reaction causing severe rash involving mucus membranes or skin necrosis:NO Has patient had a PCN reaction that required hospitalization NO Has patient had a PCN reaction occurring within the last 10 years: NO If all of the above answers are "NO", then may proceed with Cephalosporin use.     . Sulfa Antibiotics Other (See Comments)    Unknown childhood reaction.      Social History   Socioeconomic History  . Marital status: Widowed    Spouse name: None  . Number of children: 3  . Years of education: None  . Highest education level: None  Social Needs  . Financial resource strain: None  . Food insecurity - worry: None  . Food insecurity - inability: None  . Transportation needs - medical: None  . Transportation needs - non-medical: None  Occupational History  . Occupation: Sales/Office-retired  Tobacco Use  . Smoking status: Never Smoker  . Smokeless tobacco: Never Used  Substance and Sexual  Activity  . Alcohol use: No  . Drug use: No  . Sexual activity: None  Other Topics Concern  . None  Social History Narrative  . None    family history includes Diabetes in her maternal aunt and son; Heart disease in her unknown relative; Stroke in her mother.  Wt Readings from Last 3 Encounters:  08/23/17 135 lb (61.2 kg)  03/28/17 131 lb 9.6 oz (59.7 kg)  11/17/16 137 lb 3.2 oz (62.2 kg)    PHYSICAL EXAM BP (!) 148/66   Pulse 60   Ht 5\' 6"  (1.676 m)   Wt 135 lb (61.2 kg)   BMI 21.79 kg/m   Physical Exam General appearance: alert, cooperative, appears stated age, no distress. Well-nourished, well-groomed. Healthy-appearingC/AT, EOMI, MMM, anicteric sclera Neck: no adenopathy, no carotid bruit and no JVD Lungs: clear to auscultation bilaterally, normal percussion bilaterally and non-labored Heast: RRR with some ectopy. Normal S1 and S2. +S4. No M/R/G Abdomen: soft, non-tender; bowel sounds normal; no masses,  no organomegaly; no HJR  Extremities: extremities normal, atraumatic, no cyanosis, and  trivial / trace edema  Pulses: 2+ and symmetric; n/a Skin: no evidence of bleeding or bruising and no lesions noted  Neurologic: Mental status: Alert & oriented x 3, thought content appropriate; non-focal exam.  Pleasant mood & affect. Hard of hearing. Poor historian     Adult ECG Report  Rate: 60;  Rhythm: normal sinus rhythm and Voltages as it with LVH. Nonspecific ST and T-wave changes/repolarization changes;   Narrative Interpretation:  stable EKG    Other studies Reviewed: Additional studies/ records that were reviewed today include:  Recent Labs:  that usually followed by PCP Lab Results  Component Value Date   CREATININE 1.40 (H) 10/19/2016   BUN 20 10/19/2016   NA 142 10/19/2016   K 3.5 10/19/2016   CL 105 10/19/2016   CO2 24 10/19/2016   No results found for: CHOL, HDL, LDLCALC, LDLDIRECT, TRIG, CHOLHDL   ASSESSMENT / PLAN: Problem List Items Addressed This  Visit    CAD S/P percutaneous coronary angioplasty (Chronic)    Distant history of PCI.  Now only on nitrate and beta-blocker.  Is on aspirin She has not complained of any recurrent anginal symptoms since starting Imdur and maintaining a beta-blocker  on board. She is not on statin due to intolerance.  At her age I would not start aggressive therapy.      Relevant Medications   furosemide (LASIX) 20 MG tablet   Other Relevant Orders   EKG 12-Lead (Completed)   Essential hypertension (Chronic)    Allowing for some mild permissive hypertension.   She is doing well with stable dose of amlodipine and mild low-dose beta-blocker.  Would not titrate further without good cause.      Relevant Medications   furosemide (LASIX) 20 MG tablet   Foot swelling    She is now noticing a little bit more swelling than usual although she has been really taking Lasix that much.  Of late she has been taking 20 mg daily, but has not noted that it fully goes down. I explained to her that she can take an additional 20 mg making a total of 40 mg if necessary.  Plan for now: LASIX ( FUROSEMIDE)  FOR THE NEXT 3 DAYS TAKE 2 (20 MG) FUROSEMIDE THEN TAKE ONE TABLET 20 MG  A DAY, IF NEED MAY TAKE AN ADDITIONAL  20 MG FUROSEMIDE      Relevant Orders   EKG 12-Lead (Completed)   Hyperlipidemia with target LDL less than 70 (Chronic)    She is previously on pravastatin, no longer on it.  Was stopped.  Labs to be monitored by PCP.      Relevant Medications   furosemide (LASIX) 20 MG tablet   Palpitations (Chronic)    I am not sure if these episodes are truly related to A. fib or simply related to anxiety. Seems like they are all going along with her not taking Ambien at night.  She remains on amiodarone for A. fib rhythm control.  We are using a standing dose of metoprolol once daily with as needed additional dose for rapid palpitations.      Paroxysmal atrial fibrillation [facility]): Not on Anticoagulation 2/2 GIB  history.  CHA2DS2VASC =4 - Primary (Chronic)    She still has fleeting episodes of palpitations that seem to be worse on the nights that she is not taking Ambien.  She is on amiodarone for rhythm control.  If she has a significant episode I have recommended that she take as needed dose of amiodarone.  Otherwise Bumex to half dose of metoprolol for rate control. She is not on anticoagulants because of history of bleeding.  METOPROLOL TARTRATE  12.5 MG  IN EVENING , IF NEED MAY TAKE AN EXTRA  12.5 MG OR 25 MG  IF NEED FOR PALP. OR THE DAY YOU DO NOT TAKE AMBIEN.      Relevant Medications   furosemide (LASIX) 20 MG tablet   Other Relevant Orders   EKG 12-Lead (Completed)      Current medicines are reviewed at length with the patient today. (+/- concerns) None The following changes have been made: see below   Patient Instructions  Medication   LASIX ( FUROSEMIDE)  FOR THE NEXT 3 DAYS TAKE 2 (20 MG) FUROSEMIDE THEN TAKE ONE TABLET 20 MG  A DAY, IF NEED MAY TAKE AN ADDITIONAL  20 MG FUROSEMIDE  METOPROLOL TARTRATE  12.5 MG  IN EVENING , IF NEED MAY TAKE AN EXTRA  12.5 MG OR 25 MG  IF NEED FOR PALP. OR THE DAY YOU DO NOT TAKE AMBIEN.     Your physician wants you to follow-up in [place]. You will receive a reminder letter  in the mail two months in advance. If you don't receive a letter, please call our office to schedule the follow-up appointment.     Studies Ordered:   Orders Placed This Encounter  Procedures  . EKG 12-Lead      Glenetta Hew, M.D., M.S. Interventional Cardiologist   Pager # [phone] Phone # [phone] [address] [place], [place] [address]

## 2017-08-23 NOTE — Patient Instructions (Addendum)
Medication   LASIX ( FUROSEMIDE)  FOR THE NEXT 3 DAYS TAKE 2 (20 MG) FUROSEMIDE THEN TAKE ONE TABLET 20 MG  A DAY, IF NEED MAY TAKE AN ADDITIONAL  20 MG FUROSEMIDE  METOPROLOL TARTRATE  12.5 MG  IN EVENING , IF NEED MAY TAKE AN EXTRA  12.5 MG OR 25 MG  IF NEED FOR PALP. OR THE DAY YOU DO NOT TAKE AMBIEN.     Your physician wants you to follow-up in [place]. You will receive a reminder letter in the mail two months in advance. If you don't receive a letter, please call our office to schedule the follow-up appointment.

## 2017-08-24 MED ORDER — METOPROLOL TARTRATE 25 MG PO TABS: 25.0000 mg | ORAL_TABLET | Freq: Every day | ORAL | 3 refills | Status: DC | PRN

## 2017-08-25 ENCOUNTER — Encounter: Payer: Self-pay | Admitting: Cardiology

## 2017-08-26 ENCOUNTER — Encounter: Payer: Self-pay | Admitting: Cardiology

## 2017-08-26 NOTE — Assessment & Plan Note (Signed)
She is previously on pravastatin, no longer on it.  Was stopped.  Labs to be monitored by PCP.

## 2017-08-26 NOTE — Assessment & Plan Note (Signed)
Allowing for some mild permissive hypertension.   She is doing well with stable dose of amlodipine and mild low-dose beta-blocker.  Would not titrate further without good cause.

## 2017-08-26 NOTE — Assessment & Plan Note (Signed)
She is now noticing a little bit more swelling than usual although she has been really taking Lasix that much.  Of late she has been taking 20 mg daily, but has not noted that it fully goes down. I explained to her that she can take an additional 20 mg making a total of 40 mg if necessary.  Plan for now: LASIX ( FUROSEMIDE)  FOR THE NEXT 3 DAYS TAKE 2 (20 MG) FUROSEMIDE THEN TAKE ONE TABLET 20 MG  A DAY, IF NEED MAY TAKE AN ADDITIONAL  20 MG FUROSEMIDE

## 2017-08-26 NOTE — Assessment & Plan Note (Signed)
I am not sure if these episodes are truly related to A. fib or simply related to anxiety. Seems like they are all going along with her not taking Ambien at night.  She remains on amiodarone for A. fib rhythm control.  We are using a standing dose of metoprolol once daily with as needed additional dose for rapid palpitations.

## 2017-08-26 NOTE — Assessment & Plan Note (Signed)
Distant history of PCI.  Now only on nitrate and beta-blocker.  Is on aspirin She has not complained of any recurrent anginal symptoms since starting Imdur and maintaining a beta-blocker on board. She is not on statin due to intolerance.  At her age I would not start aggressive therapy.

## 2017-08-26 NOTE — Assessment & Plan Note (Signed)
She still has fleeting episodes of palpitations that seem to be worse on the nights that she is not taking Ambien.  She is on amiodarone for rhythm control.  If she has a significant episode I have recommended that she take as needed dose of amiodarone.  Otherwise Bumex to half dose of metoprolol for rate control. She is not on anticoagulants because of history of bleeding.  METOPROLOL TARTRATE  12.5 MG  IN EVENING , IF NEED MAY TAKE AN EXTRA  12.5 MG OR 25 MG  IF NEED FOR PALP. OR THE DAY YOU DO NOT TAKE AMBIEN.

## 2017-08-28 DIAGNOSIS — H903 Sensorineural hearing loss, bilateral: Secondary | ICD-10-CM | POA: Diagnosis not present

## 2017-09-25 DIAGNOSIS — E785 Hyperlipidemia, unspecified: Secondary | ICD-10-CM | POA: Diagnosis not present

## 2017-09-25 DIAGNOSIS — Z0000 Encounter for general adult medical examination without abnormal findings: Secondary | ICD-10-CM | POA: Diagnosis not present

## 2017-09-25 DIAGNOSIS — I2510 Atherosclerotic heart disease of native coronary artery without angina pectoris: Secondary | ICD-10-CM | POA: Diagnosis not present

## 2017-09-25 DIAGNOSIS — I10 Essential (primary) hypertension: Secondary | ICD-10-CM | POA: Diagnosis not present

## 2017-09-25 DIAGNOSIS — I129 Hypertensive chronic kidney disease with stage 1 through stage 4 chronic kidney disease, or unspecified chronic kidney disease: Secondary | ICD-10-CM | POA: Diagnosis not present

## 2017-09-25 DIAGNOSIS — N390 Urinary tract infection, site not specified: Secondary | ICD-10-CM | POA: Diagnosis not present

## 2017-09-25 DIAGNOSIS — Z79899 Other long term (current) drug therapy: Secondary | ICD-10-CM | POA: Diagnosis not present

## 2017-09-25 DIAGNOSIS — N184 Chronic kidney disease, stage 4 (severe): Secondary | ICD-10-CM | POA: Diagnosis not present

## 2017-09-25 DIAGNOSIS — E039 Hypothyroidism, unspecified: Secondary | ICD-10-CM | POA: Diagnosis not present

## 2017-10-08 DIAGNOSIS — D692 Other nonthrombocytopenic purpura: Secondary | ICD-10-CM | POA: Diagnosis not present

## 2017-10-08 DIAGNOSIS — I2510 Atherosclerotic heart disease of native coronary artery without angina pectoris: Secondary | ICD-10-CM | POA: Diagnosis not present

## 2017-10-08 DIAGNOSIS — I129 Hypertensive chronic kidney disease with stage 1 through stage 4 chronic kidney disease, or unspecified chronic kidney disease: Secondary | ICD-10-CM | POA: Diagnosis not present

## 2017-10-08 DIAGNOSIS — I4891 Unspecified atrial fibrillation: Secondary | ICD-10-CM | POA: Diagnosis not present

## 2017-10-08 DIAGNOSIS — E785 Hyperlipidemia, unspecified: Secondary | ICD-10-CM | POA: Diagnosis not present

## 2017-10-08 DIAGNOSIS — I209 Angina pectoris, unspecified: Secondary | ICD-10-CM | POA: Diagnosis not present

## 2017-10-08 DIAGNOSIS — E039 Hypothyroidism, unspecified: Secondary | ICD-10-CM | POA: Diagnosis not present

## 2017-10-08 DIAGNOSIS — D709 Neutropenia, unspecified: Secondary | ICD-10-CM | POA: Diagnosis not present

## 2017-10-08 DIAGNOSIS — M810 Age-related osteoporosis without current pathological fracture: Secondary | ICD-10-CM | POA: Diagnosis not present

## 2017-10-08 DIAGNOSIS — K219 Gastro-esophageal reflux disease without esophagitis: Secondary | ICD-10-CM | POA: Diagnosis not present

## 2017-10-08 DIAGNOSIS — N184 Chronic kidney disease, stage 4 (severe): Secondary | ICD-10-CM | POA: Diagnosis not present

## 2017-11-26 DIAGNOSIS — M545 Low back pain: Secondary | ICD-10-CM | POA: Diagnosis not present

## 2017-11-26 DIAGNOSIS — M47816 Spondylosis without myelopathy or radiculopathy, lumbar region: Secondary | ICD-10-CM | POA: Diagnosis not present

## 2017-11-29 DIAGNOSIS — M25522 Pain in left elbow: Secondary | ICD-10-CM | POA: Diagnosis not present

## 2017-11-29 DIAGNOSIS — S0083XA Contusion of other part of head, initial encounter: Secondary | ICD-10-CM | POA: Diagnosis not present

## 2017-12-01 ENCOUNTER — Emergency Department (HOSPITAL_COMMUNITY)
Admission: EM | Admit: 2017-12-01 | Discharge: 2017-12-01 | Disposition: A | Discharge status: Home / Self Care | Payer: Medicare Other | Attending: Emergency Medicine | Admitting: Emergency Medicine

## 2017-12-01 ENCOUNTER — Other Ambulatory Visit: Payer: Self-pay

## 2017-12-01 ENCOUNTER — Emergency Department (HOSPITAL_COMMUNITY): Payer: Medicare Other

## 2017-12-01 ENCOUNTER — Encounter (HOSPITAL_COMMUNITY): Payer: Self-pay

## 2017-12-01 DIAGNOSIS — E039 Hypothyroidism, unspecified: Secondary | ICD-10-CM | POA: Insufficient documentation

## 2017-12-01 DIAGNOSIS — R531 Weakness: Secondary | ICD-10-CM | POA: Diagnosis not present

## 2017-12-01 DIAGNOSIS — S0990XA Unspecified injury of head, initial encounter: Secondary | ICD-10-CM | POA: Diagnosis not present

## 2017-12-01 DIAGNOSIS — N183 Chronic kidney disease, stage 3 (moderate): Secondary | ICD-10-CM | POA: Insufficient documentation

## 2017-12-01 DIAGNOSIS — Z79899 Other long term (current) drug therapy: Secondary | ICD-10-CM | POA: Insufficient documentation

## 2017-12-01 DIAGNOSIS — S5002XA Contusion of left elbow, initial encounter: Secondary | ICD-10-CM | POA: Diagnosis not present

## 2017-12-01 DIAGNOSIS — Z7982 Long term (current) use of aspirin: Secondary | ICD-10-CM | POA: Diagnosis not present

## 2017-12-01 DIAGNOSIS — I129 Hypertensive chronic kidney disease with stage 1 through stage 4 chronic kidney disease, or unspecified chronic kidney disease: Secondary | ICD-10-CM | POA: Insufficient documentation

## 2017-12-01 DIAGNOSIS — Y999 Unspecified external cause status: Secondary | ICD-10-CM | POA: Insufficient documentation

## 2017-12-01 DIAGNOSIS — Y929 Unspecified place or not applicable: Secondary | ICD-10-CM | POA: Insufficient documentation

## 2017-12-01 DIAGNOSIS — W19XXXA Unspecified fall, initial encounter: Secondary | ICD-10-CM

## 2017-12-01 DIAGNOSIS — R404 Transient alteration of awareness: Secondary | ICD-10-CM | POA: Diagnosis not present

## 2017-12-01 DIAGNOSIS — Y939 Activity, unspecified: Secondary | ICD-10-CM | POA: Insufficient documentation

## 2017-12-01 DIAGNOSIS — S098XXA Other specified injuries of head, initial encounter: Secondary | ICD-10-CM | POA: Diagnosis not present

## 2017-12-01 DIAGNOSIS — W0110XA Fall on same level from slipping, tripping and stumbling with subsequent striking against unspecified object, initial encounter: Secondary | ICD-10-CM | POA: Insufficient documentation

## 2017-12-01 MED ORDER — ACETAMINOPHEN 500 MG PO TABS: 1000.0000 mg | ORAL_TABLET | Freq: Once | ORAL | Status: DC

## 2017-12-01 NOTE — ED Notes (Signed)
Patient transported to CT/XRay.

## 2017-12-01 NOTE — ED Triage Notes (Signed)
Pt from [facility]- EMS states pt was found on floor after an unwitnessed fall today. Pt states she was coming back from bathroom trying to sit down in chair when she fell. Denies dizziness prior to fall. Pt does use walker. A/Ox4, moving all extremities.

## 2017-12-01 NOTE — ED Notes (Signed)
Bed: [facility] Expected date:  Expected time:  Means of arrival:  Comments: 

## 2017-12-01 NOTE — ED Provider Notes (Signed)
[facility] DEPT Provider Note   CSN: [id number] Arrival date & time: 12/01/17  1609     History   Chief Complaint Chief Complaint  Patient presents with  . Fall    HPI Jeanne Hart is a 84 y.o. female.  84 yo F with a chief complaint of a fall.  The patient states that she was trying to sit on a chair and she missed and fell on the floor.  States that she struck the back of her head.  Also struck her left elbow.  Initially refused to come to the hospital but her son eventually convinced her to come.  Patient lives in independent living at a nursing home.  Has had some increasing amount of falls that she is not sure exactly how many the pain.  She thinks it is secondary to low back pain that is been chronic since a remote accident.  Denies any infectious symptoms.  Denies loss of consciousness.  Denies vomiting.  Denies confusion.   The history is provided by the patient.  Illness  This is a new problem. The current episode started 1 to 2 hours ago. The problem occurs constantly. The problem has not changed since onset.Pertinent negatives include no chest pain, no headaches and no shortness of breath. Nothing aggravates the symptoms. Nothing relieves the symptoms. She has tried nothing for the symptoms. The treatment provided no relief.    Past Medical History:  Diagnosis Date  . CAD S/P percutaneous coronary angioplasty    PCI x 2; once at [facility] and once in [place];; Myoview April 2017: LOW RISK. EF 63%. No ischemia or infarction.  . Diverticulitis   . Dyslipidemia   . Dyspnea on exertion   . Family history of cardiovascular disease   . GERD (gastroesophageal reflux disease)   . Hiatal hernia   . HTN (hypertension)   . Hypothyroidism   . IBS (irritable bowel syndrome)   . Insomnia    chronic  . PAF (paroxysmal atrial fibrillation) (HCC)    CHA2DS2VASC = 4.  coumadin was stopped secondary to GI Bleed, on amiodarone; monitor march  2014-[facility]    Patient Active Problem List   Diagnosis Date Noted  . Weakness generalized 10/19/2016  . CKD (chronic kidney disease) stage 3, GFR 30-59 ml/min (HCC) 10/19/2016  . Elevated LFTs 10/19/2016  . Thrombocytopenia ([place]) 10/19/2016  . Nonspecific abnormal finding in stool contents 05/30/2015  . Foot swelling 02/18/2015  . DOE (dyspnea on exertion) 12/10/2014  . CAD S/P percutaneous coronary angioplasty   . Heme positive stool 04/03/2014  . Anemia, unspecified 04/03/2014  . Diarrhea 04/03/2014  . History of unstable angina 03/23/2014  . Hypothyroidism 03/23/2014  . Essential hypertension 04/29/2013  . Hyperlipidemia with target LDL less than 70 04/29/2013  . Paroxysmal atrial fibrillation [facility]): Not on Anticoagulation 2/2 GIB history.  CHA2DS2VASC =4 04/29/2013  . History of GI bleed 04/29/2013  . Palpitations 04/29/2013    Past Surgical History:  Procedure Laterality Date  . ABDOMINAL HYSTERECTOMY    . APPENDECTOMY    . CHOLECYSTECTOMY    . CORONARY ANGIOPLASTY WITH STENT PLACEMENT  01/05/2011   2.5x66mm Promus DES stent to RCA post dilated to 2.75x76mm   . CORONARY ANGIOPLASTY WITH STENT PLACEMENT  ?   3 stents per patient  . NM MYOVIEW LTD  April 2017   LOW RISK. EF 60%. No ischemia or infarction.    OB History    No data available  Home Medications    Prior to Admission medications   Medication Sig Start Date End Date Taking? Authorizing Provider  amiodarone (PACERONE) 200 MG tablet TAKE 1 TABLET BY MOUTH ONCE DAILY AND 1 TABLET AS NEEDED FOR FAST HEART RATE 06/18/17  Yes Leonie Man, MD  amLODipine (NORVASC) 5 MG tablet Take 1 tablet by mouth daily. take 1 tab dialy 05/07/15  Yes [provider]  Ascorbic Acid (VITAMIN C PO) Take 1 tablet by mouth daily.    Yes [provider]  aspirin EC 81 MG tablet Take 1 tablet (81 mg total) by mouth daily. 02/15/15  Yes Leonie Man, MD  Cholecalciferol (VITAMIN D PO) Take 1 tablet by mouth  daily.    Yes [provider]  furosemide (LASIX) 20 MG tablet Take 2 tablets of 20 mg or 3 days then take 20 mg one tablet daily ,may take an extra 20 mg if needed for swelling 08/23/17  Yes Leonie Man, MD  isosorbide mononitrate (IMDUR) 30 MG 24 hr tablet Take 1 tablet (30 mg total) by mouth daily. 09/09/14  Yes Harris, Abigail, PA-C  levothyroxine (SYNTHROID, LEVOTHROID) 100 MCG tablet Take 100 mcg by mouth daily before breakfast.   Yes [provider]  meloxicam (MOBIC) 7.5 MG tablet Take 7.5 mg by mouth 2 (two) times daily. 11/29/17  Yes [provider]  metoprolol tartrate (LOPRESSOR) 25 MG tablet Take 1 tablet (25 mg total) daily as needed by mouth. For heart rhythm Patient taking differently: Take 12.5-25 mg by mouth daily as needed (heart rhythm). For heart rhythm 08/24/17  Yes Leonie Man, MD  naproxen sodium (ALEVE) 220 MG tablet Take 220 mg by mouth daily as needed (pain).   Yes [provider]  nitroGLYCERIN (NITROSTAT) 0.4 MG SL tablet Place 1 tablet (0.4 mg total) under the tongue every 5 (five) minutes as needed for chest pain. MAX 3 doses 02/05/17  Yes Leonie Man, MD  pantoprazole (PROTONIX) 40 MG tablet Take 40 mg by mouth daily.    Yes [provider]  Polyethyl Glycol-Propyl Glycol (SYSTANE) 0.4-0.3 % SOLN Apply 2 drops to eye at bedtime.   Yes [provider]  potassium chloride (K-DUR) 10 MEQ tablet Take 1 tablet (10 mEq total) by mouth daily. 03/05/16  Yes Maryan Puls, MD  predniSONE (DELTASONE) 10 MG tablet Take 10 mg by mouth See admin instructions. tk 4 ts po qd x 3 days then decrease 1 t every day until completed 11/26/17  Yes [provider]  Solifenacin Succinate (VESICARE PO) Take 1 tablet by mouth daily.   Yes [provider]  UNABLE TO FIND Inject 1 each into the muscle once. Cortisone Injection and Prednisone Injection on Monday 11/26/17. Both injections were in the hip (1 in each hip)    Yes [provider]  zolpidem (AMBIEN CR) 12.5 MG CR tablet Take 12.5 mg by mouth every other day.   Yes [provider]  ferrous sulfate 325 (65 FE) MG tablet Take 1 tablet (325 mg total) by mouth 2 (two) times daily with a meal. Patient not taking: Reported on 08/23/2017 10/20/16   Rama, Venetia Maxon, MD    Family History Family History  Problem Relation Age of Onset  . Stroke Mother   . Diabetes Maternal Aunt   . Diabetes Son   . Heart disease Unknown        Both sides of family  . Colon cancer Neg Hx  Social History Social History   Tobacco Use  . Smoking status: Never Smoker  . Smokeless tobacco: Never Used  Substance Use Topics  . Alcohol use: No  . Drug use: No     Allergies   Nexium [esomeprazole]; Statins; Codeine; Morphine and related; Penicillins; and Sulfa antibiotics   Review of Systems Review of Systems  Respiratory: Negative for shortness of breath.   Cardiovascular: Negative for chest pain.  Neurological: Negative for headaches.     Physical Exam Updated Vital Signs BP (!) 179/90 (BP Location: Left Arm)   Pulse 60   Temp 97.7 F (36.5 C) (Oral)   Resp 16   SpO2 97%   Physical Exam  Constitutional: She is oriented to person, place, and time. She appears well-developed and well-nourished. No distress.  HENT:  Head: Normocephalic.  Small hematoma to the right occiput, old bruise to L frontal region  Eyes: EOM are normal. Pupils are equal, round, and reactive to light.  Neck: Normal range of motion. Neck supple.  Cardiovascular: Normal rate and regular rhythm. Exam reveals no gallop and no friction rub.  No murmur heard. Pulmonary/Chest: Effort normal. She has no wheezes. She has no rales.  Abdominal: Soft. She exhibits no distension and no mass. There is no tenderness. There is no guarding.  Musculoskeletal: She exhibits no edema or tenderness.  Scattered bruises of different ages along both forearms.  Complaints of mild  pain to the left olecranon process. Full ROM.  PMS intact distally.   Neurological: She is alert and oriented to person, place, and time.  Skin: Skin is warm and dry. She is not diaphoretic.  Psychiatric: She has a normal mood and affect. Her behavior is normal.  Nursing note and vitals reviewed.    ED Treatments / Results  Labs (all labs ordered are listed, but only abnormal results are displayed) Labs Reviewed - No data to display  EKG  EKG Interpretation None       Radiology Dg Elbow Complete Left  Result Date: 12/01/2017 CLINICAL DATA:  unwitnessed fall today. Pt states she was coming back from bathroom trying to sit down in chair when she fell. Bruising to left elbow noted. EXAM: LEFT ELBOW - COMPLETE 3+ VIEW COMPARISON:  None. FINDINGS: No fracture.  No bone lesion. The elbow joint is normally spaced and aligned. No degenerative/arthropathic change. No joint effusion. Soft tissues are unremarkable. IMPRESSION: Negative. Electronically Signed   By: Lajean Manes M.D.   On: 12/01/2017 17:02   Ct Head Wo Contrast  Result Date: 12/01/2017 CLINICAL DATA:  84 year old female with head trauma. EXAM: CT HEAD WITHOUT CONTRAST TECHNIQUE: Contiguous axial images were obtained from the base of the skull through the vertex without intravenous contrast. COMPARISON:  None. FINDINGS: Brain: No evidence of acute infarction, hemorrhage, hydrocephalus, extra-axial collection or mass lesion/mass effect. Atrophy and chronic small-vessel white matter ischemic changes noted. Vascular: Atherosclerotic calcifications noted. Skull: Normal. Negative for fracture or focal lesion. Sinuses/Orbits: No acute abnormality Other: None. IMPRESSION: 1. No evidence of acute intracranial abnormality 2. Atrophy and chronic small-vessel white matter ischemic changes. Electronically Signed   By: Margarette Canada M.D.   On: 12/01/2017 17:09    Procedures Procedures (including critical care time)  Medications Ordered in  ED Medications  acetaminophen (TYLENOL) tablet 1,000 mg (not administered)     Initial Impression / Assessment and Plan / ED Course  I have reviewed the triage vital signs and the nursing notes.  Pertinent labs & imaging results that  were available during my care of the patient were reviewed by me and considered in my medical decision making (see chart for details).     84 yo F with a chief complaint of a fall.  This happened earlier today.  The patient had a mechanical sounding fall.  She has signs of trauma to the head.  Will obtain a CT of the head.  Plain film of the left elbow.  Images negative.  D/c home.   5:52 PM:  I have discussed the diagnosis/risks/treatment options with the patient and family and believe the pt to be eligible for discharge home to follow-up with PCP. We also discussed returning to the ED immediately if new or worsening sx occur. We discussed the sx which are most concerning (e.g., sudden worsening pain, fever, inability to tolerate by mouth) that necessitate immediate return. Medications administered to the patient during their visit and any new prescriptions provided to the patient are listed below.  Medications given during this visit Medications  acetaminophen (TYLENOL) tablet 1,000 mg (not administered)     The patient appears reasonably screen and/or stabilized for discharge and I doubt any other medical condition or other [facility] requiring further screening, evaluation, or treatment in the ED at this time prior to discharge.    Final Clinical Impressions(s) / ED Diagnoses   Final diagnoses:  Fall, initial encounter    ED Discharge Orders    None       Deno Etienne, DO 12/01/17 1752

## 2017-12-01 NOTE — ED Notes (Signed)
Pt discharged safely with grandson.  Discharge instructions were reviewed.  Pt was in no distress at discharge.  Pt was wheeled out in wheelchair.

## 2017-12-06 ENCOUNTER — Inpatient Hospital Stay (HOSPITAL_COMMUNITY)
Admission: EM | Admit: 2017-12-06 | Discharge: 2017-12-10 | DRG: 683 | Disposition: A | Discharge status: Skilled Nursing Facility | Payer: Medicare Other | Attending: Internal Medicine | Admitting: Internal Medicine

## 2017-12-06 ENCOUNTER — Emergency Department (HOSPITAL_COMMUNITY): Payer: Medicare Other

## 2017-12-06 ENCOUNTER — Other Ambulatory Visit: Payer: Self-pay

## 2017-12-06 ENCOUNTER — Encounter (HOSPITAL_COMMUNITY): Payer: Self-pay

## 2017-12-06 DIAGNOSIS — N179 Acute kidney failure, unspecified: Secondary | ICD-10-CM | POA: Diagnosis not present

## 2017-12-06 DIAGNOSIS — R7989 Other specified abnormal findings of blood chemistry: Secondary | ICD-10-CM

## 2017-12-06 DIAGNOSIS — S79911A Unspecified injury of right hip, initial encounter: Secondary | ICD-10-CM | POA: Diagnosis not present

## 2017-12-06 DIAGNOSIS — Z7982 Long term (current) use of aspirin: Secondary | ICD-10-CM

## 2017-12-06 DIAGNOSIS — I480 Paroxysmal atrial fibrillation: Secondary | ICD-10-CM | POA: Diagnosis not present

## 2017-12-06 DIAGNOSIS — Z955 Presence of coronary angioplasty implant and graft: Secondary | ICD-10-CM

## 2017-12-06 DIAGNOSIS — R296 Repeated falls: Secondary | ICD-10-CM

## 2017-12-06 DIAGNOSIS — E039 Hypothyroidism, unspecified: Secondary | ICD-10-CM | POA: Diagnosis not present

## 2017-12-06 DIAGNOSIS — K219 Gastro-esophageal reflux disease without esophagitis: Secondary | ICD-10-CM | POA: Diagnosis present

## 2017-12-06 DIAGNOSIS — Z8249 Family history of ischemic heart disease and other diseases of the circulatory system: Secondary | ICD-10-CM

## 2017-12-06 DIAGNOSIS — M545 Low back pain: Secondary | ICD-10-CM | POA: Diagnosis not present

## 2017-12-06 DIAGNOSIS — R945 Abnormal results of liver function studies: Secondary | ICD-10-CM

## 2017-12-06 DIAGNOSIS — I2510 Atherosclerotic heart disease of native coronary artery without angina pectoris: Secondary | ICD-10-CM

## 2017-12-06 DIAGNOSIS — E440 Moderate protein-calorie malnutrition: Secondary | ICD-10-CM | POA: Diagnosis not present

## 2017-12-06 DIAGNOSIS — S199XXA Unspecified injury of neck, initial encounter: Secondary | ICD-10-CM | POA: Diagnosis not present

## 2017-12-06 DIAGNOSIS — W19XXXA Unspecified fall, initial encounter: Secondary | ICD-10-CM

## 2017-12-06 DIAGNOSIS — I129 Hypertensive chronic kidney disease with stage 1 through stage 4 chronic kidney disease, or unspecified chronic kidney disease: Secondary | ICD-10-CM | POA: Diagnosis present

## 2017-12-06 DIAGNOSIS — N183 Chronic kidney disease, stage 3 (moderate): Secondary | ICD-10-CM | POA: Diagnosis present

## 2017-12-06 DIAGNOSIS — I10 Essential (primary) hypertension: Secondary | ICD-10-CM | POA: Diagnosis present

## 2017-12-06 DIAGNOSIS — Z90710 Acquired absence of both cervix and uterus: Secondary | ICD-10-CM

## 2017-12-06 DIAGNOSIS — K589 Irritable bowel syndrome without diarrhea: Secondary | ICD-10-CM | POA: Diagnosis present

## 2017-12-06 DIAGNOSIS — Z23 Encounter for immunization: Secondary | ICD-10-CM

## 2017-12-06 DIAGNOSIS — R634 Abnormal weight loss: Secondary | ICD-10-CM

## 2017-12-06 DIAGNOSIS — R9431 Abnormal electrocardiogram [ECG] [EKG]: Secondary | ICD-10-CM | POA: Diagnosis present

## 2017-12-06 DIAGNOSIS — S0990XA Unspecified injury of head, initial encounter: Secondary | ICD-10-CM | POA: Diagnosis not present

## 2017-12-06 DIAGNOSIS — E876 Hypokalemia: Secondary | ICD-10-CM | POA: Diagnosis present

## 2017-12-06 DIAGNOSIS — E785 Hyperlipidemia, unspecified: Secondary | ICD-10-CM | POA: Diagnosis not present

## 2017-12-06 DIAGNOSIS — E860 Dehydration: Secondary | ICD-10-CM | POA: Diagnosis not present

## 2017-12-06 DIAGNOSIS — M549 Dorsalgia, unspecified: Secondary | ICD-10-CM | POA: Diagnosis not present

## 2017-12-06 DIAGNOSIS — Z79890 Hormone replacement therapy (postmenopausal): Secondary | ICD-10-CM

## 2017-12-06 DIAGNOSIS — R52 Pain, unspecified: Secondary | ICD-10-CM | POA: Diagnosis not present

## 2017-12-06 DIAGNOSIS — Z9861 Coronary angioplasty status: Secondary | ICD-10-CM

## 2017-12-06 DIAGNOSIS — T148XXA Other injury of unspecified body region, initial encounter: Secondary | ICD-10-CM | POA: Diagnosis not present

## 2017-12-06 DIAGNOSIS — Z79899 Other long term (current) drug therapy: Secondary | ICD-10-CM

## 2017-12-06 DIAGNOSIS — F5104 Psychophysiologic insomnia: Secondary | ICD-10-CM | POA: Diagnosis present

## 2017-12-06 DIAGNOSIS — Z6822 Body mass index (BMI) 22.0-22.9, adult: Secondary | ICD-10-CM

## 2017-12-06 LAB — URINALYSIS, ROUTINE W REFLEX MICROSCOPIC
BACTERIA UA: NONE SEEN
BILIRUBIN URINE: NEGATIVE
Glucose, UA: NEGATIVE mg/dL
KETONES UR: NEGATIVE mg/dL
Nitrite: NEGATIVE
PH: 6 (ref 5.0–8.0)
Protein, ur: NEGATIVE mg/dL
SPECIFIC GRAVITY, URINE: 1.005 (ref 1.005–1.030)

## 2017-12-06 LAB — CBC WITH DIFFERENTIAL/PLATELET
BASOS PCT: 0 %
Basophils Absolute: 0 10*3/uL (ref 0.0–0.1)
EOS ABS: 0 10*3/uL (ref 0.0–0.7)
Eosinophils Relative: 0 %
HEMATOCRIT: 41 % (ref 36.0–46.0)
HEMOGLOBIN: 14.2 g/dL (ref 12.0–15.0)
Lymphocytes Relative: 8 %
Lymphs Abs: 0.7 10*3/uL (ref 0.7–4.0)
MCH: 30.5 pg (ref 26.0–34.0)
MCHC: 34.6 g/dL (ref 30.0–36.0)
MCV: 88 fL (ref 78.0–100.0)
Monocytes Absolute: 0.6 10*3/uL (ref 0.1–1.0)
Monocytes Relative: 7 %
NEUTROS ABS: 7.8 10*3/uL — AB (ref 1.7–7.7)
NEUTROS PCT: 85 %
Platelets: 249 10*3/uL (ref 150–400)
RBC: 4.66 MIL/uL (ref 3.87–5.11)
RDW: 15.2 % (ref 11.5–15.5)
WBC: 9.2 10*3/uL (ref 4.0–10.5)

## 2017-12-06 LAB — BASIC METABOLIC PANEL
ANION GAP: 11 (ref 5–15)
BUN: 55 mg/dL — ABNORMAL HIGH (ref 6–20)
CALCIUM: 9.5 mg/dL (ref 8.9–10.3)
CHLORIDE: 102 mmol/L (ref 101–111)
CO2: 27 mmol/L (ref 22–32)
CREATININE: 2.07 mg/dL — AB (ref 0.44–1.00)
GFR calc non Af Amer: 20 mL/min — ABNORMAL LOW (ref >60)
GFR, EST AFRICAN AMERICAN: 23 mL/min — AB (ref >60)
Glucose, Bld: 109 mg/dL — ABNORMAL HIGH (ref 65–99)
Potassium: 4.1 mmol/L (ref 3.5–5.1)
SODIUM: 140 mmol/L (ref 135–145)

## 2017-12-06 MED ORDER — FENTANYL CITRATE (PF) 100 MCG/2ML IJ SOLN
50.0000 ug | Freq: Once | INTRAMUSCULAR | Status: AC
  Administered 2017-12-07: 50 ug via INTRAVENOUS
  Filled 2017-12-06: qty 2

## 2017-12-06 MED ORDER — SODIUM CHLORIDE 0.9 % IV BOLUS (SEPSIS)
1000.0000 mL | Freq: Once | INTRAVENOUS | Status: AC
  Administered 2017-12-07: 1000 mL via INTRAVENOUS

## 2017-12-06 NOTE — ED Provider Notes (Signed)
[place] DEPT Provider Note   CSN: [id number] Arrival date & time: 12/06/17  1846     History   Chief Complaint Chief Complaint  Patient presents with  . Fall    HPI Jeanne Hart is a 84 y.o. female BIB [facility] from [facility] [facility] for evaluation of a fall.  Per EMS, patient was found down on the floor.  They estimate that she was on the floor for approximately 1 hour.  Patient states that she remembers falling.  She states that she was walking over from her bed to the window and thinks that she tripped on the curtain, causing her to fall.  Patient unsure if she hit her head.  Patient reports that she did not land on her buttocks.    She denies any chest pain or dizziness prior to onset of fall.  On ED arrival, patient is complaining of some neck pain and is also complaining of pain to the lower sacrum.  Patient reports that she has some right lower leg pain but states that she has had that previously prior to onset of fall.  Patient states that she has a history of falling.  Patient reports that she has had a history of back surgery that causes her back pain which sometimes makes her have difficulty walking.  Patient states that she normally has to ambulate with the assistance of a walker.  Patient denies any headache, vision changes, chest pain, difficulty breathing, abdominal pain, pain to her upper extremities.  The history is provided by the patient.    Past Medical History:  Diagnosis Date  . CAD S/P percutaneous coronary angioplasty    PCI x 2; once at [facility] and once in [place];; Myoview April 2017: LOW RISK. EF 63%. No ischemia or infarction.  . Diverticulitis   . Dyslipidemia   . Dyspnea on exertion   . Family history of cardiovascular disease   . GERD (gastroesophageal reflux disease)   . Hiatal hernia   . HTN (hypertension)   . Hypothyroidism   . IBS (irritable bowel syndrome)   . Insomnia    chronic  . PAF  (paroxysmal atrial fibrillation) (HCC)    CHA2DS2VASC = 4.  coumadin was stopped secondary to GI Bleed, on amiodarone; monitor march 2014-[facility]    Patient Active Problem List   Diagnosis Date Noted  . Weakness generalized 10/19/2016  . CKD (chronic kidney disease) stage 3, GFR 30-59 ml/min (HCC) 10/19/2016  . Elevated LFTs 10/19/2016  . Thrombocytopenia ([place]) 10/19/2016  . Nonspecific abnormal finding in stool contents 05/30/2015  . Foot swelling 02/18/2015  . DOE (dyspnea on exertion) 12/10/2014  . CAD S/P percutaneous coronary angioplasty   . Heme positive stool 04/03/2014  . Anemia, unspecified 04/03/2014  . Diarrhea 04/03/2014  . History of unstable angina 03/23/2014  . Hypothyroidism 03/23/2014  . Essential hypertension 04/29/2013  . Hyperlipidemia with target LDL less than 70 04/29/2013  . Paroxysmal atrial fibrillation [facility]): Not on Anticoagulation 2/2 GIB history.  CHA2DS2VASC =4 04/29/2013  . History of GI bleed 04/29/2013  . Palpitations 04/29/2013    Past Surgical History:  Procedure Laterality Date  . ABDOMINAL HYSTERECTOMY    . APPENDECTOMY    . CHOLECYSTECTOMY    . CORONARY ANGIOPLASTY WITH STENT PLACEMENT  01/05/2011   2.5x79mm Promus DES stent to RCA post dilated to 2.75x8mm   . CORONARY ANGIOPLASTY WITH STENT PLACEMENT  ?   3 stents per patient  . NM MYOVIEW LTD  April 2017   LOW RISK. EF 60%. No ischemia or infarction.    OB History    No data available       Home Medications    Prior to Admission medications   Medication Sig Start Date End Date Taking? Authorizing Provider  amiodarone (PACERONE) 200 MG tablet TAKE 1 TABLET BY MOUTH ONCE DAILY AND 1 TABLET AS NEEDED FOR FAST HEART RATE 06/18/17  Yes Leonie Man, MD  aspirin EC 81 MG tablet Take 1 tablet (81 mg total) by mouth daily. 02/15/15  Yes Leonie Man, MD  atenolol (TENORMIN) 25 MG tablet Take 25 mg by mouth daily.   Yes [provider]  levothyroxine (SYNTHROID, LEVOTHROID)  100 MCG tablet Take 100 mcg by mouth daily before breakfast.   Yes [provider]  metoprolol tartrate (LOPRESSOR) 25 MG tablet Take 1 tablet (25 mg total) daily as needed by mouth. For heart rhythm Patient taking differently: Take 12.5-25 mg by mouth daily as needed (heart rhythm). For heart rhythm 08/24/17  Yes Leonie Man, MD  nitroGLYCERIN (NITROSTAT) 0.4 MG SL tablet Place 1 tablet (0.4 mg total) under the tongue every 5 (five) minutes as needed for chest pain. MAX 3 doses 02/05/17  Yes Leonie Man, MD  pantoprazole (PROTONIX) 40 MG tablet Take 40 mg by mouth daily.    Yes [provider]  Polyethyl Glycol-Propyl Glycol (SYSTANE) 0.4-0.3 % SOLN Apply 2 drops to eye at bedtime.   Yes [provider]  zolpidem (AMBIEN CR) 12.5 MG CR tablet Take 12.5 mg by mouth every other day.   Yes [provider]  amLODipine (NORVASC) 5 MG tablet Take 1 tablet by mouth daily. take 1 tab dialy 05/07/15   [provider]  Ascorbic Acid (VITAMIN C PO) Take 1 tablet by mouth daily.     [provider]  Cholecalciferol (VITAMIN D PO) Take 1 tablet by mouth daily.     [provider]  ferrous sulfate 325 (65 FE) MG tablet Take 1 tablet (325 mg total) by mouth 2 (two) times daily with a meal. Patient not taking: Reported on 08/23/2017 10/20/16   Rama, Venetia Maxon, MD  furosemide (LASIX) 20 MG tablet Take 2 tablets of 20 mg or 3 days then take 20 mg one tablet daily ,may take an extra 20 mg if needed for swelling 08/23/17   Leonie Man, MD  isosorbide mononitrate (IMDUR) 30 MG 24 hr tablet Take 1 tablet (30 mg total) by mouth daily. 09/09/14   Margarita Mail, PA-C  meloxicam (MOBIC) 7.5 MG tablet Take 7.5 mg by mouth 2 (two) times daily. 11/29/17   [provider]  naproxen sodium (ALEVE) 220 MG tablet Take 220 mg by mouth daily as needed (pain).    [provider]  potassium chloride (K-DUR) 10 MEQ tablet Take 1 tablet (10 mEq  total) by mouth daily. 03/05/16   Maryan Puls, MD  Solifenacin Succinate (VESICARE PO) Take 1 tablet by mouth daily.    [provider]  UNABLE TO FIND Inject 1 each into the muscle once. Cortisone Injection and Prednisone Injection on Monday 11/26/17. Both injections were in the hip (1 in each hip)    [provider]    Family History Family History  Problem Relation Age of Onset  . Stroke Mother   . Diabetes Maternal Aunt   . Diabetes Son   . Heart disease Unknown        Both sides  of family  . Colon cancer Neg Hx     Social History Social History   Tobacco Use  . Smoking status: Never Smoker  . Smokeless tobacco: Never Used  Substance Use Topics  . Alcohol use: No  . Drug use: No     Allergies   Nexium [esomeprazole]; Statins; Azo [phenazopyridine]; Codeine; Morphine and related; Penicillins; and Sulfa antibiotics   Review of Systems Review of Systems  Constitutional: Negative for fever.  Eyes: Negative for visual disturbance.  Respiratory: Negative for cough and shortness of breath.   Cardiovascular: Negative for chest pain.  Gastrointestinal: Negative for abdominal pain, nausea and vomiting.  Genitourinary: Negative for dysuria and hematuria.  Musculoskeletal: Positive for back pain. Negative for neck pain.       RLE pain   Neurological: Negative for weakness, numbness and headaches.     Physical Exam Updated Vital Signs BP (!) 182/78   Pulse 66   Resp 18   SpO2 99%   Physical Exam  Constitutional: She is oriented to person, place, and time. She appears well-developed and well-nourished.  Frail and elderly appearing   HENT:  Head: Normocephalic and atraumatic.  Mouth/Throat: Oropharynx is clear and moist and mucous membranes are normal.  No tenderness to palpation of skull. No deformities or crepitus noted. No open wounds, abrasions or lacerations.   Eyes: Conjunctivae, EOM and lids are normal. Pupils are equal, round, and reactive  to light.  EOMs intact without difficulty  Neck: Full passive range of motion without pain.  Diffuse tenderness overlying the paraspinal muscles of the neck that extends over to the midline, notably at the C6-C7 distribution.  No step-offs, crepitus noted.  Full range of motion without any difficulty.   Cardiovascular: Normal rate, regular rhythm and normal heart sounds. Exam reveals no gallop and no friction rub.  No murmur heard. Pulses:      Radial pulses are 2+ on the right side, and 2+ on the left side.       Dorsalis pedis pulses are 2+ on the right side, and 2+ on the left side.  Pulmonary/Chest: Effort normal and breath sounds normal.  Abdominal: Soft. Normal appearance. There is no tenderness. There is no rigidity and no guarding.  Musculoskeletal: Normal range of motion.  Diffuse tenderness palpation overlying the lumbar spine.  Patient reports diffuse tenderness overlying the right lower extremity.  No bony tenderness overlying the right hip.  Flexion/extension of right lower extremity intact without difficulty.  No pain with internal or external rotation.  Neurological: She is alert and oriented to person, place, and time.  Cranial nerves III-XII intact Follows commands, Moves all extremities  5/5 strength to BUE and BLE  Sensation intact throughout all major nerve distributions Normal finger to nose. No pronator drift. No slurred speech. No facial droop.   Skin: Skin is warm and dry. Capillary refill takes less than 2 seconds.  Psychiatric: She has a normal mood and affect. Her speech is normal.  Nursing note and vitals reviewed.    ED Treatments / Results  Labs (all labs ordered are listed, but only abnormal results are displayed) Labs Reviewed  URINALYSIS, ROUTINE W REFLEX MICROSCOPIC - Abnormal; Notable for the following components:      Result Value   Hgb urine dipstick SMALL (*)    Leukocytes, UA SMALL (*)    Squamous Epithelial / LPF 0-5 (*)    All other  components within normal limits  BASIC METABOLIC PANEL - Abnormal; Notable for  the following components:   Glucose, Bld 109 (*)    BUN 55 (*)    Creatinine, Ser 2.07 (*)    GFR calc non Af Amer 20 (*)    GFR calc Af Amer 23 (*)    All other components within normal limits  CBC WITH DIFFERENTIAL/PLATELET - Abnormal; Notable for the following components:   Neutro Abs 7.8 (*)    All other components within normal limits  TSH  T4, FREE    EKG  EKG Interpretation  Date/Time:  Thursday December 06 2017 21:31:54 EST Ventricular Rate:  63 PR Interval:    QRS Duration: 114 QT Interval:  496 QTC Calculation: 508 R Axis:   63 Text Interpretation:  Sinus rhythm Borderline prolonged PR interval Incomplete left bundle branch block Probable left ventricular hypertrophy Prolonged QT interval No significant change since last tracing Confirmed by Alfonzo Beers [phone]) on 12/06/2017 10:28:02 PM       Radiology Dg Lumbar Spine Complete  Result Date: 12/06/2017 CLINICAL DATA:  Fall earlier tonight with low back pain radiating to right hip. EXAM: LUMBAR SPINE - COMPLETE 4+ VIEW COMPARISON:  11/26/2017 FINDINGS: Mild diffuse osteopenia. Subtle curvature of the lumbar spine convex left unchanged. There is mild spondylosis of the lumbar spine. Stable mild L3 compression fracture and stable moderate T12 compression fracture. No new compression fracture or spondylolisthesis. There is facet arthropathy over the lower lumbar spine. Calcified plaque over the thoracoabdominal aorta. Mild degenerative change of the hips. IMPRESSION: No acute findings. Mild spondylosis of the lumbar spine. Stable moderate T12 compression fracture and stable mild L3 compression fracture. Electronically Signed   By: Marin Olp M.D.   On: 12/06/2017 20:23   Ct Head Wo Contrast  Result Date: 12/06/2017 CLINICAL DATA:  Fall EXAM: CT HEAD WITHOUT CONTRAST CT CERVICAL SPINE WITHOUT CONTRAST TECHNIQUE: Multidetector CT imaging of the  head and cervical spine was performed following the standard protocol without intravenous contrast. Multiplanar CT image reconstructions of the cervical spine were also generated. COMPARISON:  CT brain 12/01/2017 FINDINGS: CT HEAD FINDINGS Brain: No acute territorial infarction, hemorrhage or intracranial mass. Probable old lacunar infarct left basal ganglia. Moderate small vessel ischemic changes of the white matter. Moderate atrophy. Stable ventricle size Vascular: No hyperdense vessels.  Carotid vascular calcification Skull: No fracture Sinuses/Orbits: No acute finding. Other: None CT CERVICAL SPINE FINDINGS Alignment: No subluxation.  Facet alignment within normal limits Skull base and vertebrae: No acute fracture. No primary bone lesion or focal pathologic process. Soft tissues and spinal canal: No prevertebral fluid or swelling. No visible canal hematoma. Disc levels: Moderate degenerative changes at C3-C4, C4-C5, C5-C6 and C6-C7. Multiple level bilateral foraminal stenosis, most marked on the right side at C4-C5 with moderate narrowing bilaterally at C3 C4. Upper chest: Enlarged nodular thyroid. Other: None IMPRESSION: 1. No CT evidence for acute intracranial abnormality. Atrophy and small vessel ischemic changes of the white matter 2. Moderate degenerative changes of the cervical spine without acute osseous abnormality 3. Enlarged multinodular thyroid. Electronically Signed   By: Donavan Foil M.D.   On: 12/06/2017 20:38   Ct Cervical Spine Wo Contrast  Result Date: 12/06/2017 CLINICAL DATA:  Fall EXAM: CT HEAD WITHOUT CONTRAST CT CERVICAL SPINE WITHOUT CONTRAST TECHNIQUE: Multidetector CT imaging of the head and cervical spine was performed following the standard protocol without intravenous contrast. Multiplanar CT image reconstructions of the cervical spine were also generated. COMPARISON:  CT brain 12/01/2017 FINDINGS: CT HEAD FINDINGS Brain: No acute territorial infarction, hemorrhage  or  intracranial mass. Probable old lacunar infarct left basal ganglia. Moderate small vessel ischemic changes of the white matter. Moderate atrophy. Stable ventricle size Vascular: No hyperdense vessels.  Carotid vascular calcification Skull: No fracture Sinuses/Orbits: No acute finding. Other: None CT CERVICAL SPINE FINDINGS Alignment: No subluxation.  Facet alignment within normal limits Skull base and vertebrae: No acute fracture. No primary bone lesion or focal pathologic process. Soft tissues and spinal canal: No prevertebral fluid or swelling. No visible canal hematoma. Disc levels: Moderate degenerative changes at C3-C4, C4-C5, C5-C6 and C6-C7. Multiple level bilateral foraminal stenosis, most marked on the right side at C4-C5 with moderate narrowing bilaterally at C3 C4. Upper chest: Enlarged nodular thyroid. Other: None IMPRESSION: 1. No CT evidence for acute intracranial abnormality. Atrophy and small vessel ischemic changes of the white matter 2. Moderate degenerative changes of the cervical spine without acute osseous abnormality 3. Enlarged multinodular thyroid. Electronically Signed   By: Donavan Foil M.D.   On: 12/06/2017 20:38   Dg Hip Unilat W Or Wo Pelvis 2-3 Views Right  Result Date: 12/06/2017 CLINICAL DATA:  Fall earlier tonight with low back and right hip pain. EXAM: DG HIP (WITH OR WITHOUT PELVIS) 2-3V RIGHT COMPARISON:  03/05/2016 FINDINGS: There is mild diffuse decreased bone mineralization. Minimal symmetric degenerative change of the hips. No evidence of acute fracture or dislocation. Pelvic phleboliths. Degenerate change of the spine. IMPRESSION: No acute findings. Electronically Signed   By: Marin Olp M.D.   On: 12/06/2017 20:24    Procedures Procedures (including critical care time)  Medications Ordered in ED Medications  fentaNYL (SUBLIMAZE) injection 50 mcg (50 mcg Intravenous Given 12/07/17 0035)  sodium chloride 0.9 % bolus 1,000 mL (1,000 mLs Intravenous New Bag/Given  12/07/17 0029)     Initial Impression / Assessment and Plan / ED Course  I have reviewed the triage vital signs and the nursing notes.  Pertinent labs & imaging results that were available during my care of the patient were reviewed by me and considered in my medical decision making (see chart for details).     84 year old female who presents for evaluation of a fall that occurred this afternoon.  Patient lives at a assisted living center but states that she has her own apartment.  She has a caretaker come in for a few hours a day.  Per EMS, patient had a caretaker with her and then reports that they left.  Caretaker came back approximately 1 hour later and found patient on the floor.  Patient reports that she was walking over to the bed to the window and got tripped up and fell.  She reports that she has used a walker to ambulate.  On ED arrival, patient is complaining of some back pain and right lower leg pain, though the leg pain appears to be chronic.  Patient reports that she has a history of back pain that makes it difficult for her to walk.  She reports that she has been falling more frequently in the last few weeks.  Plan for CT head, CT C spine imaging and lumbar and right hip XR for further evaluation. Plan to check basic labs, UA.   CT head negative for any acute abnormalities.  CT C-spine is negative for any acute abnormalities.  Lumbar x-ray shows chronic compression fractures of the lumbar and thoracic spine.  No new fractures noted.  Right lower extremity is negative for any hip fracture.  UA is negative for any acute infectious etiology.  CBC is without any acute abnormalities.  BMP shows bump in BUN and creatinine.  BUN is 55 and creatinine is 2.07.  This is a bump from patient's baseline which is 1.40.  Ambulated patient with the assistance of the RN. Patient was unable to ambulate on her own and required both of [place] holding onto her to ambulate. Patient almost fell several times. She  is able to bear weight on the RLE. Do not suspect occult fracture at this time. Difficulty ambulating could be chronic but could also be secondary to dehydration. Given AKI and difficulty ambulating, will plan to admit.   Discussed patient with Dr. Tamala Julian (Hospitalist). Will admit. He would like a TSH and Free T4.   Final Clinical Impressions(s) / ED Diagnoses   Final diagnoses:  Acute kidney injury [facility])  Fall, initial encounter  Dehydration    ED Discharge Orders    None       Volanda Napoleon, PA-C 12/07/17 0045    Pixie Casino, MD 12/07/17 [phone]

## 2017-12-06 NOTE — ED Triage Notes (Signed)
Per [facility]- Pt resides at [place] [facility]. Pt found on floor. Pt states she went down to pick up bed and went down on right side. Pt states she was down on floor for 1 hour. No LOC. Denies neck and back pain. HX of recent falls. Pt c/o of "sore bottom" from scooting on floor. Found by a person checking on her.

## 2017-12-06 NOTE — ED Notes (Signed)
EDPA Provider at bedside. 

## 2017-12-06 NOTE — ED Notes (Signed)
Bed: [facility] Expected date:  Expected time:  Means of arrival:  Comments: EMS-fall 

## 2017-12-06 NOTE — ED Notes (Signed)
Pt family refused tech  to ambulated the pt in the room informed the nurse

## 2017-12-07 ENCOUNTER — Other Ambulatory Visit: Payer: Self-pay

## 2017-12-07 DIAGNOSIS — I2510 Atherosclerotic heart disease of native coronary artery without angina pectoris: Secondary | ICD-10-CM | POA: Diagnosis not present

## 2017-12-07 DIAGNOSIS — R9431 Abnormal electrocardiogram [ECG] [EKG]: Secondary | ICD-10-CM | POA: Diagnosis not present

## 2017-12-07 DIAGNOSIS — I340 Nonrheumatic mitral (valve) insufficiency: Secondary | ICD-10-CM | POA: Diagnosis not present

## 2017-12-07 DIAGNOSIS — Z7982 Long term (current) use of aspirin: Secondary | ICD-10-CM | POA: Diagnosis not present

## 2017-12-07 DIAGNOSIS — N179 Acute kidney failure, unspecified: Secondary | ICD-10-CM | POA: Diagnosis present

## 2017-12-07 DIAGNOSIS — Z79890 Hormone replacement therapy (postmenopausal): Secondary | ICD-10-CM | POA: Diagnosis not present

## 2017-12-07 DIAGNOSIS — K5730 Diverticulosis of large intestine without perforation or abscess without bleeding: Secondary | ICD-10-CM | POA: Diagnosis not present

## 2017-12-07 DIAGNOSIS — Z6822 Body mass index (BMI) 22.0-22.9, adult: Secondary | ICD-10-CM | POA: Diagnosis not present

## 2017-12-07 DIAGNOSIS — J90 Pleural effusion, not elsewhere classified: Secondary | ICD-10-CM | POA: Diagnosis not present

## 2017-12-07 DIAGNOSIS — I10 Essential (primary) hypertension: Secondary | ICD-10-CM | POA: Diagnosis not present

## 2017-12-07 DIAGNOSIS — Z8249 Family history of ischemic heart disease and other diseases of the circulatory system: Secondary | ICD-10-CM | POA: Diagnosis not present

## 2017-12-07 DIAGNOSIS — S299XXA Unspecified injury of thorax, initial encounter: Secondary | ICD-10-CM | POA: Diagnosis not present

## 2017-12-07 DIAGNOSIS — R634 Abnormal weight loss: Secondary | ICD-10-CM | POA: Diagnosis not present

## 2017-12-07 DIAGNOSIS — E860 Dehydration: Secondary | ICD-10-CM | POA: Diagnosis not present

## 2017-12-07 DIAGNOSIS — E876 Hypokalemia: Secondary | ICD-10-CM | POA: Diagnosis present

## 2017-12-07 DIAGNOSIS — R945 Abnormal results of liver function studies: Secondary | ICD-10-CM | POA: Diagnosis not present

## 2017-12-07 DIAGNOSIS — Z79899 Other long term (current) drug therapy: Secondary | ICD-10-CM | POA: Diagnosis not present

## 2017-12-07 DIAGNOSIS — Z9861 Coronary angioplasty status: Secondary | ICD-10-CM | POA: Diagnosis not present

## 2017-12-07 DIAGNOSIS — E039 Hypothyroidism, unspecified: Secondary | ICD-10-CM | POA: Diagnosis not present

## 2017-12-07 DIAGNOSIS — K219 Gastro-esophageal reflux disease without esophagitis: Secondary | ICD-10-CM | POA: Diagnosis present

## 2017-12-07 DIAGNOSIS — D631 Anemia in chronic kidney disease: Secondary | ICD-10-CM | POA: Diagnosis not present

## 2017-12-07 DIAGNOSIS — E440 Moderate protein-calorie malnutrition: Secondary | ICD-10-CM | POA: Diagnosis not present

## 2017-12-07 DIAGNOSIS — F5104 Psychophysiologic insomnia: Secondary | ICD-10-CM | POA: Diagnosis present

## 2017-12-07 DIAGNOSIS — I129 Hypertensive chronic kidney disease with stage 1 through stage 4 chronic kidney disease, or unspecified chronic kidney disease: Secondary | ICD-10-CM | POA: Diagnosis present

## 2017-12-07 DIAGNOSIS — K589 Irritable bowel syndrome without diarrhea: Secondary | ICD-10-CM | POA: Diagnosis present

## 2017-12-07 DIAGNOSIS — Z955 Presence of coronary angioplasty implant and graft: Secondary | ICD-10-CM | POA: Diagnosis not present

## 2017-12-07 DIAGNOSIS — R296 Repeated falls: Secondary | ICD-10-CM

## 2017-12-07 DIAGNOSIS — W19XXXA Unspecified fall, initial encounter: Secondary | ICD-10-CM | POA: Diagnosis not present

## 2017-12-07 DIAGNOSIS — M6281 Muscle weakness (generalized): Secondary | ICD-10-CM | POA: Diagnosis not present

## 2017-12-07 DIAGNOSIS — Z23 Encounter for immunization: Secondary | ICD-10-CM | POA: Diagnosis not present

## 2017-12-07 DIAGNOSIS — K449 Diaphragmatic hernia without obstruction or gangrene: Secondary | ICD-10-CM | POA: Diagnosis not present

## 2017-12-07 DIAGNOSIS — S3210XA Unspecified fracture of sacrum, initial encounter for closed fracture: Secondary | ICD-10-CM | POA: Diagnosis not present

## 2017-12-07 DIAGNOSIS — I480 Paroxysmal atrial fibrillation: Secondary | ICD-10-CM | POA: Diagnosis not present

## 2017-12-07 DIAGNOSIS — J189 Pneumonia, unspecified organism: Secondary | ICD-10-CM

## 2017-12-07 DIAGNOSIS — E785 Hyperlipidemia, unspecified: Secondary | ICD-10-CM | POA: Diagnosis present

## 2017-12-07 DIAGNOSIS — Z90710 Acquired absence of both cervix and uterus: Secondary | ICD-10-CM | POA: Diagnosis not present

## 2017-12-07 DIAGNOSIS — N183 Chronic kidney disease, stage 3 (moderate): Secondary | ICD-10-CM | POA: Diagnosis present

## 2017-12-07 HISTORY — DX: Pneumonia, unspecified organism: J18.9

## 2017-12-07 LAB — CBC
HEMATOCRIT: 37.5 % (ref 36.0–46.0)
HEMOGLOBIN: 12.4 g/dL (ref 12.0–15.0)
MCH: 29.7 pg (ref 26.0–34.0)
MCHC: 33.1 g/dL (ref 30.0–36.0)
MCV: 89.7 fL (ref 78.0–100.0)
Platelets: 214 10*3/uL (ref 150–400)
RBC: 4.18 MIL/uL (ref 3.87–5.11)
RDW: 15.1 % (ref 11.5–15.5)
WBC: 7.5 10*3/uL (ref 4.0–10.5)

## 2017-12-07 LAB — T4, FREE: FREE T4: 1.29 ng/dL — AB (ref 0.61–1.12)

## 2017-12-07 LAB — BASIC METABOLIC PANEL
ANION GAP: 9 (ref 5–15)
BUN: 47 mg/dL — ABNORMAL HIGH (ref 6–20)
CO2: 27 mmol/L (ref 22–32)
Calcium: 8.7 mg/dL — ABNORMAL LOW (ref 8.9–10.3)
Chloride: 108 mmol/L (ref 101–111)
Creatinine, Ser: 1.78 mg/dL — ABNORMAL HIGH (ref 0.44–1.00)
GFR calc non Af Amer: 24 mL/min — ABNORMAL LOW (ref >60)
GFR, EST AFRICAN AMERICAN: 28 mL/min — AB (ref >60)
GLUCOSE: 83 mg/dL (ref 65–99)
POTASSIUM: 3.4 mmol/L — AB (ref 3.5–5.1)
Sodium: 144 mmol/L (ref 135–145)

## 2017-12-07 LAB — TSH: TSH: 1.297 u[IU]/mL (ref 0.350–4.500)

## 2017-12-07 LAB — MAGNESIUM: Magnesium: 2.1 mg/dL (ref 1.7–2.4)

## 2017-12-07 MED ORDER — AMLODIPINE BESYLATE 5 MG PO TABS
5.0000 mg | ORAL_TABLET | Freq: Every day | ORAL | Status: DC
  Administered 2017-12-07 – 2017-12-10 (×4): 5 mg via ORAL
  Filled 2017-12-07 (×4): qty 1

## 2017-12-07 MED ORDER — ACETAMINOPHEN 325 MG PO TABS
650.0000 mg | ORAL_TABLET | Freq: Four times a day (QID) | ORAL | Status: DC | PRN
  Administered 2017-12-07: 650 mg via ORAL
  Filled 2017-12-07: qty 2

## 2017-12-07 MED ORDER — MAGNESIUM OXIDE 400 (241.3 MG) MG PO TABS
400.0000 mg | ORAL_TABLET | Freq: Two times a day (BID) | ORAL | Status: AC
Start: 2017-12-07 — End: 2017-12-07
  Administered 2017-12-07 (×2): 400 mg via ORAL
  Filled 2017-12-07 (×2): qty 1

## 2017-12-07 MED ORDER — SODIUM CHLORIDE 0.9 % IV BOLUS (SEPSIS)
1000.0000 mL | Freq: Once | INTRAVENOUS | Status: AC
  Administered 2017-12-08: 1000 mL via INTRAVENOUS

## 2017-12-07 MED ORDER — HYDROCODONE-ACETAMINOPHEN 5-325 MG PO TABS
1.0000 | ORAL_TABLET | Freq: Four times a day (QID) | ORAL | Status: DC | PRN
  Administered 2017-12-07 – 2017-12-09 (×2): 1 via ORAL
  Filled 2017-12-07 (×2): qty 1

## 2017-12-07 MED ORDER — ACETAMINOPHEN 650 MG RE SUPP: 650.0000 mg | Freq: Four times a day (QID) | RECTAL | Status: DC | PRN

## 2017-12-07 MED ORDER — ASPIRIN EC 81 MG PO TBEC
81.0000 mg | DELAYED_RELEASE_TABLET | Freq: Every day | ORAL | Status: DC
  Administered 2017-12-07 – 2017-12-10 (×4): 81 mg via ORAL
  Filled 2017-12-07 (×4): qty 1

## 2017-12-07 MED ORDER — ZOLPIDEM TARTRATE 5 MG PO TABS
5.0000 mg | ORAL_TABLET | Freq: Every evening | ORAL | Status: DC | PRN
  Administered 2017-12-07 – 2017-12-09 (×3): 5 mg via ORAL
  Filled 2017-12-07 (×3): qty 1

## 2017-12-07 MED ORDER — POTASSIUM CHLORIDE CRYS ER 20 MEQ PO TBCR
40.0000 meq | EXTENDED_RELEASE_TABLET | Freq: Two times a day (BID) | ORAL | Status: AC
  Administered 2017-12-07 (×2): 40 meq via ORAL
  Filled 2017-12-07 (×2): qty 2

## 2017-12-07 MED ORDER — METOPROLOL TARTRATE 25 MG PO TABS: 12.5000 mg | ORAL_TABLET | Freq: Every day | ORAL | Status: DC | PRN

## 2017-12-07 MED ORDER — SODIUM CHLORIDE 0.9 % IV SOLN
INTRAVENOUS | Status: AC
  Administered 2017-12-07: 15:00:00 via INTRAVENOUS

## 2017-12-07 MED ORDER — LEVOTHYROXINE SODIUM 100 MCG PO TABS
100.0000 ug | ORAL_TABLET | Freq: Every day | ORAL | Status: DC
  Administered 2017-12-07 – 2017-12-10 (×4): 100 ug via ORAL
  Filled 2017-12-07 (×4): qty 1

## 2017-12-07 MED ORDER — SODIUM CHLORIDE 0.9 % IV SOLN
INTRAVENOUS | Status: DC
  Administered 2017-12-07: 04:00:00 via INTRAVENOUS

## 2017-12-07 MED ORDER — PNEUMOCOCCAL VAC POLYVALENT 25 MCG/0.5ML IJ INJ
0.5000 mL | INJECTION | INTRAMUSCULAR | Status: AC
  Administered 2017-12-08: 0.5 mL via INTRAMUSCULAR
  Filled 2017-12-07: qty 0.5

## 2017-12-07 MED ORDER — ALBUTEROL SULFATE (2.5 MG/3ML) 0.083% IN NEBU: 2.5000 mg | INHALATION_SOLUTION | Freq: Four times a day (QID) | RESPIRATORY_TRACT | Status: DC | PRN

## 2017-12-07 MED ORDER — POLYVINYL ALCOHOL 1.4 % OP SOLN
2.0000 [drp] | Freq: Every day | OPHTHALMIC | Status: DC
  Administered 2017-12-07 – 2017-12-09 (×3): 2 [drp] via OPHTHALMIC
  Filled 2017-12-07: qty 15

## 2017-12-07 MED ORDER — HEPARIN SODIUM (PORCINE) 5000 UNIT/ML IJ SOLN
5000.0000 [IU] | Freq: Three times a day (TID) | INTRAMUSCULAR | Status: DC
  Administered 2017-12-07 – 2017-12-10 (×11): 5000 [IU] via SUBCUTANEOUS
  Filled 2017-12-07 (×8): qty 1

## 2017-12-07 MED ORDER — AMIODARONE HCL 200 MG PO TABS
200.0000 mg | ORAL_TABLET | Freq: Every day | ORAL | Status: DC
  Administered 2017-12-07 – 2017-12-10 (×4): 200 mg via ORAL
  Filled 2017-12-07 (×4): qty 1

## 2017-12-07 MED ORDER — ATENOLOL 25 MG PO TABS
25.0000 mg | ORAL_TABLET | Freq: Every day | ORAL | Status: DC
  Administered 2017-12-07 – 2017-12-10 (×4): 25 mg via ORAL
  Filled 2017-12-07 (×4): qty 1

## 2017-12-07 MED ORDER — ISOSORBIDE MONONITRATE ER 30 MG PO TB24
30.0000 mg | ORAL_TABLET | Freq: Every day | ORAL | Status: DC
  Administered 2017-12-07 – 2017-12-10 (×4): 30 mg via ORAL
  Filled 2017-12-07 (×4): qty 1

## 2017-12-07 NOTE — Progress Notes (Signed)
OT Cancellation Note  Patient Details Name: Jeanne Hart MRN: [id number] DOB: Oct 31, 1926   Cancelled Treatment:    Reason Eval/Treat Not Completed: Other (comment)  Noted pt declined PT within the 1/2 hour. Will check back later today if schedule permits; if not, will return ASAP  [facility] 12/07/2017, 9:51 AM  Lesle Chris, OTR/L [phone] 12/07/2017

## 2017-12-07 NOTE — Progress Notes (Signed)
OT Cancellation Note  Patient Details Name: Jeanne Hart MRN: [id number] DOB: [birth year]   Cancelled Treatment:    Reason Eval/Treat Not Completed: Medical issues which prohibited therapy.  Pt got very dizzy when sitting up with PT.  We will check on her over the weekend, if schedule permits. If not, we will return Monday  Samuel Rittenhouse 12/07/2017, 2:14 PM  Lesle Chris, OTR/L [phone] 12/07/2017

## 2017-12-07 NOTE — Progress Notes (Signed)
PT Cancellation Note  Patient Details Name: Jeanne Hart MRN: [id number] DOB: 06-08-1927   Cancelled Treatment:    Reason Eval/Treat Not Completed: Other (comment) Pt reports "IBS" issues currently and pain in R hip, LE after fall prior to arrival.  RN in room and aware.  Pt states she is fearful of mobility due to multiple falls and does not feel she is able to mobilize.  Pt encouraged to attempt possibly later today which she is agreeable.  Will check back as schedule permits.   Kirill Chatterjee,KATHrine E 12/07/2017, 9:40 AM Carmelia Bake, PT, DPT 12/07/2017 Pager: [phone]

## 2017-12-07 NOTE — H&P (Signed)
History and Physical    MARCENA DIAS HCW:[id number] DOB: 06/06/1927 DOA: 12/06/2017  Referring MD/NP/PA: Providence Lanius, PA-C PCP: [facility] [place]  Patient coming from: Independent living section of assisted living facility via EMS  Chief Complaint: Falls  I have personally briefly reviewed patient's old medical records in [place]   HPI: Jeanne Hart is a 84 y.o. female with medical history significant of HTN, HLD, CAD s/p PCI, PAF not on anticoagulation, and  IBS; who presents after having a fall at home.  Patient reports that she calls walking over to the window and getting tripped up in the curtains and falling to the floor.  She is not sure if she hit her head this time or if there is any loss of consciousness, but notes that she has had multiple falls here recently and sustained a bruise to her left forehead with a previous fall.  Associated symptoms include intermittent lightheadedness, palpitations, weight loss of approximately 30 pounds in the last 6 months, and recent episode of diarrhea which she states that she was on the toilet for half the day yesterday.  She attributes part of the issue to developing hammertoe.  Normally she ambulates with the assistance of a walker.  Denies any focal weakness, chest pain, change in vision, shortness of breath,.  ED Course: In the ED the patient was found to have blood pressures elevated up to 182/78, and all other vital signs relatively within normal limits.  Labs were remarkable for BUN of 55 and creatinine of 2.07 patient's baseline previously had been around 1.2-1.4.  Urinalysis was negative for any signs of infection.  Received 1 L of normal saline IV fluids and 50 mcg of fentanyl.  TRH called to admit for weakness and acute kidney injury.   Review of Systems  Constitutional: Positive for malaise/fatigue.  HENT: Negative for congestion and nosebleeds.   Eyes: Negative for pain and discharge.  Respiratory: Negative for  cough and shortness of breath.   Cardiovascular: Positive for palpitations. Negative for chest pain and leg swelling.  Gastrointestinal: Positive for diarrhea. Negative for nausea and vomiting.  Genitourinary: Negative for dysuria and frequency.  Musculoskeletal: Positive for back pain (Chronic), falls and myalgias.  Neurological: Positive for dizziness, loss of consciousness and weakness. Negative for focal weakness.  Psychiatric/Behavioral: Negative for substance abuse. The patient has insomnia.     Past Medical History:  Diagnosis Date  . CAD S/P percutaneous coronary angioplasty    PCI x 2; once at [facility] and once in [place];; Myoview April 2017: LOW RISK. EF 63%. No ischemia or infarction.  . Diverticulitis   . Dyslipidemia   . Dyspnea on exertion   . Family history of cardiovascular disease   . GERD (gastroesophageal reflux disease)   . Hiatal hernia   . HTN (hypertension)   . Hypothyroidism   . IBS (irritable bowel syndrome)   . Insomnia    chronic  . PAF (paroxysmal atrial fibrillation) (HCC)    CHA2DS2VASC = 4.  coumadin was stopped secondary to GI Bleed, on amiodarone; monitor march 2014-[facility]    Past Surgical History:  Procedure Laterality Date  . ABDOMINAL HYSTERECTOMY    . APPENDECTOMY    . CHOLECYSTECTOMY    . CORONARY ANGIOPLASTY WITH STENT PLACEMENT  01/05/2011   2.5x27mm Promus DES stent to RCA post dilated to 2.75x22mm   . CORONARY ANGIOPLASTY WITH STENT PLACEMENT  ?   3 stents per patient  . NM MYOVIEW LTD  April  2017   LOW RISK. EF 60%. No ischemia or infarction.     reports that  has never smoked. she has never used smokeless tobacco. She reports that she does not drink alcohol or use drugs.  Allergies  Allergen Reactions  . Nexium [Esomeprazole] Other (See Comments)    Aggravates IBS.   . Statins Other (See Comments)    Had jaundice as child.  Does tolerate current statin therapy.    Abel Presto [Phenazopyridine] Other (See Comments)    On MAR  .  Codeine Other (See Comments)    Post-surgical reaction of unknown type.  . Morphine And Related Other (See Comments)    Unknown.    Marland Kitchen Penicillins Other (See Comments)    Unknown. Has patient had a PCN reaction causing immediate rash, facial/tongue/throat swelling, SOB or lightheadedness with hypotension: YES Has patient had a PCN reaction causing severe rash involving mucus membranes or skin necrosis:NO Has patient had a PCN reaction that required hospitalization NO Has patient had a PCN reaction occurring within the last 10 years: NO If all of the above answers are "NO", then may proceed with Cephalosporin use.     . Sulfa Antibiotics Other (See Comments)    Unknown childhood reaction.      Family History  Problem Relation Age of Onset  . Stroke Mother   . Diabetes Maternal Aunt   . Diabetes Son   . Heart disease Unknown        Both sides of family  . Colon cancer Neg Hx     Prior to Admission medications   Medication Sig Start Date End Date Taking? Authorizing Provider  amiodarone (PACERONE) 200 MG tablet TAKE 1 TABLET BY MOUTH ONCE DAILY AND 1 TABLET AS NEEDED FOR FAST HEART RATE 06/18/17  Yes Leonie Man, MD  amLODipine (NORVASC) 5 MG tablet Take 1 tablet by mouth daily. take 1 tab dialy 05/07/15  Yes [provider]  Ascorbic Acid (VITAMIN C PO) Take 1 tablet by mouth daily.    Yes [provider]  aspirin EC 81 MG tablet Take 1 tablet (81 mg total) by mouth daily. 02/15/15  Yes Leonie Man, MD  atenolol (TENORMIN) 25 MG tablet Take 25 mg by mouth daily.   Yes [provider]  Cholecalciferol (VITAMIN D PO) Take 1 tablet by mouth daily.    Yes [provider]  furosemide (LASIX) 20 MG tablet Take 2 tablets of 20 mg or 3 days then take 20 mg one tablet daily ,may take an extra 20 mg if needed for swelling 08/23/17  Yes Leonie Man, MD  isosorbide mononitrate (IMDUR) 30 MG 24 hr tablet Take 1 tablet (30 mg total) by mouth daily.  09/09/14  Yes Harris, Abigail, PA-C  levothyroxine (SYNTHROID, LEVOTHROID) 100 MCG tablet Take 100 mcg by mouth daily before breakfast.   Yes [provider]  meloxicam (MOBIC) 7.5 MG tablet Take 7.5 mg by mouth 2 (two) times daily. 11/29/17  Yes [provider]  metoprolol tartrate (LOPRESSOR) 25 MG tablet Take 1 tablet (25 mg total) daily as needed by mouth. For heart rhythm Patient taking differently: Take 12.5-25 mg by mouth daily as needed (heart rhythm). For heart rhythm 08/24/17  Yes Leonie Man, MD  naproxen sodium (ALEVE) 220 MG tablet Take 220 mg by mouth daily as needed (pain).   Yes [provider]  nitroGLYCERIN (NITROSTAT) 0.4 MG SL tablet Place 1 tablet (0.4 mg total) under the tongue every  5 (five) minutes as needed for chest pain. MAX 3 doses 02/05/17  Yes Leonie Man, MD  pantoprazole (PROTONIX) 40 MG tablet Take 40 mg by mouth daily.    Yes [provider]  Polyethyl Glycol-Propyl Glycol (SYSTANE) 0.4-0.3 % SOLN Apply 2 drops to eye at bedtime.   Yes [provider]  potassium chloride (K-DUR) 10 MEQ tablet Take 1 tablet (10 mEq total) by mouth daily. 03/05/16  Yes Maryan Puls, MD  Solifenacin Succinate (VESICARE PO) Take 1 tablet by mouth daily.   Yes [provider]  zolpidem (AMBIEN CR) 12.5 MG CR tablet Take 12.5 mg by mouth every other day.   Yes [provider]  ferrous sulfate 325 (65 FE) MG tablet Take 1 tablet (325 mg total) by mouth 2 (two) times daily with a meal. Patient not taking: Reported on 08/23/2017 10/20/16   Rama, Venetia Maxon, MD  UNABLE TO FIND Inject 1 each into the muscle once. Cortisone Injection and Prednisone Injection on Monday 11/26/17. Both injections were in the hip (1 in each hip)    [provider]    Physical Exam:  Constitutional: Elderly female in no acute distress at this time Vitals:   12/06/17 1859 12/06/17 1904 12/06/17 2121  BP:  (!) 159/67 (!) 182/78    Pulse:  61 66  Resp:  17 18  SpO2: 98% 99% 99%   Eyes: PERRL, lids and conjunctivae normal ENMT: Mucous membranes are dry posterior pharynx clear of any exudate or lesions  Neck: normal, supple, no masses, no thyromegaly Respiratory: clear to auscultation bilaterally, no wheezing, no crackles. Normal respiratory effort. No accessory muscle use.  Cardiovascular: Regular rate and rhythm, no murmurs / rubs / gallops. No extremity edema. 2+ pedal pulses. No carotid bruits.  Abdomen: no tenderness, no masses palpated. No hepatosplenomegaly. Bowel sounds positive.  Musculoskeletal: Hammertoe present of the left lower extremity and developing on the right. Skin: Bruise to the left forehead and multiple bruises of the upper extremity. Neurologic: CN 2-12 grossly intact. Sensation intact, DTR normal. Strength 5/5 in all 4.  Psychiatric: Normal judgment and insight. Alert and oriented x 3. Normal mood.     Labs on Admission: I have personally reviewed following labs and imaging studies  CBC: Recent Labs  Lab 12/06/17 2211  WBC 9.2  NEUTROABS 7.8*  HGB 14.2  HCT 41.0  MCV 88.0  PLT 397   Basic Metabolic Panel: Recent Labs  Lab 12/06/17 2211  NA 140  K 4.1  CL 102  CO2 27  GLUCOSE 109*  BUN 55*  CREATININE 2.07*  CALCIUM 9.5   GFR: CrCl cannot be calculated (Unknown ideal weight.). Liver Function Tests: No results for input(s): AST, ALT, ALKPHOS, BILITOT, PROT, ALBUMIN in the last 168 hours. No results for input(s): LIPASE, AMYLASE in the last 168 hours. No results for input(s): AMMONIA in the last 168 hours. Coagulation Profile: No results for input(s): INR, PROTIME in the last 168 hours. Cardiac Enzymes: No results for input(s): CKTOTAL, CKMB, CKMBINDEX, TROPONINI in the last 168 hours. BNP (last 3 results) No results for input(s): PROBNP in the last 8760 hours. HbA1C: No results for input(s): HGBA1C in the last 72 hours. CBG: No results for input(s): GLUCAP in the  last 168 hours. Lipid Profile: No results for input(s): CHOL, HDL, LDLCALC, TRIG, CHOLHDL, LDLDIRECT in the last 72 hours. Thyroid Function Tests: No results for input(s): TSH, T4TOTAL, FREET4, T3FREE, THYROIDAB in the last 72 hours. Anemia Panel: No results  for input(s): VITAMINB12, FOLATE, FERRITIN, TIBC, IRON, RETICCTPCT in the last 72 hours. Urine analysis:    Component Value Date/Time   COLORURINE YELLOW 12/06/2017 2211   APPEARANCEUR CLEAR 12/06/2017 2211   LABSPEC 1.005 12/06/2017 2211   PHURINE 6.0 12/06/2017 2211   GLUCOSEU NEGATIVE 12/06/2017 2211   HGBUR SMALL (A) 12/06/2017 2211   BILIRUBINUR NEGATIVE 12/06/2017 2211   KETONESUR NEGATIVE 12/06/2017 2211   PROTEINUR NEGATIVE 12/06/2017 2211   UROBILINOGEN 0.2 05/10/2015 1741   NITRITE NEGATIVE 12/06/2017 2211   LEUKOCYTESUR SMALL (A) 12/06/2017 2211   Sepsis Labs: No results found for this or any previous visit (from the past 240 hour(s)).   Radiological Exams on Admission: Dg Lumbar Spine Complete  Result Date: 12/06/2017 CLINICAL DATA:  Fall earlier tonight with low back pain radiating to right hip. EXAM: LUMBAR SPINE - COMPLETE 4+ VIEW COMPARISON:  11/26/2017 FINDINGS: Mild diffuse osteopenia. Subtle curvature of the lumbar spine convex left unchanged. There is mild spondylosis of the lumbar spine. Stable mild L3 compression fracture and stable moderate T12 compression fracture. No new compression fracture or spondylolisthesis. There is facet arthropathy over the lower lumbar spine. Calcified plaque over the thoracoabdominal aorta. Mild degenerative change of the hips. IMPRESSION: No acute findings. Mild spondylosis of the lumbar spine. Stable moderate T12 compression fracture and stable mild L3 compression fracture. Electronically Signed   By: Marin Olp M.D.   On: 12/06/2017 20:23   Ct Head Wo Contrast  Result Date: 12/06/2017 CLINICAL DATA:  Fall EXAM: CT HEAD WITHOUT CONTRAST CT CERVICAL SPINE WITHOUT CONTRAST  TECHNIQUE: Multidetector CT imaging of the head and cervical spine was performed following the standard protocol without intravenous contrast. Multiplanar CT image reconstructions of the cervical spine were also generated. COMPARISON:  CT brain 12/01/2017 FINDINGS: CT HEAD FINDINGS Brain: No acute territorial infarction, hemorrhage or intracranial mass. Probable old lacunar infarct left basal ganglia. Moderate small vessel ischemic changes of the white matter. Moderate atrophy. Stable ventricle size Vascular: No hyperdense vessels.  Carotid vascular calcification Skull: No fracture Sinuses/Orbits: No acute finding. Other: None CT CERVICAL SPINE FINDINGS Alignment: No subluxation.  Facet alignment within normal limits Skull base and vertebrae: No acute fracture. No primary bone lesion or focal pathologic process. Soft tissues and spinal canal: No prevertebral fluid or swelling. No visible canal hematoma. Disc levels: Moderate degenerative changes at C3-C4, C4-C5, C5-C6 and C6-C7. Multiple level bilateral foraminal stenosis, most marked on the right side at C4-C5 with moderate narrowing bilaterally at C3 C4. Upper chest: Enlarged nodular thyroid. Other: None IMPRESSION: 1. No CT evidence for acute intracranial abnormality. Atrophy and small vessel ischemic changes of the white matter 2. Moderate degenerative changes of the cervical spine without acute osseous abnormality 3. Enlarged multinodular thyroid. Electronically Signed   By: Donavan Foil M.D.   On: 12/06/2017 20:38   Ct Cervical Spine Wo Contrast  Result Date: 12/06/2017 CLINICAL DATA:  Fall EXAM: CT HEAD WITHOUT CONTRAST CT CERVICAL SPINE WITHOUT CONTRAST TECHNIQUE: Multidetector CT imaging of the head and cervical spine was performed following the standard protocol without intravenous contrast. Multiplanar CT image reconstructions of the cervical spine were also generated. COMPARISON:  CT brain 12/01/2017 FINDINGS: CT HEAD FINDINGS Brain: No acute  territorial infarction, hemorrhage or intracranial mass. Probable old lacunar infarct left basal ganglia. Moderate small vessel ischemic changes of the white matter. Moderate atrophy. Stable ventricle size Vascular: No hyperdense vessels.  Carotid vascular calcification Skull: No fracture Sinuses/Orbits: No acute finding. Other: None CT CERVICAL SPINE  FINDINGS Alignment: No subluxation.  Facet alignment within normal limits Skull base and vertebrae: No acute fracture. No primary bone lesion or focal pathologic process. Soft tissues and spinal canal: No prevertebral fluid or swelling. No visible canal hematoma. Disc levels: Moderate degenerative changes at C3-C4, C4-C5, C5-C6 and C6-C7. Multiple level bilateral foraminal stenosis, most marked on the right side at C4-C5 with moderate narrowing bilaterally at C3 C4. Upper chest: Enlarged nodular thyroid. Other: None IMPRESSION: 1. No CT evidence for acute intracranial abnormality. Atrophy and small vessel ischemic changes of the white matter 2. Moderate degenerative changes of the cervical spine without acute osseous abnormality 3. Enlarged multinodular thyroid. Electronically Signed   By: Donavan Foil M.D.   On: 12/06/2017 20:38   Dg Hip Unilat W Or Wo Pelvis 2-3 Views Right  Result Date: 12/06/2017 CLINICAL DATA:  Fall earlier tonight with low back and right hip pain. EXAM: DG HIP (WITH OR WITHOUT PELVIS) 2-3V RIGHT COMPARISON:  03/05/2016 FINDINGS: There is mild diffuse decreased bone mineralization. Minimal symmetric degenerative change of the hips. No evidence of acute fracture or dislocation. Pelvic phleboliths. Degenerate change of the spine. IMPRESSION: No acute findings. Electronically Signed   By: Marin Olp M.D.   On: 12/06/2017 20:24    EKG: Independently reviewed.  Sinus rhythm with prolonged QTC of 508  Assessment/Plan Acute kidney injury 2/2 dehydration: Patient's baseline creatinine appears to be around 1.2-1.4 but presents with a  creatinine of 2.07 and BUN 55.  Suspect prerenal cause of symptoms. - Admit to a telemetry bed - Bolus 1 L normal saline IV fluids, then placed on a rate of 100 mL/h - Recheck BMP in a.m. - Hold nephrotoxic agents  Falls and generalized weakness - PT/OT to eval and treat -Check orthostatic vital signs  Prolonged QTC - Recheck EKG in a.m.  H/O SWH:QPRFFMB reports having intermittent episodes of diarrhea. - Monitor intake and output  Essential hypertension - Continue amlodipine, atenolol, and metoprolol prn - Hold Lasix due to acute kidney injury - Question need of adjustment of blood pressure medication  WGY:[id number] score 4, but not on anticoagulation due to history of GI bleed and high fall risk. - Continue metoprolol as needed for elevated heart rate - Restart amiodarone in a.m. QT stable or improved - Follow-up telemetry overnight  History of CAD s/p PCI - Continue aspirin  Hypothyroidism, goiter - Follow-up TSH  - Continue levothyroxine  Weight loss: Unclear cause of weight loss of 30 pounds over the last 6 months. - May warrant further investigation  GERD  - restart Protonix when medically appropriate  Insomnia: Patient reports taking 12.5 mg of Ambien per night and would like this restarted.  DVT prophylaxis: heparin   Code Status: full  Family Communication: No family present at bedside Disposition Plan: To be determined Consults called: None Admission status: inpaitent   Norval Morton MD [facility] Pager [phone]   If 7PM-7AM, please contact night-coverage [email or web address] Password [email or web address]  12/07/2017, 1:34 AM

## 2017-12-07 NOTE — Progress Notes (Signed)
CSW acknowledged consult for SNF placement. Patient is a resident at [facility]. PT consulted, evaluation pending. CSW will follow up after PT makes recommendation, if appropriate.   Jeanne Hart, [place] Social Worker [facility] Cell#: [phone]

## 2017-12-07 NOTE — Evaluation (Signed)
Physical Therapy Evaluation Patient Details Name: Jeanne Hart MRN: [id number] DOB: Oct 10, 1926 Today's Date: 12/07/2017   History of Present Illness  84 y.o. female with medical history significant of HTN, HLD, CAD s/p PCI, PAF not on anticoagulation, and IBS; who presents after having a fall at home; diagnosed with acute kidney injury due to dehydration  Clinical Impression  Pt admitted with above diagnosis. Pt currently with functional limitations due to the deficits listed below (see PT Problem List).  Pt will benefit from skilled PT to increase their independence and safety with mobility to allow discharge to the venue listed below.   Pt attempted to mobilize however reports increased dizziness with sitting and then standing, also reports posterior R thigh pain with mobility (had prior to admission however worse at this time).  Pt agrees that she cannot function alone upon d/c at this time.  Recommending SNF upon d/c.  Pt states she is willing to go with plan of her family.  Orthostatic BPs  Supine 143/70 mmHg, 55 bpm  Sitting 162/74 mmHg, 56 bpm     Standing after 30 sec 176/65 mmHg, 54 bpm      Pt unable to tolerate standing and needed to return to sitting for last reading.  Pt immediately requested to return to supine after last BP due to continued dizziness.      Follow Up Recommendations SNF    Equipment Recommendations  None recommended by PT    Recommendations for Other Services       Precautions / Restrictions Precautions Precautions: Fall      Mobility  Bed Mobility Overal bed mobility: Needs Assistance Bed Mobility: Supine to Sit;Sit to Supine     Supine to sit: Min guard;HOB elevated Sit to supine: Min guard;HOB elevated   General bed mobility comments: increased time and effort, dizziness upon sitting  Transfers Overall transfer level: Needs assistance Equipment used: Rolling walker (2 wheeled) Transfers: Sit to/from Stand Sit to Stand: Mod assist          General transfer comment: assist to rise and steady, pt attempted to remain standing however reported being too dizzy and increased discomfort of R LE posteriorly  Ambulation/Gait                Stairs            Wheelchair Mobility    Modified Rankin (Stroke Patients Only)       Balance Overall balance assessment: History of Falls(2 falls in the past 2 weeks per daughter)                                           Pertinent Vitals/Pain Pain Assessment: 0-10 Pain Score: 8  Pain Location: R posterior thigh Pain Descriptors / Indicators: Sore Pain Intervention(s): Limited activity within patient's tolerance;Repositioned;Monitored during session    Home Living   Living Arrangements: Alone   Type of Home: Independent living facility         Home Equipment: Walker - 4 wheels      Prior Function Level of Independence: Independent with assistive device(s)               Hand Dominance        Extremity/Trunk Assessment        Lower Extremity Assessment Lower Extremity Assessment: Generalized weakness;RLE deficits/detail RLE Deficits / Details: reports pain in R leg prior  to fall however now pain is worse, grossly 2+/5 throughout per observation       Communication   Communication: No difficulties  Cognition Arousal/Alertness: Awake/alert Behavior During Therapy: WFL for tasks assessed/performed Overall Cognitive Status: Within Functional Limits for tasks assessed                                        General Comments      Exercises     Assessment/Plan    PT Assessment Patient needs continued PT services  PT Problem List Decreased strength;Decreased mobility;Decreased activity tolerance;Decreased balance;Decreased knowledge of use of DME       PT Treatment Interventions Gait training;DME instruction;Therapeutic activities;Therapeutic exercise;Patient/family education;Functional mobility  training;Balance training    PT Goals (Current goals can be found in the Care Plan section)  Acute Rehab PT Goals PT Goal Formulation: With patient Time For Goal Achievement: 12/21/17 Potential to Achieve Goals: Fair    Frequency Min 3X/week   Barriers to discharge        Co-evaluation               AM-PAC PT "6 Clicks" Daily Activity  Outcome Measure Difficulty turning over in bed (including adjusting bedclothes, sheets and blankets)?: None Difficulty moving from lying on back to sitting on the side of the bed? : A Little Difficulty sitting down on and standing up from a chair with arms (e.g., wheelchair, bedside commode, etc,.)?: Unable Help needed moving to and from a bed to chair (including a wheelchair)?: Total Help needed walking in hospital room?: Total Help needed climbing 3-5 steps with a railing? : Total 6 Click Score: 11    End of Session Equipment Utilized During Treatment: Gait belt Activity Tolerance: Patient limited by pain(limited by dizziness) Patient left: in bed;with call bell/phone within reach;with bed alarm set Nurse Communication: Mobility status PT Visit Diagnosis: Other abnormalities of gait and mobility (R26.89);Repeated falls (R29.6)    Time: 1335-1350 PT Time Calculation (min) (ACUTE ONLY): 15 min   Charges:   PT Evaluation $PT Eval Low Complexity: 1 Low     PT G Codes:        Carmelia Bake, PT, DPT 12/07/2017 Pager: [phone]  York Ram E 12/07/2017, 2:20 PM

## 2017-12-07 NOTE — Progress Notes (Signed)
[facility] PROGRESS NOTE    Progress Note  STOREY STANGELAND  LZJ:[id number] DOB: 1927/06/21 DOA: 12/06/2017 PCP: [facility]     Brief Narrative:   Jeanne Hart is an 84 y.o. female past medical history of essential hypertension paroxysmal atrial fibrillation not on anticoagulation who presents after having a fall at home, she relates she has been lightheaded with palpitation and lightheadedness and palpitations, she relates about a 30 pound weight loss in the last 6 months.  Assessment/Plan:   AKI (acute kidney injury) ([place]) With a baseline creatinine of 1.2, on admission 2.0 Suspect prerenal in etiology, in the setting of Lasix and NSAIDs use.  Hold Lasix and NSAIDs. Creatinine is slowly improving with IV fluid hydration.  Hypokalemia: Repleted orally. Replete magnesium orally, magnesium serum lab is pending.  Essential hypertension: Resume all antihypertensive medication except Lasix  Paroxysmal atrial fibrillation [facility]):  Not on Anticoagulation 2/2 GIB history.  CHA2DS2VASC =4 Restart amiodarone.  Hypothyroidism: Continue Synthroid.  Prolonged QT interval Now improving, check magnesium level replete magnesium orally. Resume amiodarone.  Frequent falls Awaiting physical therapy consult.   DVT prophylaxis: lovenox Family Communication:none Disposition Plan/Barrier to D/C: home in 1 day Code Status:     Code Status Orders  (From admission, onward)        Start     Ordered   12/07/17 0147  Full code  Continuous     12/07/17 0149    Code Status History    Date Active Date Inactive Code Status Order ID Comments User Context   10/19/2016 21:33 10/20/2016 16:17 Full Code [id number]  Karmen Bongo, MD Inpatient   05/11/2015 02:50 05/11/2015 16:09 Full Code [id number]  Lavina Hamman, MD Inpatient   03/23/2014 20:22 03/25/2014 14:16 Full Code [id number]  Louellen Molder, MD ED   03/23/2014 19:53 03/23/2014 20:22 Full Code [id number]  Louellen Molder, MD ED    Advance Directive Documentation     Most Recent Value  Type of Advance Directive  Healthcare Power of Attorney  Pre-existing out of facility DNR order (yellow form or pink MOST form)  No data  "MOST" Form in Place?  No data        IV Access:    Peripheral IV   Procedures and diagnostic studies:   Dg Lumbar Spine Complete  Result Date: 12/06/2017 CLINICAL DATA:  Fall earlier tonight with low back pain radiating to right hip. EXAM: LUMBAR SPINE - COMPLETE 4+ VIEW COMPARISON:  11/26/2017 FINDINGS: Mild diffuse osteopenia. Subtle curvature of the lumbar spine convex left unchanged. There is mild spondylosis of the lumbar spine. Stable mild L3 compression fracture and stable moderate T12 compression fracture. No new compression fracture or spondylolisthesis. There is facet arthropathy over the lower lumbar spine. Calcified plaque over the thoracoabdominal aorta. Mild degenerative change of the hips. IMPRESSION: No acute findings. Mild spondylosis of the lumbar spine. Stable moderate T12 compression fracture and stable mild L3 compression fracture. Electronically Signed   By: Marin Olp M.D.   On: 12/06/2017 20:23   Ct Head Wo Contrast  Result Date: 12/06/2017 CLINICAL DATA:  Fall EXAM: CT HEAD WITHOUT CONTRAST CT CERVICAL SPINE WITHOUT CONTRAST TECHNIQUE: Multidetector CT imaging of the head and cervical spine was performed following the standard protocol without intravenous contrast. Multiplanar CT image reconstructions of the cervical spine were also generated. COMPARISON:  CT brain 12/01/2017 FINDINGS: CT HEAD FINDINGS Brain: No acute territorial infarction, hemorrhage or intracranial mass. Probable old lacunar infarct left basal ganglia. Moderate small  vessel ischemic changes of the white matter. Moderate atrophy. Stable ventricle size Vascular: No hyperdense vessels.  Carotid vascular calcification Skull: No fracture Sinuses/Orbits: No acute finding. Other: None CT  CERVICAL SPINE FINDINGS Alignment: No subluxation.  Facet alignment within normal limits Skull base and vertebrae: No acute fracture. No primary bone lesion or focal pathologic process. Soft tissues and spinal canal: No prevertebral fluid or swelling. No visible canal hematoma. Disc levels: Moderate degenerative changes at C3-C4, C4-C5, C5-C6 and C6-C7. Multiple level bilateral foraminal stenosis, most marked on the right side at C4-C5 with moderate narrowing bilaterally at C3 C4. Upper chest: Enlarged nodular thyroid. Other: None IMPRESSION: 1. No CT evidence for acute intracranial abnormality. Atrophy and small vessel ischemic changes of the white matter 2. Moderate degenerative changes of the cervical spine without acute osseous abnormality 3. Enlarged multinodular thyroid. Electronically Signed   By: Donavan Foil M.D.   On: 12/06/2017 20:38   Ct Cervical Spine Wo Contrast  Result Date: 12/06/2017 CLINICAL DATA:  Fall EXAM: CT HEAD WITHOUT CONTRAST CT CERVICAL SPINE WITHOUT CONTRAST TECHNIQUE: Multidetector CT imaging of the head and cervical spine was performed following the standard protocol without intravenous contrast. Multiplanar CT image reconstructions of the cervical spine were also generated. COMPARISON:  CT brain 12/01/2017 FINDINGS: CT HEAD FINDINGS Brain: No acute territorial infarction, hemorrhage or intracranial mass. Probable old lacunar infarct left basal ganglia. Moderate small vessel ischemic changes of the white matter. Moderate atrophy. Stable ventricle size Vascular: No hyperdense vessels.  Carotid vascular calcification Skull: No fracture Sinuses/Orbits: No acute finding. Other: None CT CERVICAL SPINE FINDINGS Alignment: No subluxation.  Facet alignment within normal limits Skull base and vertebrae: No acute fracture. No primary bone lesion or focal pathologic process. Soft tissues and spinal canal: No prevertebral fluid or swelling. No visible canal hematoma. Disc levels: Moderate  degenerative changes at C3-C4, C4-C5, C5-C6 and C6-C7. Multiple level bilateral foraminal stenosis, most marked on the right side at C4-C5 with moderate narrowing bilaterally at C3 C4. Upper chest: Enlarged nodular thyroid. Other: None IMPRESSION: 1. No CT evidence for acute intracranial abnormality. Atrophy and small vessel ischemic changes of the white matter 2. Moderate degenerative changes of the cervical spine without acute osseous abnormality 3. Enlarged multinodular thyroid. Electronically Signed   By: Donavan Foil M.D.   On: 12/06/2017 20:38   Dg Hip Unilat W Or Wo Pelvis 2-3 Views Right  Result Date: 12/06/2017 CLINICAL DATA:  Fall earlier tonight with low back and right hip pain. EXAM: DG HIP (WITH OR WITHOUT PELVIS) 2-3V RIGHT COMPARISON:  03/05/2016 FINDINGS: There is mild diffuse decreased bone mineralization. Minimal symmetric degenerative change of the hips. No evidence of acute fracture or dislocation. Pelvic phleboliths. Degenerate change of the spine. IMPRESSION: No acute findings. Electronically Signed   By: Marin Olp M.D.   On: 12/06/2017 20:24     Medical Consultants:    None.  Anti-Infectives:   none  Subjective:    Lucresia C Occhipinti she relates she feels great, no compalins  Objective:    Vitals:   12/06/17 1904 12/06/17 2121 12/07/17 0315 12/07/17 0528  BP: (!) 159/67 (!) 182/78 (!) 177/93 (!) 155/66  Pulse: 61 66 65   Resp: 17 18 18    Temp:   (!) 97.5 F (36.4 C)   TempSrc:   Oral   SpO2: 99% 99% 98%   Weight:   59.2 kg (130 lb 8.2 oz)   Height:   5\' 4"  (1.626 m)  Intake/Output Summary (Last 24 hours) at 12/07/2017 0925 Last data filed at 12/07/2017 0315 Gross per 24 hour  Intake -  Output 475 ml  Net -475 ml   Filed Weights   12/07/17 0315  Weight: 59.2 kg (130 lb 8.2 oz)    Exam: General exam: In no acute distress. Respiratory system: Good air movement and clear to auscultation. Cardiovascular system: S1 & S2 heard, RRR.    Gastrointestinal system: Abdomen is nondistended, soft and nontender.  Central nervous system: Alert and oriented. No focal neurological deficits. Extremities: No pedal edema. Skin: No rashes, lesions or ulcers Psychiatry: Judgement and insight appear normal. Mood & affect appropriate.    Data Reviewed:    Labs: Basic Metabolic Panel: Recent Labs  Lab 12/06/17 2211 12/07/17 0419  NA 140 144  K 4.1 3.4*  CL 102 108  CO2 27 27  GLUCOSE 109* 83  BUN 55* 47*  CREATININE 2.07* 1.78*  CALCIUM 9.5 8.7*   GFR Estimated Creatinine Clearance: 18.9 mL/min (A) (by C-G formula based on SCr of 1.78 mg/dL (H)). Liver Function Tests: No results for input(s): AST, ALT, ALKPHOS, BILITOT, PROT, ALBUMIN in the last 168 hours. No results for input(s): LIPASE, AMYLASE in the last 168 hours. No results for input(s): AMMONIA in the last 168 hours. Coagulation profile No results for input(s): INR, PROTIME in the last 168 hours.  CBC: Recent Labs  Lab 12/06/17 2211 12/07/17 0419  WBC 9.2 7.5  NEUTROABS 7.8*  --   HGB 14.2 12.4  HCT 41.0 37.5  MCV 88.0 89.7  PLT 249 214   Cardiac Enzymes: No results for input(s): CKTOTAL, CKMB, CKMBINDEX, TROPONINI in the last 168 hours. BNP (last 3 results) No results for input(s): PROBNP in the last 8760 hours. CBG: No results for input(s): GLUCAP in the last 168 hours. D-Dimer: No results for input(s): DDIMER in the last 72 hours. Hgb A1c: No results for input(s): HGBA1C in the last 72 hours. Lipid Profile: No results for input(s): CHOL, HDL, LDLCALC, TRIG, CHOLHDL, LDLDIRECT in the last 72 hours. Thyroid function studies: Recent Labs    12/07/17 0419  TSH 1.297   Anemia work up: No results for input(s): VITAMINB12, FOLATE, FERRITIN, TIBC, IRON, RETICCTPCT in the last 72 hours. Sepsis Labs: Recent Labs  Lab 12/06/17 2211 12/07/17 0419  WBC 9.2 7.5   Microbiology No results found for this or any previous visit (from the past 240  hour(s)).   Medications:   . amLODipine  5 mg Oral Daily  . aspirin EC  81 mg Oral Daily  . atenolol  25 mg Oral Daily  . heparin  5,000 Units Subcutaneous Q8H  . isosorbide mononitrate  30 mg Oral Daily  . levothyroxine  100 mcg Oral QAC breakfast  . [START ON 12/08/2017] pneumococcal 23 valent vaccine  0.5 mL Intramuscular Tomorrow-1000  . polyvinyl alcohol  2 drop Both Eyes QHS  . potassium chloride  40 mEq Oral BID   Continuous Infusions: . sodium chloride 100 mL/hr at 12/07/17 0356  . sodium chloride       LOS: 0 days   Charlynne Cousins  [facility] Pager [phone]  *Please refer to [email or web address], password [email or web address] to get updated schedule on who will round on this patient, as hospitalists switch teams weekly. If 7PM-7AM, please contact night-coverage at [email or web address], password [email or web address] for any overnight needs.  12/07/2017, 9:25 AM

## 2017-12-08 ENCOUNTER — Other Ambulatory Visit: Payer: Self-pay

## 2017-12-08 ENCOUNTER — Inpatient Hospital Stay (HOSPITAL_COMMUNITY): Payer: Medicare Other

## 2017-12-08 DIAGNOSIS — E039 Hypothyroidism, unspecified: Secondary | ICD-10-CM

## 2017-12-08 DIAGNOSIS — R634 Abnormal weight loss: Secondary | ICD-10-CM

## 2017-12-08 DIAGNOSIS — N179 Acute kidney failure, unspecified: Principal | ICD-10-CM

## 2017-12-08 DIAGNOSIS — I10 Essential (primary) hypertension: Secondary | ICD-10-CM

## 2017-12-08 DIAGNOSIS — E860 Dehydration: Secondary | ICD-10-CM

## 2017-12-08 DIAGNOSIS — I480 Paroxysmal atrial fibrillation: Secondary | ICD-10-CM

## 2017-12-08 DIAGNOSIS — R9431 Abnormal electrocardiogram [ECG] [EKG]: Secondary | ICD-10-CM

## 2017-12-08 DIAGNOSIS — R296 Repeated falls: Secondary | ICD-10-CM

## 2017-12-08 DIAGNOSIS — Z9861 Coronary angioplasty status: Secondary | ICD-10-CM

## 2017-12-08 DIAGNOSIS — I2510 Atherosclerotic heart disease of native coronary artery without angina pectoris: Secondary | ICD-10-CM

## 2017-12-08 DIAGNOSIS — W19XXXA Unspecified fall, initial encounter: Secondary | ICD-10-CM

## 2017-12-08 LAB — BASIC METABOLIC PANEL
ANION GAP: 6 (ref 5–15)
BUN: 29 mg/dL — ABNORMAL HIGH (ref 6–20)
CALCIUM: 8.5 mg/dL — AB (ref 8.9–10.3)
CO2: 23 mmol/L (ref 22–32)
CREATININE: 1.14 mg/dL — AB (ref 0.44–1.00)
Chloride: 113 mmol/L — ABNORMAL HIGH (ref 101–111)
GFR, EST AFRICAN AMERICAN: 48 mL/min — AB (ref >60)
GFR, EST NON AFRICAN AMERICAN: 42 mL/min — AB (ref >60)
Glucose, Bld: 80 mg/dL (ref 65–99)
Potassium: 5 mmol/L (ref 3.5–5.1)
SODIUM: 142 mmol/L (ref 135–145)

## 2017-12-08 LAB — MAGNESIUM: MAGNESIUM: 2 mg/dL (ref 1.7–2.4)

## 2017-12-08 NOTE — Clinical Social Work Note (Signed)
Clinical Social Work Assessment  Patient Details  Name: Jeanne Hart MRN: [id number] Date of Birth: 07-15-1927  Date of referral:  12/08/17               Reason for consult:  Discharge Planning, Facility Placement                Permission sought to share information with:  Family Supports Permission granted to share information::  Yes, Verbal Permission Granted  Name::     Jeanne Hart  Agency::  snf  Relationship::  daughter [name])  Contact Information:  [phone]  Housing/Transportation Living arrangements for the past 2 months:  Apartment(senior citizen apartment) Source of Information:  Patient Patient Interpreter Needed:  None Criminal Activity/Legal Involvement Pertinent to Current Situation/Hospitalization:  No - Comment as needed Significant Relationships:  Adult Children, Community Support Lives with:  Self Do you feel safe going back to the place where you live?  No Need for family participation in patient care:  No (Coment)  Care giving concerns:  Patient daughter was bedside. Patient stated she lives in a retirement facility by herself. Patient stated she has support from children and other family members   Facilities manager / plan:  CSW met patient at bedside to discuss disposition plan. Patient and family agreeable to discharge patient to facility. Family stated they had wanted [facility] but unfortunately [facility]. CSW was abel to get patient into her second choice which will be [facility]. [facility] admission coordinator stated she will be able to take patient once medically cleared.    Employment status:  Retired Forensic scientist:  [employer] PT Recommendations:  [place] / Referral to community resources:  [place]  Patient/Family's Response to care: Family supportive of patient and have a cleared understanding of her medical needs  Patient/Family's Understanding of and Emotional Response to Diagnosis, Current  Treatment, and Prognosis:  Family agreeable to discharge to snf Emotional Assessment Appearance:  Appears stated age Attitude/Demeanor/Rapport:  Other(pleasant) Affect (typically observed):  Accepting, Pleasant Orientation:  Oriented to Self, Oriented to Place, Oriented to  Time, Oriented to Situation Alcohol / Substance use:  Not Applicable Psych involvement (Current and /or in the community):  No (Comment)  Discharge Needs  Concerns to be addressed:  No discharge needs identified Readmission within the last 30 days:  No Current discharge risk:  None Barriers to Discharge:  No Barriers Identified   Wende Neighbors, LCSW 12/08/2017, 3:18 PM

## 2017-12-08 NOTE — Progress Notes (Signed)
OT Cancellation Note  Patient Details Name: MARQUELLE BALOW MRN: [id number] DOB: 09/12/1927   Cancelled Treatment:    Reason Eval/Treat Not Completed: Other (comment)  Noted plan for SNF- will defer OT eval to SNF [facility] [place]  Payton Mccallum D 12/08/2017, 3:24 PM

## 2017-12-08 NOTE — Progress Notes (Signed)
PROGRESS NOTE    Jeanne Hart  OEU:[id number] DOB: 02-04-1927 DOA: 12/06/2017 PCP: [facility]   Brief Narrative:  Jeanne Hart is an 84 y.o. female past medical history of Essential Hypertension, paroxysmal atrial fibrillation not on anticoagulation, Hypothyroidism and other comorbids who presents after having a fall at home, she relates she has been lightheaded with palpitations and lightheadedness and palpitations, she relates about a 30 pound weight loss in the last 6 months.  Assessment & Plan:   Principal Problem:   AKI (acute kidney injury) ([place]) Active Problems:   Essential hypertension   Paroxysmal atrial fibrillation [facility]): Not on Anticoagulation 2/2 GIB history.  CHA2DS2VASC =4   Hypothyroidism   CAD S/P percutaneous coronary angioplasty   Prolonged QT interval   Frequent falls  AKI (acute kidney injury) on CKD Stage 3 -With a baseline creatinine of 1.2, on admission 2.0 -Suspect prerenal in etiology, in the setting of Lasix and NSAIDs use.  Hold Lasix and NSAIDs for now -Creatinine is improved with IVF Hydration -BUN/Cr is now 29/1.14 -Bolused 1 Liter and given IVF with NS at 125 mL/hr now D/C'd -Continue to Monitor and Repeat CMP in AM   Hypokalemia -K+ was 3.4 and improved to 5.0 -Repleted orally with Potassium Chloride 40 mEQ po BID. -Continue to Monitor and Replete as Necessary -Repeat CMP in AM   Essential Hypertension -C/w Amlodipine 5 mg po Daily and with Atenolol 25 mg po daily -C/w Isosorbide Mononitrate 30 mg po Daily -Continue to Hold Lasix   Paroxysmal Atrial Fibrillation [facility])  -Not on Anticoagulation 2/2 GIB history and Fall Risk.  CHA2DS2VASC =4 -Restarted Amiodarone at 200 mg po Daily. -Will Hold Metoprolol 12.5-25 mg po PRN -Check ECHOCardiogram -Place on Telemetry   Hypothyroidism -TSH was 1.297, Free T4 was 1.29 -C/w Levothyroxine 100 mcg po Daily   Prolonged QT interval -QTc is now 482 -Continue to Monitor  and Replete Electrolytes as Necessary -C/w Amiodarone 200 mg po Daily -Repeat EKG in AM   Generalized Weakness and Frequent Falls and associated Dizziness -PT/OT Evaluated and recommending SNF -Patient complained of Dizziness but was not Orthostatic -DG Lumbar Spine X-ray showed no acute findings; There was mild spondylosis of the Lumbar spine. Stable moderate T12 Compression Fx and Stable Mild L3 Compression Fx. -Urinalysis was Negative  -Try TED Hose  -Checking ECHOCardiogram -Check Head CT w/o Contrast for Dizziness  Hx of CAD s/p PCI -C/w ASA 81 mg po Daily, Atenolol 25 mg po Daily, and Isosorbide Mononitrate 30 mg po Daily -Stopped Metoprolol 12.5-25 mg po PRN  Weight Loss -Unclear Etiology of Weight Loss over the last 6 months -Nutritionist Consulted -Check CXR, FOBT, CRP, ESR, and LDH -Repeat CBC, CMP, Mag, Phos Level in AM  -Consider Abdominal Imaging and U/S -Appreciate Nutritionist Recc's   GERD -Holding Pantoprazole because of Prolong QTc  Chronic Insomnia -C/w Zolpidem 5 mg po qHSprn  Hx of IBS -C/w Loperamide PRN  DVT prophylaxis: Heparin 5,000 units sq q8h Code Status: FULL CODE Family Communication: Discussed with Family at bedside  Disposition Plan: SNF when medically stable to D/C   Consultants:   None  Procedures: None   Antimicrobials:  Anti-infectives (From admission, onward)   None     Subjective: Seen and examined at bedside and was feeling better. No CP or SOB. Was asking about Loperamide for Diarrhea. Still complaining of being somewhat dizzy.   Objective: Vitals:   12/07/17 1335 12/07/17 2106 12/08/17 0501 12/08/17 1414  BP:  Marland Kitchen)  152/62 (!) 156/69 (!) 113/56  Pulse:  60 (!) 51 (!) 46  Resp:  18 18 18   Temp: 97.7 F (36.5 C) 98 F (36.7 C) 98.1 F (36.7 C) 98 F (36.7 C)  TempSrc: Oral Oral Oral Oral  SpO2:  93% 90% 90%  Weight:      Height:        Intake/Output Summary (Last 24 hours) at 12/08/2017 1433 Last data filed  at 12/08/2017 1100 Gross per 24 hour  Intake 720 ml  Output 1600 ml  Net -880 ml   Filed Weights   12/07/17 0315  Weight: 59.2 kg (130 lb 8.2 oz)   Examination: Physical Exam:  Constitutional: Thin Elderly Caucasian femaile in NAD and appears calm and comfortable Eyes: Lids and conjunctivae normal, sclerae anicteric  HENMT: External Ears, Nose appear normal. Grossly normal hearing. Mucous membranes are moist. Has a bruise above Left eye  Neck: Appears normal, supple, no cervical masses, normal ROM, no appreciable thyromegaly, no JVD Respiratory: Diminished to auscultation bilaterally, no wheezing, rales, rhonchi or crackles. Normal respiratory effort and patient is not tachypenic. No accessory muscle use.  Cardiovascular: RRR, no murmurs / rubs / gallops. S1 and S2 auscultated. No extremity edema.  Abdomen: Soft, non-tender, non-distended. No masses palpated. No appreciable hepatosplenomegaly. Bowel sounds positive x4.  GU: Deferred. Musculoskeletal: No clubbing / cyanosis of digits/nails. No joint deformity upper and lower extremities. Has a Hammer Toe on Left foot Skin: Diffusely scattered ecchymosis on body. No induration; Warm and dry.  Neurologic: CN 2-12 grossly intact with no focal deficits. Romberg sign and cerebellar reflexes not assessed.  Psychiatric: Normal judgment and insight. Alert and oriented x 3. Normal mood and appropriate affect.   Data Reviewed: I have personally reviewed following labs and imaging studies  CBC: Recent Labs  Lab 12/06/17 2211 12/07/17 0419  WBC 9.2 7.5  NEUTROABS 7.8*  --   HGB 14.2 12.4  HCT 41.0 37.5  MCV 88.0 89.7  PLT 249 384   Basic Metabolic Panel: Recent Labs  Lab 12/06/17 2211 12/07/17 0419 12/07/17 1019 12/08/17 0416  NA 140 144  --  142  K 4.1 3.4*  --  5.0  CL 102 108  --  113*  CO2 27 27  --  23  GLUCOSE 109* 83  --  80  BUN 55* 47*  --  29*  CREATININE 2.07* 1.78*  --  1.14*  CALCIUM 9.5 8.7*  --  8.5*  MG  --    --  2.1 2.0   GFR: Estimated Creatinine Clearance: 29.5 mL/min (A) (by C-G formula based on SCr of 1.14 mg/dL (H)). Liver Function Tests: No results for input(s): AST, ALT, ALKPHOS, BILITOT, PROT, ALBUMIN in the last 168 hours. No results for input(s): LIPASE, AMYLASE in the last 168 hours. No results for input(s): AMMONIA in the last 168 hours. Coagulation Profile: No results for input(s): INR, PROTIME in the last 168 hours. Cardiac Enzymes: No results for input(s): CKTOTAL, CKMB, CKMBINDEX, TROPONINI in the last 168 hours. BNP (last 3 results) No results for input(s): PROBNP in the last 8760 hours. HbA1C: No results for input(s): HGBA1C in the last 72 hours. CBG: No results for input(s): GLUCAP in the last 168 hours. Lipid Profile: No results for input(s): CHOL, HDL, LDLCALC, TRIG, CHOLHDL, LDLDIRECT in the last 72 hours. Thyroid Function Tests: Recent Labs    12/07/17 0419  TSH 1.297  FREET4 1.29*   Anemia Panel: No results for input(s): VITAMINB12, FOLATE, FERRITIN,  TIBC, IRON, RETICCTPCT in the last 72 hours. Sepsis Labs: No results for input(s): PROCALCITON, LATICACIDVEN in the last 168 hours.  No results found for this or any previous visit (from the past 240 hour(s)).   Radiology Studies: Dg Lumbar Spine Complete  Result Date: 12/06/2017 CLINICAL DATA:  Fall earlier tonight with low back pain radiating to right hip. EXAM: LUMBAR SPINE - COMPLETE 4+ VIEW COMPARISON:  11/26/2017 FINDINGS: Mild diffuse osteopenia. Subtle curvature of the lumbar spine convex left unchanged. There is mild spondylosis of the lumbar spine. Stable mild L3 compression fracture and stable moderate T12 compression fracture. No new compression fracture or spondylolisthesis. There is facet arthropathy over the lower lumbar spine. Calcified plaque over the thoracoabdominal aorta. Mild degenerative change of the hips. IMPRESSION: No acute findings. Mild spondylosis of the lumbar spine. Stable moderate  T12 compression fracture and stable mild L3 compression fracture. Electronically Signed   By: Marin Olp M.D.   On: 12/06/2017 20:23   Ct Head Wo Contrast  Result Date: 12/06/2017 CLINICAL DATA:  Fall EXAM: CT HEAD WITHOUT CONTRAST CT CERVICAL SPINE WITHOUT CONTRAST TECHNIQUE: Multidetector CT imaging of the head and cervical spine was performed following the standard protocol without intravenous contrast. Multiplanar CT image reconstructions of the cervical spine were also generated. COMPARISON:  CT brain 12/01/2017 FINDINGS: CT HEAD FINDINGS Brain: No acute territorial infarction, hemorrhage or intracranial mass. Probable old lacunar infarct left basal ganglia. Moderate small vessel ischemic changes of the white matter. Moderate atrophy. Stable ventricle size Vascular: No hyperdense vessels.  Carotid vascular calcification Skull: No fracture Sinuses/Orbits: No acute finding. Other: None CT CERVICAL SPINE FINDINGS Alignment: No subluxation.  Facet alignment within normal limits Skull base and vertebrae: No acute fracture. No primary bone lesion or focal pathologic process. Soft tissues and spinal canal: No prevertebral fluid or swelling. No visible canal hematoma. Disc levels: Moderate degenerative changes at C3-C4, C4-C5, C5-C6 and C6-C7. Multiple level bilateral foraminal stenosis, most marked on the right side at C4-C5 with moderate narrowing bilaterally at C3 C4. Upper chest: Enlarged nodular thyroid. Other: None IMPRESSION: 1. No CT evidence for acute intracranial abnormality. Atrophy and small vessel ischemic changes of the white matter 2. Moderate degenerative changes of the cervical spine without acute osseous abnormality 3. Enlarged multinodular thyroid. Electronically Signed   By: Donavan Foil M.D.   On: 12/06/2017 20:38   Ct Cervical Spine Wo Contrast  Result Date: 12/06/2017 CLINICAL DATA:  Fall EXAM: CT HEAD WITHOUT CONTRAST CT CERVICAL SPINE WITHOUT CONTRAST TECHNIQUE: Multidetector CT  imaging of the head and cervical spine was performed following the standard protocol without intravenous contrast. Multiplanar CT image reconstructions of the cervical spine were also generated. COMPARISON:  CT brain 12/01/2017 FINDINGS: CT HEAD FINDINGS Brain: No acute territorial infarction, hemorrhage or intracranial mass. Probable old lacunar infarct left basal ganglia. Moderate small vessel ischemic changes of the white matter. Moderate atrophy. Stable ventricle size Vascular: No hyperdense vessels.  Carotid vascular calcification Skull: No fracture Sinuses/Orbits: No acute finding. Other: None CT CERVICAL SPINE FINDINGS Alignment: No subluxation.  Facet alignment within normal limits Skull base and vertebrae: No acute fracture. No primary bone lesion or focal pathologic process. Soft tissues and spinal canal: No prevertebral fluid or swelling. No visible canal hematoma. Disc levels: Moderate degenerative changes at C3-C4, C4-C5, C5-C6 and C6-C7. Multiple level bilateral foraminal stenosis, most marked on the right side at C4-C5 with moderate narrowing bilaterally at C3 C4. Upper chest: Enlarged nodular thyroid. Other: None IMPRESSION: 1.  No CT evidence for acute intracranial abnormality. Atrophy and small vessel ischemic changes of the white matter 2. Moderate degenerative changes of the cervical spine without acute osseous abnormality 3. Enlarged multinodular thyroid. Electronically Signed   By: Donavan Foil M.D.   On: 12/06/2017 20:38   Dg Hip Unilat W Or Wo Pelvis 2-3 Views Right  Result Date: 12/06/2017 CLINICAL DATA:  Fall earlier tonight with low back and right hip pain. EXAM: DG HIP (WITH OR WITHOUT PELVIS) 2-3V RIGHT COMPARISON:  03/05/2016 FINDINGS: There is mild diffuse decreased bone mineralization. Minimal symmetric degenerative change of the hips. No evidence of acute fracture or dislocation. Pelvic phleboliths. Degenerate change of the spine. IMPRESSION: No acute findings. Electronically  Signed   By: Marin Olp M.D.   On: 12/06/2017 20:24   Scheduled Meds: . amiodarone  200 mg Oral Daily  . amLODipine  5 mg Oral Daily  . aspirin EC  81 mg Oral Daily  . atenolol  25 mg Oral Daily  . heparin  5,000 Units Subcutaneous Q8H  . isosorbide mononitrate  30 mg Oral Daily  . levothyroxine  100 mcg Oral QAC breakfast  . polyvinyl alcohol  2 drop Both Eyes QHS   Continuous Infusions:   LOS: 1 day   Kerney Elbe, DO [facility] Pager [phone]  If 7PM-7AM, please contact night-coverage [email or web address] Password [facility] 12/08/2017, 2:33 PM

## 2017-12-09 ENCOUNTER — Inpatient Hospital Stay (HOSPITAL_COMMUNITY): Payer: Medicare Other

## 2017-12-09 DIAGNOSIS — I340 Nonrheumatic mitral (valve) insufficiency: Secondary | ICD-10-CM

## 2017-12-09 DIAGNOSIS — R945 Abnormal results of liver function studies: Secondary | ICD-10-CM

## 2017-12-09 LAB — COMPREHENSIVE METABOLIC PANEL
ALBUMIN: 2.6 g/dL — AB (ref 3.5–5.0)
ALK PHOS: 147 U/L — AB (ref 38–126)
ALT: 63 U/L — ABNORMAL HIGH (ref 14–54)
ANION GAP: 6 (ref 5–15)
AST: 53 U/L — ABNORMAL HIGH (ref 15–41)
BILIRUBIN TOTAL: 1 mg/dL (ref 0.3–1.2)
BUN: 23 mg/dL — AB (ref 6–20)
CALCIUM: 8.6 mg/dL — AB (ref 8.9–10.3)
CO2: 25 mmol/L (ref 22–32)
Chloride: 107 mmol/L (ref 101–111)
Creatinine, Ser: 1.33 mg/dL — ABNORMAL HIGH (ref 0.44–1.00)
GFR calc Af Amer: 40 mL/min — ABNORMAL LOW (ref >60)
GFR calc non Af Amer: 35 mL/min — ABNORMAL LOW (ref >60)
GLUCOSE: 87 mg/dL (ref 65–99)
Potassium: 4.3 mmol/L (ref 3.5–5.1)
SODIUM: 138 mmol/L (ref 135–145)
TOTAL PROTEIN: 5.6 g/dL — AB (ref 6.5–8.1)

## 2017-12-09 LAB — CBC WITH DIFFERENTIAL/PLATELET
BASOS PCT: 0 %
Basophils Absolute: 0 10*3/uL (ref 0.0–0.1)
EOS ABS: 0.1 10*3/uL (ref 0.0–0.7)
Eosinophils Relative: 1 %
HEMATOCRIT: 38.1 % (ref 36.0–46.0)
HEMOGLOBIN: 12.5 g/dL (ref 12.0–15.0)
Lymphocytes Relative: 22 %
Lymphs Abs: 1.8 10*3/uL (ref 0.7–4.0)
MCH: 30 pg (ref 26.0–34.0)
MCHC: 32.8 g/dL (ref 30.0–36.0)
MCV: 91.6 fL (ref 78.0–100.0)
MONOS PCT: 6 %
Monocytes Absolute: 0.5 10*3/uL (ref 0.1–1.0)
NEUTROS PCT: 71 %
Neutro Abs: 5.8 10*3/uL (ref 1.7–7.7)
Platelets: 207 10*3/uL (ref 150–400)
RBC: 4.16 MIL/uL (ref 3.87–5.11)
RDW: 15.9 % — ABNORMAL HIGH (ref 11.5–15.5)
WBC: 8.2 10*3/uL (ref 4.0–10.5)

## 2017-12-09 LAB — ECHOCARDIOGRAM COMPLETE
HEIGHTINCHES: 64 in
WEIGHTICAEL: 2088.2 [oz_av]

## 2017-12-09 LAB — MAGNESIUM: Magnesium: 2 mg/dL (ref 1.7–2.4)

## 2017-12-09 LAB — SEDIMENTATION RATE: Sed Rate: 22 mm/hr (ref 0–22)

## 2017-12-09 LAB — PHOSPHORUS: Phosphorus: 2 mg/dL — ABNORMAL LOW (ref 2.5–4.6)

## 2017-12-09 LAB — LACTATE DEHYDROGENASE: LDH: 183 U/L (ref 98–192)

## 2017-12-09 LAB — C-REACTIVE PROTEIN: CRP: 3.1 mg/dL — AB (ref <1.0)

## 2017-12-09 MED ORDER — POTASSIUM PHOSPHATES 15 MMOLE/5ML IV SOLN
10.0000 mmol | Freq: Once | INTRAVENOUS | Status: AC
  Administered 2017-12-09: 10 mmol via INTRAVENOUS
  Filled 2017-12-09: qty 3.33

## 2017-12-09 MED ORDER — SODIUM CHLORIDE 0.9 % IV BOLUS (SEPSIS)
250.0000 mL | Freq: Once | INTRAVENOUS | Status: AC
  Administered 2017-12-09: 250 mL via INTRAVENOUS

## 2017-12-09 MED ORDER — SODIUM CHLORIDE 0.9 % IV SOLN
INTRAVENOUS | Status: DC
  Administered 2017-12-09: 20:00:00 via INTRAVENOUS

## 2017-12-09 NOTE — Progress Notes (Signed)
PROGRESS NOTE    Jeanne Hart  HEN:[id number] DOB: 15-Nov-1926 DOA: 12/06/2017 PCP: [facility]   Brief Narrative:  Jeanne Hart is an 84 y.o. female past medical history of Essential Hypertension, paroxysmal atrial fibrillation not on anticoagulation, Hypothyroidism and other comorbids who presents after having a fall at home, she relates she has been lightheaded with palpitations and lightheadedness and palpitations, she relates about a 30 pound weight loss in the last 6 months. Admitted and currently being worked up and rehydrated and being worked up for her weight loss.   Assessment & Plan:   Principal Problem:   AKI (acute kidney injury) ([place]) Active Problems:   Essential hypertension   Paroxysmal atrial fibrillation [facility]): Not on Anticoagulation 2/2 GIB history.  CHA2DS2VASC =4   Hypothyroidism   CAD S/P percutaneous coronary angioplasty   Prolonged QT interval   Frequent falls  AKI (acute kidney injury) on CKD Stage 3 -With a baseline creatinine of 1.2, on admission 2.0 -Suspect prerenal in etiology, in the setting of Lasix and NSAIDs use.  Hold Lasix and NSAIDs for now -Creatinine is improved with IVF Hydration -BUN/Cr went from 29/1.14 -> 23/1.33 -Given another 250 mL Bolus and started IVF at 50 mL/hr given that patient will go for MRI  -Continue to Monitor and Repeat CMP in AM   Hypokalemia -K+ was 3.4 and improved to 4.4 -Continue to Monitor and Replete as Necessary -Repeat CMP in AM   Hypophosphatemia -Patient's Phos Level was 2.0 -Replete with IV KPhos 10 mmol -Continue to Monitor and Replete as Necessary -Repeat Phos Level in AM  Essential Hypertension -C/w Amlodipine 5 mg po Daily and with Atenolol 25 mg po daily -C/w Isosorbide Mononitrate 30 mg po Daily -Continue to Hold Lasix again   Paroxysmal Atrial Fibrillation [facility])  -Not on Anticoagulation 2/2 GIB history and Fall Risk.  CHA2DS2VASC =4 -Restarted Amiodarone at 200 mg po  Daily. -Will Hold Metoprolol 12.5-25 mg po PRN -Checked ECHOCardiogram and as below  -Place on Telemetry   Hypothyroidism -TSH was 1.297, Free T4 was 1.29 -CT Scan showed Enlarged Multinodular Goiter  -C/w Levothyroxine 100 mcg po Daily   Prolonged QT interval -QTc is now 484 -Continue to Monitor and Replete Electrolytes as Necessary -C/w Amiodarone 200 mg po Daily -Repeat EKG in AM   Generalized Weakness and Frequent Falls and associated Dizziness -PT/OT Evaluated and recommending SNF -Patient complained of Dizziness but was not Orthostatic -DG Lumbar Spine X-ray showed no acute findings; There was mild spondylosis of the Lumbar spine. Stable moderate T12 Compression Fx and Stable Mild L3 Compression Fx. -Urinalysis was Negative  -Try TED Hose  -Checking ECHOCardiogram as below -Head and Neck CT on admission showed No CT evidence for acute intracranial abnormality. Atrophy and small vessel ischemic changes of the white matter Moderate degenerative changes of the cervical spine without acute osseous abnormality.  Enlarged multinodular thyroid -Check Orthostatics in AM   Hx of CAD s/p PCI -C/w ASA 81 mg po Daily, Atenolol 25 mg po Daily, and Isosorbide Mononitrate 30 mg po Daily -Stopped Metoprolol 12.5-25 mg po PRN  Weight Loss -Unclear Etiology of Weight Loss over the last 6 months -Nutritionist Consulted -Checked CXR and showed Pulmonary vascular congestion without overt edema at this time. hronic cardiomegaly, Aortic Atherosclerosis -FOBT pending -CRP slightly high at 3.1, ESR was 22, and LDH normal 183 -Obtained CT Chest and Abd/Pelvis which read as Mild bilateral pleural effusions are noted with adjacent subsegmental atelectasis. Multifocal opacities are noted  throughout both lungs which may represent multifocal pneumonia. Mild sliding-type hiatal hernia. Coronary artery calcifications are noted suggesting coronary artery disease. Probable thyroid goiter. Diffusely  increased density is noted throughout hepatic parenchyma; differential includes hemochromatosis, Wilson's disease, glycogen storage disease or long-term amiodarone administration. Sigmoid diverticulosis without inflammation. Rounded sclerotic density is noted in the L1 vertebral body of indeterminate age. Further evaluation with MRI with and without gadolinium is recommended. Aortic Atherosclerosis -MRI Lumbar Spine ordered. -May discuss with GI in AM about diffusely increased density noted throughout hepatic parenchyma  -Repeat CBC, CMP, Mag, Phos Level in AM  -Appreciate Nutritionist Recc's   GERD -Holding Pantoprazole because of Prolong QTc  Chronic Insomnia -C/w Zolpidem 5 mg po qHSprn  Hx of IBS -C/w Loperamide PRN  Abnormal LFT's -? Related to finding on CT Abd/Pelvis -CT Abd/Pelvis showed Diffusely increased density is noted throughout hepatic parenchyma; differential includes hemochromatosis, Wilson's disease, glycogen storage disease or long-term amiodarone administration -Discuss with Gastroenterology in AM -Obtain RUQ U/S and Acute Hepatitis Panel -Repeat CMP in AM   DVT prophylaxis: Heparin 5,000 units sq q8h Code Status: FULL CODE Family Communication: No family present at bedside Disposition Plan: SNF when medically stable to D/C   Consultants:   None  Procedures:  ECHOCARDIOGRAM ------------------------------------------------------------------- Study Conclusions  - Left ventricle: The cavity size was normal. There was focal basal   hypertrophy. Systolic function was vigorous. The estimated   ejection fraction was in the range of 65% to 70%. Diastolic   function is abnormal, indeterminant grade. Wall motion was   normal; there were no regional wall motion abnormalities. - Aortic valve: Mildly calcified annulus. Trileaflet; mildly   thickened leaflets. Valve area (VTI): 1.93 cm^2. Valve area   (Vmax): 1.7 cm^2. Valve area (Vmean): 1.81 cm^2. - Mitral valve:  Mildly calcified annulus. Mildly thickened leaflets   . There was mild regurgitation. - Left atrium: The atrium was severely dilated. - Right atrium: The atrium was mildly dilated. - Pulmonary arteries: Systolic pressure was moderately increased.   PA peak pressure: 56 mm Hg (S). - Technically adequate study.    Antimicrobials:  Anti-infectives (From admission, onward)   None     Subjective: Seen and examined at bedside and felt ok. Did not sleep last night. No CP or SOB. Felt weak still and did not try to work with PT yesterday because of dizziness.   Objective: Vitals:   12/08/17 1414 12/08/17 2011 12/09/17 0430 12/09/17 1406  BP: (!) 113/56 111/60 108/65 (!) 155/64  Pulse: (!) 46 (!) 50 (!) 54 (!) 51  Resp: _0 Temp: 98 F (36.7 C) 98.4 F (36.9 C) 98.3 F (36.8 C) 97.6 F (36.4 C)  TempSrc: Oral Oral Oral Oral  SpO2: 90% 93% 98% 94%  Weight:      Height:        Intake/Output Summary (Last 24 hours) at 12/09/2017 2033 Last data filed at 12/09/2017 1800 Gross per 24 hour  Intake 1213.33 ml  Output 1250 ml  Net -36.67 ml   Filed Weights   12/07/17 0315  Weight: 59.2 kg (130 lb 8.2 oz)   Examination: Physical Exam:  Constitutional: Thin Elderly Caucasian female in NAD appears calm and comfortable; Slightly confused today  Eyes: Sclerae anicteric. Lids normal HENMT: Has bruise above left eye. External Ears and nose appear normal Neck: Appears supple with no JVD Respiratory: Diminished to auscultation but no appreciable wheezing/rales/rhonchi. Not tachypenic or using any accessory muscles to breathe Cardiovascular: Slightly bradycardic.  Slight Murmur. No appreciable edema Abdomen: Soft, NT, ND. Bowel sounds present GU: Deferred Musculoskeletal: No contractures; No cyanosis Skin: Has scattered ecchymosis throughout body. Warm and dry Neurologic: CN 2-12 grossly intact. No appreciable focal deficits Psychiatric: Slightly confused today. Awake and alert  x2. Normal and pleasant mood and affect.  Data Reviewed: I have personally reviewed following labs and imaging studies  CBC: Recent Labs  Lab 12/06/17 2211 12/07/17 0419 12/09/17 0447  WBC 9.2 7.5 8.2  NEUTROABS 7.8*  --  5.8  HGB 14.2 12.4 12.5  HCT 41.0 37.5 38.1  MCV 88.0 89.7 91.6  PLT 249 214 299   Basic Metabolic Panel: Recent Labs  Lab 12/06/17 2211 12/07/17 0419 12/07/17 1019 12/08/17 0416 12/09/17 0447  NA 140 144  --  142 138  K 4.1 3.4*  --  5.0 4.3  CL 102 108  --  113* 107  CO2 27 27  --  23 25  GLUCOSE 109* 83  --  80 87  BUN 55* 47*  --  29* 23*  CREATININE 2.07* 1.78*  --  1.14* 1.33*  CALCIUM 9.5 8.7*  --  8.5* 8.6*  MG  --   --  2.1 2.0 2.0  PHOS  --   --   --   --  2.0*   GFR: Estimated Creatinine Clearance: 25.2 mL/min (A) (by C-G formula based on SCr of 1.33 mg/dL (H)). Liver Function Tests: Recent Labs  Lab 12/09/17 0447  AST 53*  ALT 63*  ALKPHOS 147*  BILITOT 1.0  PROT 5.6*  ALBUMIN 2.6*   No results for input(s): LIPASE, AMYLASE in the last 168 hours. No results for input(s): AMMONIA in the last 168 hours. Coagulation Profile: No results for input(s): INR, PROTIME in the last 168 hours. Cardiac Enzymes: No results for input(s): CKTOTAL, CKMB, CKMBINDEX, TROPONINI in the last 168 hours. BNP (last 3 results) No results for input(s): PROBNP in the last 8760 hours. HbA1C: No results for input(s): HGBA1C in the last 72 hours. CBG: No results for input(s): GLUCAP in the last 168 hours. Lipid Profile: No results for input(s): CHOL, HDL, LDLCALC, TRIG, CHOLHDL, LDLDIRECT in the last 72 hours. Thyroid Function Tests: Recent Labs    12/07/17 0419  TSH 1.297  FREET4 1.29*   Anemia Panel: No results for input(s): VITAMINB12, FOLATE, FERRITIN, TIBC, IRON, RETICCTPCT in the last 72 hours. Sepsis Labs: No results for input(s): PROCALCITON, LATICACIDVEN in the last 168 hours.  No results found for this or any previous visit (from the  past 240 hour(s)).   Radiology Studies: Ct Abdomen Pelvis Wo Contrast  Result Date: 12/09/2017 CLINICAL DATA:  Weight loss, chronic dyspnea. EXAM: CT CHEST, ABDOMEN AND PELVIS WITHOUT CONTRAST TECHNIQUE: Multidetector CT imaging of the chest, abdomen and pelvis was performed following the standard protocol without IV contrast. COMPARISON:  None. FINDINGS: CT CHEST FINDINGS Cardiovascular: Atherosclerosis of thoracic aorta is noted without aneurysm formation. Coronary artery calcifications are noted. Normal cardiac size. No pericardial effusion. Mediastinum/Nodes: Mild sliding-type hiatal hernia is noted. Diffusely enlarged thyroid gland is noted. No significant adenopathy is noted. Lungs/Pleura: Mild bilateral pleural effusions are noted with adjacent subsegmental atelectasis. No pneumothorax is noted. Multifocal opacities are noted throughout both lungs which may represent multifocal pneumonia. Musculoskeletal: No chest wall mass or suspicious bone lesions identified. CT ABDOMEN PELVIS FINDINGS Hepatobiliary: Status post cholecystectomy. Diffusely increased density is noted throughout the hepatic parenchyma consistent with hemochromatosis, Wilson's disease, glycogen storage disease, or long-term amiodarone administration. Pancreas: Unremarkable.  No pancreatic ductal dilatation or surrounding inflammatory changes. Spleen: Normal in size without focal abnormality. Adrenals/Urinary Tract: Adrenal glands are unremarkable. Kidneys are normal, without renal calculi, focal lesion, or hydronephrosis. Bladder is unremarkable. Stomach/Bowel: The stomach appears normal. There is no evidence of bowel obstruction or inflammation. Sigmoid diverticulosis is noted without inflammation. Status post appendectomy. Vascular/Lymphatic: Aortic atherosclerosis. No enlarged abdominal or pelvic lymph nodes. Reproductive: Status post hysterectomy. No adnexal masses. Other: No hernia is noted.  No abnormal fluid collection is noted.  Musculoskeletal: Rounded sclerotic density is noted in the L1 vertebral body of indeterminate etiology. No fracture is noted. IMPRESSION: Mild bilateral pleural effusions are noted with adjacent subsegmental atelectasis. Multifocal opacities are noted throughout both lungs which may represent multifocal pneumonia. Mild sliding-type hiatal hernia. Coronary artery calcifications are noted suggesting coronary artery disease. Probable thyroid goiter. Diffusely increased density is noted throughout hepatic parenchyma; differential includes hemochromatosis, Wilson's disease, glycogen storage disease or long-term amiodarone administration. Sigmoid diverticulosis without inflammation. Rounded sclerotic density is noted in the L1 vertebral body of indeterminate age. Further evaluation with MRI with and without gadolinium is recommended. Aortic Atherosclerosis (ICD10-I70.0). Electronically Signed   By: Marijo Conception, M.D.   On: 12/09/2017 18:53   Ct Chest Wo Contrast  Result Date: 12/09/2017 CLINICAL DATA:  Weight loss, chronic dyspnea. EXAM: CT CHEST, ABDOMEN AND PELVIS WITHOUT CONTRAST TECHNIQUE: Multidetector CT imaging of the chest, abdomen and pelvis was performed following the standard protocol without IV contrast. COMPARISON:  None. FINDINGS: CT CHEST FINDINGS Cardiovascular: Atherosclerosis of thoracic aorta is noted without aneurysm formation. Coronary artery calcifications are noted. Normal cardiac size. No pericardial effusion. Mediastinum/Nodes: Mild sliding-type hiatal hernia is noted. Diffusely enlarged thyroid gland is noted. No significant adenopathy is noted. Lungs/Pleura: Mild bilateral pleural effusions are noted with adjacent subsegmental atelectasis. No pneumothorax is noted. Multifocal opacities are noted throughout both lungs which may represent multifocal pneumonia. Musculoskeletal: No chest wall mass or suspicious bone lesions identified. CT ABDOMEN PELVIS FINDINGS Hepatobiliary: Status post  cholecystectomy. Diffusely increased density is noted throughout the hepatic parenchyma consistent with hemochromatosis, Wilson's disease, glycogen storage disease, or long-term amiodarone administration. Pancreas: Unremarkable. No pancreatic ductal dilatation or surrounding inflammatory changes. Spleen: Normal in size without focal abnormality. Adrenals/Urinary Tract: Adrenal glands are unremarkable. Kidneys are normal, without renal calculi, focal lesion, or hydronephrosis. Bladder is unremarkable. Stomach/Bowel: The stomach appears normal. There is no evidence of bowel obstruction or inflammation. Sigmoid diverticulosis is noted without inflammation. Status post appendectomy. Vascular/Lymphatic: Aortic atherosclerosis. No enlarged abdominal or pelvic lymph nodes. Reproductive: Status post hysterectomy. No adnexal masses. Other: No hernia is noted.  No abnormal fluid collection is noted. Musculoskeletal: Rounded sclerotic density is noted in the L1 vertebral body of indeterminate etiology. No fracture is noted. IMPRESSION: Mild bilateral pleural effusions are noted with adjacent subsegmental atelectasis. Multifocal opacities are noted throughout both lungs which may represent multifocal pneumonia. Mild sliding-type hiatal hernia. Coronary artery calcifications are noted suggesting coronary artery disease. Probable thyroid goiter. Diffusely increased density is noted throughout hepatic parenchyma; differential includes hemochromatosis, Wilson's disease, glycogen storage disease or long-term amiodarone administration. Sigmoid diverticulosis without inflammation. Rounded sclerotic density is noted in the L1 vertebral body of indeterminate age. Further evaluation with MRI with and without gadolinium is recommended. Aortic Atherosclerosis (ICD10-I70.0). Electronically Signed   By: Marijo Conception, M.D.   On: 12/09/2017 18:53   Dg Chest Port 1 View  Result Date: 12/09/2017 CLINICAL DATA:  84 y/o female admitted  status post fall, weakness, acute  renal injury. EXAM: PORTABLE CHEST 1 VIEW COMPARISON:  Chest radiographs 10/19/2016. FINDINGS: Portable AP semi upright view at 0448 hours. Stable cardiomegaly and mediastinal contours. Calcified aortic atherosclerosis. Stable large lung volumes. Chronic but increased mild to moderate pulmonary interstitial opacity, that about the hilum most resembles vascular congestion today. No overt edema. No pneumothorax, pleural effusion or acute pulmonary opacity. Visualized tracheal air column is within normal limits. No acute osseous abnormality identified. IMPRESSION: 1. Pulmonary vascular congestion without overt edema at this time. 2. Chronic cardiomegaly, Aortic Atherosclerosis (ICD10-I70.0). Electronically Signed   By: Genevie Ann M.D.   On: 12/09/2017 06:39   Scheduled Meds: . amiodarone  200 mg Oral Daily  . amLODipine  5 mg Oral Daily  . aspirin EC  81 mg Oral Daily  . atenolol  25 mg Oral Daily  . heparin  5,000 Units Subcutaneous Q8H  . isosorbide mononitrate  30 mg Oral Daily  . levothyroxine  100 mcg Oral QAC breakfast  . polyvinyl alcohol  2 drop Both Eyes QHS   Continuous Infusions: . sodium chloride 50 mL/hr at 12/09/17 1945    LOS: 2 days   Kerney Elbe, DO [facility] Pager [phone]  If 7PM-7AM, please contact night-coverage [email or web address] Password [email or web address] 12/09/2017, 8:33 PM

## 2017-12-09 NOTE — Progress Notes (Signed)
  Echocardiogram 2D Echocardiogram has been performed.  Merrie Roof F 12/09/2017, 9:09 AM

## 2017-12-10 ENCOUNTER — Encounter (HOSPITAL_COMMUNITY): Payer: Self-pay | Admitting: *Deleted

## 2017-12-10 ENCOUNTER — Inpatient Hospital Stay (HOSPITAL_COMMUNITY): Payer: Medicare Other

## 2017-12-10 DIAGNOSIS — E860 Dehydration: Secondary | ICD-10-CM | POA: Diagnosis not present

## 2017-12-10 DIAGNOSIS — E876 Hypokalemia: Secondary | ICD-10-CM | POA: Diagnosis not present

## 2017-12-10 DIAGNOSIS — Z9861 Coronary angioplasty status: Secondary | ICD-10-CM | POA: Diagnosis not present

## 2017-12-10 DIAGNOSIS — R682 Dry mouth, unspecified: Secondary | ICD-10-CM | POA: Diagnosis not present

## 2017-12-10 DIAGNOSIS — N179 Acute kidney failure, unspecified: Secondary | ICD-10-CM | POA: Diagnosis not present

## 2017-12-10 DIAGNOSIS — K219 Gastro-esophageal reflux disease without esophagitis: Secondary | ICD-10-CM | POA: Diagnosis not present

## 2017-12-10 DIAGNOSIS — R05 Cough: Secondary | ICD-10-CM | POA: Diagnosis not present

## 2017-12-10 DIAGNOSIS — Z23 Encounter for immunization: Secondary | ICD-10-CM | POA: Diagnosis not present

## 2017-12-10 DIAGNOSIS — I10 Essential (primary) hypertension: Secondary | ICD-10-CM | POA: Diagnosis not present

## 2017-12-10 DIAGNOSIS — J189 Pneumonia, unspecified organism: Secondary | ICD-10-CM | POA: Diagnosis not present

## 2017-12-10 DIAGNOSIS — N183 Chronic kidney disease, stage 3 (moderate): Secondary | ICD-10-CM | POA: Diagnosis not present

## 2017-12-10 DIAGNOSIS — R9431 Abnormal electrocardiogram [ECG] [EKG]: Secondary | ICD-10-CM | POA: Diagnosis not present

## 2017-12-10 DIAGNOSIS — M6281 Muscle weakness (generalized): Secondary | ICD-10-CM | POA: Diagnosis not present

## 2017-12-10 DIAGNOSIS — I2510 Atherosclerotic heart disease of native coronary artery without angina pectoris: Secondary | ICD-10-CM | POA: Diagnosis not present

## 2017-12-10 DIAGNOSIS — I480 Paroxysmal atrial fibrillation: Secondary | ICD-10-CM | POA: Diagnosis not present

## 2017-12-10 DIAGNOSIS — R945 Abnormal results of liver function studies: Secondary | ICD-10-CM | POA: Diagnosis not present

## 2017-12-10 DIAGNOSIS — R5381 Other malaise: Secondary | ICD-10-CM | POA: Diagnosis not present

## 2017-12-10 DIAGNOSIS — R296 Repeated falls: Secondary | ICD-10-CM | POA: Diagnosis not present

## 2017-12-10 DIAGNOSIS — R634 Abnormal weight loss: Secondary | ICD-10-CM | POA: Diagnosis not present

## 2017-12-10 DIAGNOSIS — E039 Hypothyroidism, unspecified: Secondary | ICD-10-CM | POA: Diagnosis not present

## 2017-12-10 DIAGNOSIS — S3210XA Unspecified fracture of sacrum, initial encounter for closed fracture: Secondary | ICD-10-CM | POA: Diagnosis not present

## 2017-12-10 DIAGNOSIS — R262 Difficulty in walking, not elsewhere classified: Secondary | ICD-10-CM | POA: Diagnosis not present

## 2017-12-10 DIAGNOSIS — G4700 Insomnia, unspecified: Secondary | ICD-10-CM | POA: Diagnosis not present

## 2017-12-10 DIAGNOSIS — W19XXXA Unspecified fall, initial encounter: Secondary | ICD-10-CM | POA: Diagnosis not present

## 2017-12-10 DIAGNOSIS — R42 Dizziness and giddiness: Secondary | ICD-10-CM | POA: Diagnosis not present

## 2017-12-10 DIAGNOSIS — D631 Anemia in chronic kidney disease: Secondary | ICD-10-CM | POA: Diagnosis not present

## 2017-12-10 DIAGNOSIS — R531 Weakness: Secondary | ICD-10-CM | POA: Diagnosis not present

## 2017-12-10 DIAGNOSIS — E440 Moderate protein-calorie malnutrition: Secondary | ICD-10-CM | POA: Diagnosis not present

## 2017-12-10 DIAGNOSIS — R001 Bradycardia, unspecified: Secondary | ICD-10-CM | POA: Diagnosis not present

## 2017-12-10 LAB — CBC WITH DIFFERENTIAL/PLATELET
Basophils Absolute: 0 10*3/uL (ref 0.0–0.1)
Basophils Relative: 0 %
EOS ABS: 0.1 10*3/uL (ref 0.0–0.7)
EOS PCT: 1 %
HCT: 37.6 % (ref 36.0–46.0)
Hemoglobin: 12.4 g/dL (ref 12.0–15.0)
LYMPHS ABS: 1.8 10*3/uL (ref 0.7–4.0)
LYMPHS PCT: 22 %
MCH: 29.8 pg (ref 26.0–34.0)
MCHC: 33 g/dL (ref 30.0–36.0)
MCV: 90.4 fL (ref 78.0–100.0)
MONO ABS: 0.5 10*3/uL (ref 0.1–1.0)
MONOS PCT: 6 %
Neutro Abs: 5.8 10*3/uL (ref 1.7–7.7)
Neutrophils Relative %: 71 %
PLATELETS: 208 10*3/uL (ref 150–400)
RBC: 4.16 MIL/uL (ref 3.87–5.11)
RDW: 15.7 % — AB (ref 11.5–15.5)
WBC: 8.2 10*3/uL (ref 4.0–10.5)

## 2017-12-10 LAB — COMPREHENSIVE METABOLIC PANEL
ALT: 67 U/L — ABNORMAL HIGH (ref 14–54)
ANION GAP: 6 (ref 5–15)
AST: 56 U/L — ABNORMAL HIGH (ref 15–41)
Albumin: 2.6 g/dL — ABNORMAL LOW (ref 3.5–5.0)
Alkaline Phosphatase: 169 U/L — ABNORMAL HIGH (ref 38–126)
BUN: 17 mg/dL (ref 6–20)
CALCIUM: 8.8 mg/dL — AB (ref 8.9–10.3)
CHLORIDE: 109 mmol/L (ref 101–111)
CO2: 25 mmol/L (ref 22–32)
CREATININE: 1 mg/dL (ref 0.44–1.00)
GFR calc Af Amer: 57 mL/min — ABNORMAL LOW (ref >60)
GFR, EST NON AFRICAN AMERICAN: 49 mL/min — AB (ref >60)
Glucose, Bld: 89 mg/dL (ref 65–99)
Potassium: 4.1 mmol/L (ref 3.5–5.1)
SODIUM: 140 mmol/L (ref 135–145)
Total Bilirubin: 0.9 mg/dL (ref 0.3–1.2)
Total Protein: 5.8 g/dL — ABNORMAL LOW (ref 6.5–8.1)

## 2017-12-10 LAB — PHOSPHORUS: PHOSPHORUS: 2.7 mg/dL (ref 2.5–4.6)

## 2017-12-10 LAB — MAGNESIUM: MAGNESIUM: 2 mg/dL (ref 1.7–2.4)

## 2017-12-10 MED ORDER — ENSURE ENLIVE PO LIQD: 237.0000 mL | Freq: Two times a day (BID) | ORAL | 12 refills | Status: AC

## 2017-12-10 MED ORDER — GADOBENATE DIMEGLUMINE 529 MG/ML IV SOLN
15.0000 mL | Freq: Once | INTRAVENOUS | Status: AC | PRN
  Administered 2017-12-10: 12 mL via INTRAVENOUS

## 2017-12-10 MED ORDER — ENSURE ENLIVE PO LIQD
237.0000 mL | Freq: Two times a day (BID) | ORAL | Status: DC
  Administered 2017-12-10: 237 mL via ORAL

## 2017-12-10 NOTE — Care Management Important Message (Signed)
Important Message  Patient Details  Name: JOYDAN GRETZINGER MRN: [id number] Date of Birth: 1927/08/27   Medicare Important Message Given:  Yes    Kerin Salen 12/10/2017, 11:23 [place] Message  Patient Details  Name: HANAE WAITERS MRN: [id number] Date of Birth: 12/27/1926   Medicare Important Message Given:  Yes    Kerin Salen 12/10/2017, 11:23 AM

## 2017-12-10 NOTE — Progress Notes (Signed)
Initial Nutrition Assessment  DOCUMENTATION CODES:   Non-severe (moderate) malnutrition in context of chronic illness  INTERVENTION:   Ensure Enlive po BID, each supplement provides 350 kcal and 20 grams of protein  NUTRITION DIAGNOSIS:   Moderate Malnutrition related to chronic illness(IBS/chronic diarrhea) as evidenced by moderate muscle depletion, mild fat depletion.  GOAL:   Patient will meet greater than or equal to 90% of their needs  MONITOR:   PO intake, Supplement acceptance, Weight trends, I & O's, Labs  REASON FOR ASSESSMENT:   Consult Assessment of nutrition requirement/status, Diet education, Hip fracture protocol  ASSESSMENT:   Pt with PMH of IBS with reported chronic diarrhea, HTN, HLD, GERD, CAD s/p PCI, multiple recent falls, and chronic insomnia presents s/p fall found AKI r/t dehydration    Spoke with grandson and pt at bedside. Pt reports a great appetite PTA and currently.  Pt reports PTA she consumes 3 meals and occasional snacks per day. Meal completion records indicate pt consuming 50-100% of meals at this time. Grandson about to purchase pt lunch, sub from [employer].  Pt attributes her weight loss to her IBS, more specifically, her chronic diarrhea. Pt reports her diarrhea episodes fluctuate however a bad day consists of 3-4 episode. Pt reports significant weight loss over the past 2-3 years but states over the past year she has remained stable ~130 lbs.   Pt has tried Ensure in the past and is amenable to supplementation while admitted.   Labs reviewed; Phosphorus WNL (up from 2.0 yesterday), Albumin 2.6, CRP 3.1 Medications reviewed; NS at 50 mL/hr  NUTRITION - FOCUSED PHYSICAL EXAM:    Most Recent Value  Orbital Region  Severe depletion  Upper Arm Region  Mild depletion  Thoracic and Lumbar Region  Unable to assess  Buccal Region  Mild depletion  Temple Region  Moderate depletion  Clavicle Bone Region  Moderate depletion  Clavicle and  Acromion Bone Region  Moderate depletion  Scapular Bone Region  Unable to assess  Dorsal Hand  Moderate depletion  Patellar Region  No depletion  Anterior Thigh Region  No depletion  Posterior Calf Region  No depletion  Edema (RD Assessment)  None     Diet Order:  Diet Heart Room service appropriate? Yes; Fluid consistency: Thin  EDUCATION NEEDS:   No education needs have been identified at this time  Skin:  Skin Assessment: Reviewed RN Assessment  Last BM:  12/07/17  Height:   Ht Readings from Last 1 Encounters:  12/07/17 5\' 4"  (1.626 m)   Weight:   Wt Readings from Last 1 Encounters:  12/07/17 130 lb 8.2 oz (59.2 kg)   Ideal Body Weight:  54.5 kg  BMI:  Body mass index is 22.4 kg/m.  Estimated Nutritional Needs:   Kcal:  1500-1700  Protein:  75-85 grams  Fluid:  >/= 1.5 L/d  [place] Ranger, MS, RDN, LDN 12/10/2017 12:04 PM

## 2017-12-10 NOTE — Discharge Summary (Signed)
Physician Discharge Summary  ALIECE HONOLD Hart:[id number] DOB: 1927-06-29 DOA: 12/06/2017  PCP: [facility]  Admit date: 12/06/2017 Discharge date: 12/10/2017  Admitted From: Home Disposition: SNF  Recommendations for Outpatient Follow-up:  1. Follow up with PCP in 1-2 weeks 2. Follow up with Gastroenterology as an outpatient 3. Obtain RUQ U/S for Abnormal LFT's as an outpatient  4. Please obtain CMP/CBC, Mag, Phos in one week 5. Please follow up on the following pending results: Hepatitis Panel  Home Health: No  Equipment/Devices: None   Discharge Condition: Stable CODE STATUS: FULL CODE Diet recommendation: Heart Healthy Diet   Brief/Interim Summary: Jeanne Hart an 84 y.o.femalepast medical history of Essential Hypertension, paroxysmal atrial fibrillation not on anticoagulation, Hypothyroidism and other comorbids who presents after having a fall at home, she relates she has been lightheaded with palpitations and lightheadedness and palpitations, she relates about a 30 pound weight loss in the last 6 months. Admitted and currently being worked up and rehydrated as well as being worked up for her weight loss. Found to have have hepatic parenchyma density ? Related to Amiodarone vs other GI Etiology. Weight loss likely 2/2 to IBS. Patient's symptoms improved and she was no longer feeling dizzy after being hydrated. PT Evaluated and recommended SNF. She was deemed medically stable to D/C to SNF and follow up with GI and PCP as an outpatient to complete Weight loss workup and have Abnormal LFT's evaluated.   Discharge Diagnoses:  Principal Problem:   AKI (acute kidney injury) ([place]) Active Problems:   Essential hypertension   Paroxysmal atrial fibrillation [facility]): Not on Anticoagulation 2/2 GIB history.  CHA2DS2VASC =4   Hypothyroidism   CAD S/P percutaneous coronary angioplasty   Prolonged QT interval   Frequent falls   Malnutrition of moderate degree  AKI  (acute kidney injury) on CKD Stage 3, improved -With a baseline creatinine of 1.2, on admission 2.0 -Suspect prerenal in etiology, in the setting of Lasix and NSAIDs use.  -Continue to Hold Lasix and NSAIDs for now -Creatinine is improved with IVF Hydration -BUN/Cr went from 29/1.14 -> 23/1.33 -> 17/1.00 -Given another 250 mL Bolus and started IVF at 50 mL/hr given that patient patient went for MRI  -Continue to Monitor and Repeat CMP as an outpatient   Hypokalemia -K+ was 3.4 and improved to 4.1 -Continue to Monitor and Replete as Necessary -Repeat CMP in AM   Hypophosphatemia -Patient's Phos Level was 2.7 -Continue to Monitor and Replete as Necessary -Repeat Phos Level in AM  Essential Hypertension -C/w Amlodipine 5 mg po Daily and with Atenolol 25 mg po daily -C/w Isosorbide Mononitrate 30 mg po Daily -Continue Hold Lasix and follow up at [facility] for re-evaluation of it    Paroxysmal Atrial Fibrillation [facility])  -Not on Anticoagulation 2/2 GIB history and Fall Risk. CHA2DS2VASC =4 -Restarted Amiodarone at 200 mg po Daily. -Will Hold Metoprolol 12.5-25 mg po PRN -Checked ECHOCardiogram and as below  -Placed on Telemetry while hospitalized   Hypothyroidism -TSH was 1.297, Free T4 was 1.29 -CT Scan showed Enlarged Multinodular Goiter  -C/w Levothyroxine 100 mcg po Daily  -Follow up with PCP for further evaluation   Prolonged QT interval -QTc is now 484 -Continue to Monitor and Replete Electrolytes as Necessary -C/w Amiodarone 200 mg po Daily -Repeat EKG at SNF  Generalized Weakness and Frequent Falls and associated Dizziness -PT/OT Evaluated and recommending SNF -Patient complained of Dizziness but was not Orthostatic; Dizziness improved today  -DG Lumbar Spine X-ray showed  no acute findings; There was mild spondylosis of the Lumbar spine. Stable moderate T12 Compression Fx and Stable Mild L3 Compression Fx. -Urinalysis was Negative  -Try TED Hose  -Checking  ECHOCardiogram as below -Head and Neck CT on admission showed No CT evidence for acute intracranial abnormality. Atrophy and small vessel ischemic changes of the white matter Moderate degenerative changes of the cervical spine without acute osseous abnormality.  Enlarged multinodular thyroid -C/w PT/OT  Hx of CAD s/p PCI -C/w ASA 81 mg po Daily, Atenolol 25 mg po Daily, and Isosorbide Mononitrate 30 mg po Daily -Stopped Metoprolol 12.5-25 mg po PRN  Weight Loss -Unclear Etiology of Weight Loss over the last 6 months but suspect could be 2/2 to IBS? -Nutritionist Consulted and appreciateed Nutritionist Recc's  -Checked CXR and showed Pulmonary vascular congestion without overt edema at this time. hronic cardiomegaly, Aortic Atherosclerosis -FOBT pending -CRP slightly high at 3.1; ESR was 22, and LDH normal 183 -Obtained CT Chest and Abd/Pelvis which read as Mild bilateral pleural effusions are noted with adjacent subsegmental atelectasis. Multifocal opacities are noted throughout both lungs which may represent multifocal pneumonia. Mild sliding-type hiatal hernia. Coronary artery calcifications are noted suggesting coronary artery disease. Probable thyroid goiter. Diffusely increased density is noted throughout hepatic parenchyma; differential includes hemochromatosis, Wilson's disease, glycogen storage disease or long-term amiodarone administration. Sigmoid diverticulosis without inflammation. Rounded sclerotic density is noted in the L1 vertebral body of indeterminate age. Aortic Atherosclerosis -MRI Lumbar Spine ordered bilateral acute or subacute sacral ala fractures, left more prominent than right. There is also a subtle acute or subacute fracture through the anterior cortex of S2.  Benign lesion in L1, probably an atypical hemangioma. Three right foraminal and extraforaminal disc protrusion at L4-5 without focal neural impingement.  Degenerative changes between the spinous processes of L3 and  L4 which could be painful(Baastrup's disease).  -Follow up with Gastroenterology as an outpatient for further evaluation and recommendations for Increased Density in Hepatic Parenchyma, Abnormal LFT's and IBS -Repeat CBC, CMP, Mag, Phos Level at SNF -Follow up up with PCP for further evaluation   GERD -Held Pantoprazole because of Prolong QTc -Can be resumed at D/C at SNF if they repeat EKG and QTc is normal   Chronic Insomnia -C/w Home Zolpidem  Hx of IBS -C/w Loperamide PRN  Abnormal LFT's -AST went from 53 -> 56 and ALT went from 63 -> 67 -? Related to finding on CT Abd/Pelvis -CT Abd/Pelvis showed Diffusely increased density is noted throughout hepatic parenchyma; differential includes hemochromatosis, Wilson's disease, glycogen storage disease or long-term amiodarone administration -? Amiodarone Administration  -Follow up with Gastroenterology as an outpatient for further evaluation and workup -Ordered RUQ U/S but can be done as an outpatient -Acute Hepatitis Panel pending  -Repeat CMP in AM   Moderate Malnutrition in the Context of Chronic Illness -Nutritionist Consulted for further evaluation and appreciated recommendations -C/w Ensure Enlive po BID  Discharge Instructions Discharge Instructions    Call MD for:  difficulty breathing, headache or visual disturbances   Complete by:  As directed    Call MD for:  extreme fatigue   Complete by:  As directed    Call MD for:  hives   Complete by:  As directed    Call MD for:  persistant dizziness or light-headedness   Complete by:  As directed    Call MD for:  persistant nausea and vomiting   Complete by:  As directed    Call MD for:  redness,  tenderness, or signs of infection (pain, swelling, redness, odor or green/yellow discharge around incision site)   Complete by:  As directed    Call MD for:  severe uncontrolled pain   Complete by:  As directed    Call MD for:  temperature >100.4   Complete by:  As directed     Diet - low sodium heart healthy   Complete by:  As directed    Discharge instructions   Complete by:  As directed    Follow up with PCP and Gastroenterology as an outpatient. Take all medications as an outpatient.   Increase activity slowly   Complete by:  As directed      Allergies as of 12/10/2017      Reactions   Nexium [esomeprazole] Other (See Comments)   Aggravates IBS.    Statins Other (See Comments)   Had jaundice as child.  Does tolerate current statin therapy.     Azo [phenazopyridine] Other (See Comments)   On MAR   Codeine Other (See Comments)   Post-surgical reaction of unknown type.   Morphine And Related Other (See Comments)   Unknown.     Penicillins Other (See Comments)   Unknown. Has patient had a PCN reaction causing immediate rash, facial/tongue/throat swelling, SOB or lightheadedness with hypotension: YES Has patient had a PCN reaction causing severe rash involving mucus membranes or skin necrosis:NO Has patient had a PCN reaction that required hospitalization NO Has patient had a PCN reaction occurring within the last 10 years: NO If all of the above answers are "NO", then may proceed with Cephalosporin use.   Sulfa Antibiotics Other (See Comments)   Unknown childhood reaction.        Medication List    STOP taking these medications   ferrous sulfate 325 (65 FE) MG tablet   furosemide 20 MG tablet Commonly known as:  LASIX   meloxicam 7.5 MG tablet Commonly known as:  MOBIC   metoprolol tartrate 25 MG tablet Commonly known as:  LOPRESSOR   naproxen sodium 220 MG tablet Commonly known as:  ALEVE   UNABLE TO FIND     TAKE these medications   amiodarone 200 MG tablet Commonly known as:  PACERONE TAKE 1 TABLET BY MOUTH ONCE DAILY AND 1 TABLET AS NEEDED FOR FAST HEART RATE   amLODipine 5 MG tablet Commonly known as:  NORVASC Take 1 tablet by mouth daily. take 1 tab dialy   aspirin EC 81 MG tablet Take 1 tablet (81 mg total) by mouth daily.    atenolol 25 MG tablet Commonly known as:  TENORMIN Take 25 mg by mouth daily.   feeding supplement (ENSURE ENLIVE) Liqd Take 237 mLs by mouth 2 (two) times daily between meals. Start taking on:  12/11/2017   isosorbide mononitrate 30 MG 24 hr tablet Commonly known as:  IMDUR Take 1 tablet (30 mg total) by mouth daily.   levothyroxine 100 MCG tablet Commonly known as:  SYNTHROID, LEVOTHROID Take 100 mcg by mouth daily before breakfast.   nitroGLYCERIN 0.4 MG SL tablet Commonly known as:  NITROSTAT Place 1 tablet (0.4 mg total) under the tongue every 5 (five) minutes as needed for chest pain. MAX 3 doses   pantoprazole 40 MG tablet Commonly known as:  PROTONIX Take 40 mg by mouth daily.   potassium chloride 10 MEQ tablet Commonly known as:  K-DUR Take 1 tablet (10 mEq total) by mouth daily.   SYSTANE 0.4-0.3 % Soln Generic drug:  Polyethyl  Glycol-Propyl Glycol Apply 2 drops to eye at bedtime.   VESICARE PO Take 1 tablet by mouth daily.   VITAMIN C PO Take 1 tablet by mouth daily.   VITAMIN D PO Take 1 tablet by mouth daily.   zolpidem 12.5 MG CR tablet Commonly known as:  AMBIEN CR Take 12.5 mg by mouth every other day.      Contact information for after-discharge care    [facility] .   Service:  Skilled Nursing Contact information: [address] [address] [phone]             Allergies  Allergen Reactions  . Nexium [Esomeprazole] Other (See Comments)    Aggravates IBS.   . Statins Other (See Comments)    Had jaundice as child.  Does tolerate current statin therapy.    Abel Presto [Phenazopyridine] Other (See Comments)    On MAR  . Codeine Other (See Comments)    Post-surgical reaction of unknown type.  . Morphine And Related Other (See Comments)    Unknown.    Marland Kitchen Penicillins Other (See Comments)    Unknown. Has patient had a PCN reaction causing immediate rash, facial/tongue/throat  swelling, SOB or lightheadedness with hypotension: YES Has patient had a PCN reaction causing severe rash involving mucus membranes or skin necrosis:NO Has patient had a PCN reaction that required hospitalization NO Has patient had a PCN reaction occurring within the last 10 years: NO If all of the above answers are "NO", then may proceed with Cephalosporin use.     . Sulfa Antibiotics Other (See Comments)    Unknown childhood reaction.     Consultations:  None  Procedures/Studies: Ct Abdomen Pelvis Wo Contrast  Result Date: 12/09/2017 CLINICAL DATA:  Weight loss, chronic dyspnea. EXAM: CT CHEST, ABDOMEN AND PELVIS WITHOUT CONTRAST TECHNIQUE: Multidetector CT imaging of the chest, abdomen and pelvis was performed following the standard protocol without IV contrast. COMPARISON:  None. FINDINGS: CT CHEST FINDINGS Cardiovascular: Atherosclerosis of thoracic aorta is noted without aneurysm formation. Coronary artery calcifications are noted. Normal cardiac size. No pericardial effusion. Mediastinum/Nodes: Mild sliding-type hiatal hernia is noted. Diffusely enlarged thyroid gland is noted. No significant adenopathy is noted. Lungs/Pleura: Mild bilateral pleural effusions are noted with adjacent subsegmental atelectasis. No pneumothorax is noted. Multifocal opacities are noted throughout both lungs which may represent multifocal pneumonia. Musculoskeletal: No chest wall mass or suspicious bone lesions identified. CT ABDOMEN PELVIS FINDINGS Hepatobiliary: Status post cholecystectomy. Diffusely increased density is noted throughout the hepatic parenchyma consistent with hemochromatosis, Wilson's disease, glycogen storage disease, or long-term amiodarone administration. Pancreas: Unremarkable. No pancreatic ductal dilatation or surrounding inflammatory changes. Spleen: Normal in size without focal abnormality. Adrenals/Urinary Tract: Adrenal glands are unremarkable. Kidneys are normal, without renal calculi,  focal lesion, or hydronephrosis. Bladder is unremarkable. Stomach/Bowel: The stomach appears normal. There is no evidence of bowel obstruction or inflammation. Sigmoid diverticulosis is noted without inflammation. Status post appendectomy. Vascular/Lymphatic: Aortic atherosclerosis. No enlarged abdominal or pelvic lymph nodes. Reproductive: Status post hysterectomy. No adnexal masses. Other: No hernia is noted.  No abnormal fluid collection is noted. Musculoskeletal: Rounded sclerotic density is noted in the L1 vertebral body of indeterminate etiology. No fracture is noted. IMPRESSION: Mild bilateral pleural effusions are noted with adjacent subsegmental atelectasis. Multifocal opacities are noted throughout both lungs which may represent multifocal pneumonia. Mild sliding-type hiatal hernia. Coronary artery calcifications are noted suggesting coronary artery disease. Probable thyroid goiter. Diffusely increased density is  noted throughout hepatic parenchyma; differential includes hemochromatosis, Wilson's disease, glycogen storage disease or long-term amiodarone administration. Sigmoid diverticulosis without inflammation. Rounded sclerotic density is noted in the L1 vertebral body of indeterminate age. Further evaluation with MRI with and without gadolinium is recommended. Aortic Atherosclerosis (ICD10-I70.0). Electronically Signed   By: Marijo Conception, M.D.   On: 12/09/2017 18:53   Dg Lumbar Spine Complete  Result Date: 12/06/2017 CLINICAL DATA:  Fall earlier tonight with low back pain radiating to right hip. EXAM: LUMBAR SPINE - COMPLETE 4+ VIEW COMPARISON:  11/26/2017 FINDINGS: Mild diffuse osteopenia. Subtle curvature of the lumbar spine convex left unchanged. There is mild spondylosis of the lumbar spine. Stable mild L3 compression fracture and stable moderate T12 compression fracture. No new compression fracture or spondylolisthesis. There is facet arthropathy over the lower lumbar spine. Calcified  plaque over the thoracoabdominal aorta. Mild degenerative change of the hips. IMPRESSION: No acute findings. Mild spondylosis of the lumbar spine. Stable moderate T12 compression fracture and stable mild L3 compression fracture. Electronically Signed   By: Marin Olp M.D.   On: 12/06/2017 20:23   Dg Elbow Complete Left  Result Date: 12/01/2017 CLINICAL DATA:  unwitnessed fall today. Pt states she was coming back from bathroom trying to sit down in chair when she fell. Bruising to left elbow noted. EXAM: LEFT ELBOW - COMPLETE 3+ VIEW COMPARISON:  None. FINDINGS: No fracture.  No bone lesion. The elbow joint is normally spaced and aligned. No degenerative/arthropathic change. No joint effusion. Soft tissues are unremarkable. IMPRESSION: Negative. Electronically Signed   By: Lajean Manes M.D.   On: 12/01/2017 17:02   Ct Head Wo Contrast  Result Date: 12/06/2017 CLINICAL DATA:  Fall EXAM: CT HEAD WITHOUT CONTRAST CT CERVICAL SPINE WITHOUT CONTRAST TECHNIQUE: Multidetector CT imaging of the head and cervical spine was performed following the standard protocol without intravenous contrast. Multiplanar CT image reconstructions of the cervical spine were also generated. COMPARISON:  CT brain 12/01/2017 FINDINGS: CT HEAD FINDINGS Brain: No acute territorial infarction, hemorrhage or intracranial mass. Probable old lacunar infarct left basal ganglia. Moderate small vessel ischemic changes of the white matter. Moderate atrophy. Stable ventricle size Vascular: No hyperdense vessels.  Carotid vascular calcification Skull: No fracture Sinuses/Orbits: No acute finding. Other: None CT CERVICAL SPINE FINDINGS Alignment: No subluxation.  Facet alignment within normal limits Skull base and vertebrae: No acute fracture. No primary bone lesion or focal pathologic process. Soft tissues and spinal canal: No prevertebral fluid or swelling. No visible canal hematoma. Disc levels: Moderate degenerative changes at C3-C4, C4-C5,  C5-C6 and C6-C7. Multiple level bilateral foraminal stenosis, most marked on the right side at C4-C5 with moderate narrowing bilaterally at C3 C4. Upper chest: Enlarged nodular thyroid. Other: None IMPRESSION: 1. No CT evidence for acute intracranial abnormality. Atrophy and small vessel ischemic changes of the white matter 2. Moderate degenerative changes of the cervical spine without acute osseous abnormality 3. Enlarged multinodular thyroid. Electronically Signed   By: Donavan Foil M.D.   On: 12/06/2017 20:38   Ct Head Wo Contrast  Result Date: 12/01/2017 CLINICAL DATA:  84 year old female with head trauma. EXAM: CT HEAD WITHOUT CONTRAST TECHNIQUE: Contiguous axial images were obtained from the base of the skull through the vertex without intravenous contrast. COMPARISON:  None. FINDINGS: Brain: No evidence of acute infarction, hemorrhage, hydrocephalus, extra-axial collection or mass lesion/mass effect. Atrophy and chronic small-vessel white matter ischemic changes noted. Vascular: Atherosclerotic calcifications noted. Skull: Normal. Negative for fracture or focal lesion. Sinuses/Orbits:  No acute abnormality Other: None. IMPRESSION: 1. No evidence of acute intracranial abnormality 2. Atrophy and chronic small-vessel white matter ischemic changes. Electronically Signed   By: Margarette Canada M.D.   On: 12/01/2017 17:09   Ct Chest Wo Contrast  Result Date: 12/09/2017 CLINICAL DATA:  Weight loss, chronic dyspnea. EXAM: CT CHEST, ABDOMEN AND PELVIS WITHOUT CONTRAST TECHNIQUE: Multidetector CT imaging of the chest, abdomen and pelvis was performed following the standard protocol without IV contrast. COMPARISON:  None. FINDINGS: CT CHEST FINDINGS Cardiovascular: Atherosclerosis of thoracic aorta is noted without aneurysm formation. Coronary artery calcifications are noted. Normal cardiac size. No pericardial effusion. Mediastinum/Nodes: Mild sliding-type hiatal hernia is noted. Diffusely enlarged thyroid gland is  noted. No significant adenopathy is noted. Lungs/Pleura: Mild bilateral pleural effusions are noted with adjacent subsegmental atelectasis. No pneumothorax is noted. Multifocal opacities are noted throughout both lungs which may represent multifocal pneumonia. Musculoskeletal: No chest wall mass or suspicious bone lesions identified. CT ABDOMEN PELVIS FINDINGS Hepatobiliary: Status post cholecystectomy. Diffusely increased density is noted throughout the hepatic parenchyma consistent with hemochromatosis, Wilson's disease, glycogen storage disease, or long-term amiodarone administration. Pancreas: Unremarkable. No pancreatic ductal dilatation or surrounding inflammatory changes. Spleen: Normal in size without focal abnormality. Adrenals/Urinary Tract: Adrenal glands are unremarkable. Kidneys are normal, without renal calculi, focal lesion, or hydronephrosis. Bladder is unremarkable. Stomach/Bowel: The stomach appears normal. There is no evidence of bowel obstruction or inflammation. Sigmoid diverticulosis is noted without inflammation. Status post appendectomy. Vascular/Lymphatic: Aortic atherosclerosis. No enlarged abdominal or pelvic lymph nodes. Reproductive: Status post hysterectomy. No adnexal masses. Other: No hernia is noted.  No abnormal fluid collection is noted. Musculoskeletal: Rounded sclerotic density is noted in the L1 vertebral body of indeterminate etiology. No fracture is noted. IMPRESSION: Mild bilateral pleural effusions are noted with adjacent subsegmental atelectasis. Multifocal opacities are noted throughout both lungs which may represent multifocal pneumonia. Mild sliding-type hiatal hernia. Coronary artery calcifications are noted suggesting coronary artery disease. Probable thyroid goiter. Diffusely increased density is noted throughout hepatic parenchyma; differential includes hemochromatosis, Wilson's disease, glycogen storage disease or long-term amiodarone administration. Sigmoid  diverticulosis without inflammation. Rounded sclerotic density is noted in the L1 vertebral body of indeterminate age. Further evaluation with MRI with and without gadolinium is recommended. Aortic Atherosclerosis (ICD10-I70.0). Electronically Signed   By: Marijo Conception, M.D.   On: 12/09/2017 18:53   Ct Cervical Spine Wo Contrast  Result Date: 12/06/2017 CLINICAL DATA:  Fall EXAM: CT HEAD WITHOUT CONTRAST CT CERVICAL SPINE WITHOUT CONTRAST TECHNIQUE: Multidetector CT imaging of the head and cervical spine was performed following the standard protocol without intravenous contrast. Multiplanar CT image reconstructions of the cervical spine were also generated. COMPARISON:  CT brain 12/01/2017 FINDINGS: CT HEAD FINDINGS Brain: No acute territorial infarction, hemorrhage or intracranial mass. Probable old lacunar infarct left basal ganglia. Moderate small vessel ischemic changes of the white matter. Moderate atrophy. Stable ventricle size Vascular: No hyperdense vessels.  Carotid vascular calcification Skull: No fracture Sinuses/Orbits: No acute finding. Other: None CT CERVICAL SPINE FINDINGS Alignment: No subluxation.  Facet alignment within normal limits Skull base and vertebrae: No acute fracture. No primary bone lesion or focal pathologic process. Soft tissues and spinal canal: No prevertebral fluid or swelling. No visible canal hematoma. Disc levels: Moderate degenerative changes at C3-C4, C4-C5, C5-C6 and C6-C7. Multiple level bilateral foraminal stenosis, most marked on the right side at C4-C5 with moderate narrowing bilaterally at C3 C4. Upper chest: Enlarged nodular thyroid. Other: None IMPRESSION: 1. No CT evidence  for acute intracranial abnormality. Atrophy and small vessel ischemic changes of the white matter 2. Moderate degenerative changes of the cervical spine without acute osseous abnormality 3. Enlarged multinodular thyroid. Electronically Signed   By: Donavan Foil M.D.   On: 12/06/2017 20:38    Mr Lumbar Spine W Wo Contrast  Result Date: 12/10/2017 CLINICAL DATA:  Back pain secondary to a fall. EXAM: MRI LUMBAR SPINE WITHOUT AND WITH CONTRAST TECHNIQUE: Multiplanar and multiecho pulse sequences of the lumbar spine were obtained without and with intravenous contrast. CONTRAST:  49m MULTIHANCE GADOBENATE DIMEGLUMINE 529 MG/ML IV SOLN COMPARISON:  Radiographs dated 12/06/2017 and 11/26/2017 and CT scan dated 12/09/2017 FINDINGS: Segmentation:  Standard. Alignment:  Physiologic. Vertebrae: There is a 2.3 cm diameter well-defined nonenhancing lesion in the L1 vertebral body consistent with an atypical benign hemangioma. There is a subtle fracture through the S2 segment of the sacrum with adjacent edema. Bilateral sacral ala fractures. Conus medullaris and cauda equina: Conus extends to the L1-2 level. Conus and cauda equina appear normal. Paraspinal and other soft tissues: Common bile duct is dilated to 15 mm with slight intrahepatic ductal dilatation. Gallbladder has been removed. Multiple small parapelvic cysts in the left kidney. Tiny cysts right kidney. Atrophy of the posterior paraspinal musculature. Disc levels: T11-12: Old healed compression fracture of the superior endplate of TE45 No protrusion of disc into the spinal canal. Minimal protrusion of the posterosuperior aspect of the T12 vertebral body into the spinal canal without neural impingement. T12-L1: Small central disc bulge with no neural impingement. L1-2: Tiny disc protrusion just to the right of midline with no neural impingement. L2-3: Old Schmorl's node in the superior endplate of L3. Small broad-based disc bulge with accompanying osteophytes with no neural impingement. L3-4: Tiny broad-based disc bulge with no neural impingement. Degenerative changes between the spinous processes of L3 and L4 with slight enhancement after contrast administration. L4-5: Small right foraminal and extraforaminal disc protrusion without neural  impingement. Slight hypertrophy of the facet joints, right more than left. No focal impingement. Slight narrowing of the right lateral recess. L5-S1: Small disc bulge to the left of midline extending into the left neural foramen without neural impingement. Bilateral sacral ala fractures, more prominent on the left than the right. IMPRESSION: 1. Bilateral acute or subacute sacral ala fractures, left more prominent than right. There is also a subtle acute or subacute fracture through the anterior cortex of S2. 2. Benign lesion in L1, probably an atypical hemangioma. Three right foraminal and extraforaminal disc protrusion at L4-5 without focal neural impingement. 3. 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 Electronically Signed   By: JLorriane ShireM.D.   On: 12/10/2017 08:56   Dg Chest Port 1 View  Result Date: 12/09/2017 CLINICAL DATA:  84y/o female admitted status post fall, weakness, acute renal injury. EXAM: PORTABLE CHEST 1 VIEW COMPARISON:  Chest radiographs 10/19/2016. FINDINGS: Portable AP semi upright view at 0448 hours. Stable cardiomegaly and mediastinal contours. Calcified aortic atherosclerosis. Stable large lung volumes. Chronic but increased mild to moderate pulmonary interstitial opacity, that about the hilum most resembles vascular congestion today. No overt edema. No pneumothorax, pleural effusion or acute pulmonary opacity. Visualized tracheal air column is within normal limits. No acute osseous abnormality identified. IMPRESSION: 1. Pulmonary vascular congestion without overt edema at this time. 2. Chronic cardiomegaly, Aortic Atherosclerosis (ICD10-I70.0). Electronically Signed   By: HGenevie AnnM.D.   On: 12/09/2017 06:39   Dg  Hip Unilat W Or Wo Pelvis 2-3 Views Right  Result Date: 12/06/2017 CLINICAL DATA:  Fall earlier tonight with low back and right hip pain. EXAM: DG HIP (WITH OR WITHOUT PELVIS) 2-3V RIGHT COMPARISON:   03/05/2016 FINDINGS: There is mild diffuse decreased bone mineralization. Minimal symmetric degenerative change of the hips. No evidence of acute fracture or dislocation. Pelvic phleboliths. Degenerate change of the spine. IMPRESSION: No acute findings. Electronically Signed   By: Marin Olp M.D.   On: 12/06/2017 20:24   ECHOCARDIOGRAM 12/09/17 ------------------------------------------------------------------- Study Conclusions  - Left ventricle: The cavity size was normal. There was focal basal   hypertrophy. Systolic function was vigorous. The estimated   ejection fraction was in the range of 65% to 70%. Diastolic   function is abnormal, indeterminant grade. Wall motion was   normal; there were no regional wall motion abnormalities. - Aortic valve: Mildly calcified annulus. Trileaflet; mildly   thickened leaflets. Valve area (VTI): 1.93 cm^2. Valve area   (Vmax): 1.7 cm^2. Valve area (Vmean): 1.81 cm^2. - Mitral valve: Mildly calcified annulus. Mildly thickened leaflets   . There was mild regurgitation. - Left atrium: The atrium was severely dilated. - Right atrium: The atrium was mildly dilated. - Pulmonary arteries: Systolic pressure was moderately increased.   PA peak pressure: 56 mm Hg (S). - Technically adequate study.  Subjective: Seen and examined and felt improved. No CP or SOB. No nausea, vomiting or lightheadedness. Did not feel dizzy today.  Discharge Exam: Vitals:   12/10/17 0508 12/10/17 0841  BP: (!) 143/68   Pulse: (!) 58 60  Resp: 18   Temp: 97.9 F (36.6 C)   SpO2: 93%    Vitals:   12/09/17 1406 12/09/17 2200 12/10/17 0508 12/10/17 0841  BP: (!) 155/64 (!) 165/76 (!) 143/68   Pulse: (!) 51 (!) 57 (!) 58 60  Resp: 18 20 18    Temp: 97.6 F (36.4 C) 97.8 F (36.6 C) 97.9 F (36.6 C)   TempSrc: Oral Oral Oral   SpO2: 94% 96% 93%   Weight:      Height:       General: Pt is alert, awake, not in acute distress Cardiovascular: RRR, S1/S2 +, no rubs, no  gallops Respiratory: Diminished bilaterally, no wheezing, no rhonchi Abdominal: Soft, NT, ND, bowel sounds + Extremities: no LE edema, no cyanosis  The results of significant diagnostics from this hospitalization (including imaging, microbiology, ancillary and laboratory) are listed below for reference.    Microbiology: No results found for this or any previous visit (from the past 240 hour(s)).   Labs: BNP (last 3 results) No results for input(s): BNP in the last 8760 hours. Basic Metabolic Panel: Recent Labs  Lab 12/06/17 2211 12/07/17 0419 12/07/17 1019 12/08/17 0416 12/09/17 0447 12/10/17 0439  NA 140 144  --  142 138 140  K 4.1 3.4*  --  5.0 4.3 4.1  CL 102 108  --  113* 107 109  CO2 27 27  --  23 25 25   GLUCOSE 109* 83  --  80 87 89  BUN 55* 47*  --  29* 23* 17  CREATININE 2.07* 1.78*  --  1.14* 1.33* 1.00  CALCIUM 9.5 8.7*  --  8.5* 8.6* 8.8*  MG  --   --  2.1 2.0 2.0 2.0  PHOS  --   --   --   --  2.0* 2.7   Liver Function Tests: Recent Labs  Lab 12/09/17 0447 12/10/17 0439  AST  53* 56*  ALT 63* 67*  ALKPHOS 147* 169*  BILITOT 1.0 0.9  PROT 5.6* 5.8*  ALBUMIN 2.6* 2.6*   No results for input(s): LIPASE, AMYLASE in the last 168 hours. No results for input(s): AMMONIA in the last 168 hours. CBC: Recent Labs  Lab 12/06/17 2211 12/07/17 0419 12/09/17 0447 12/10/17 0439  WBC 9.2 7.5 8.2 8.2  NEUTROABS 7.8*  --  5.8 5.8  HGB 14.2 12.4 12.5 12.4  HCT 41.0 37.5 38.1 37.6  MCV 88.0 89.7 91.6 90.4  PLT 249 214 207 208   Cardiac Enzymes: No results for input(s): CKTOTAL, CKMB, CKMBINDEX, TROPONINI in the last 168 hours. BNP: Invalid input(s): POCBNP CBG: No results for input(s): GLUCAP in the last 168 hours. D-Dimer No results for input(s): DDIMER in the last 72 hours. Hgb A1c No results for input(s): HGBA1C in the last 72 hours. Lipid Profile No results for input(s): CHOL, HDL, LDLCALC, TRIG, CHOLHDL, LDLDIRECT in the last 72 hours. Thyroid  function studies No results for input(s): TSH, T4TOTAL, T3FREE, THYROIDAB in the last 72 hours.  Invalid input(s): FREET3 Anemia work up No results for input(s): VITAMINB12, FOLATE, FERRITIN, TIBC, IRON, RETICCTPCT in the last 72 hours. Urinalysis    Component Value Date/Time   COLORURINE YELLOW 12/06/2017 2211   APPEARANCEUR CLEAR 12/06/2017 2211   LABSPEC 1.005 12/06/2017 2211   PHURINE 6.0 12/06/2017 2211   GLUCOSEU NEGATIVE 12/06/2017 2211   HGBUR SMALL (A) 12/06/2017 2211   BILIRUBINUR NEGATIVE 12/06/2017 2211   KETONESUR NEGATIVE 12/06/2017 2211   PROTEINUR NEGATIVE 12/06/2017 2211   UROBILINOGEN 0.2 05/10/2015 1741   NITRITE NEGATIVE 12/06/2017 2211   LEUKOCYTESUR SMALL (A) 12/06/2017 2211   Sepsis Labs Invalid input(s): PROCALCITONIN,  WBC,  LACTICIDVEN Microbiology No results found for this or any previous visit (from the past 240 hour(s)).  Time coordinating discharge: 35 minutes  SIGNED:  Kerney Elbe, DO [facility] 12/10/2017, 2:37 PM Pager [phone]  If 7PM-7AM, please contact night-coverage [email or web address] Password [email or web address]

## 2017-12-10 NOTE — Clinical Social Work Placement (Addendum)
Pt discharged with plan to admit to [facility]- report [id number]. Room #122  Pt's grandson transporting her. At bedside and agreeable to plan.  DC information sent via the [place].  Of note- pt's social security number on file incorrect- verified correct SS# with PASSR- [id number]   CLINICAL SOCIAL WORK PLACEMENT  NOTE  Date:  12/10/2017  Patient Details  Name: [name] MRN: [id number] Date of Birth: 1927-09-21  Clinical Social Work is seeking post-discharge placement for this patient at the [place] level of care (*CSW will initial, date and re-position this form in  chart as items are completed):  Yes   Patient/family provided with [place] Work Department's list of facilities offering this level of care within the geographic area requested by the patient (or if unable, by the patient's family).  Yes   Patient/family informed of their freedom to choose among providers that offer the needed level of care, that participate in Medicare, Medicaid or managed care program needed by the patient, have an available bed and are willing to accept the patient.  Yes   Patient/family informed of [facility]'s ownership interest in [facility] and [facility], as well as of the fact that they are under no obligation to receive care at these facilities.  PASRR submitted to EDS on 12/10/17     PASRR number received on 12/10/17     Existing PASRR number confirmed on       FL2 transmitted to all facilities in geographic area requested by pt/family on 12/07/17     FL2 transmitted to all facilities within larger geographic area on       Patient informed that his/her managed care company has contracts with or will negotiate with certain facilities, including the following:  [facility]     Yes   Patient/family informed of bed offers received.  Patient chooses bed at [facility]     Physician recommends and patient  chooses bed at [facility]    Patient to be transferred to [facility] on 12/10/17.  Patient to be transferred to facility by grandson Joey     Patient family notified on 12/10/17 of transfer.  Name of family member notified:  Grandson Joey     PHYSICIAN       Additional Comment:    _______________________________________________ Nila Nephew, LCSW 12/10/2017, 3:03 PM

## 2017-12-10 NOTE — Plan of Care (Signed)
  Progressing Education: Knowledge of General Education information will improve 12/10/2017 0857 - Progressing by Kealie Barrie, Royetta Crochet, RN Clinical Measurements: Ability to maintain clinical measurements within normal limits will improve 12/10/2017 0857 - Progressing by Jamear Carbonneau, Royetta Crochet, RN Will remain free from infection 12/10/2017 0857 - Progressing by Danel Requena, Royetta Crochet, RN Diagnostic test results will improve 12/10/2017 0857 - Progressing by Thetis Schwimmer, Royetta Crochet, RN Respiratory complications will improve 12/10/2017 0857 - Progressing by Dohn Stclair, Royetta Crochet, RN Cardiovascular complication will be avoided 12/10/2017 0857 - Progressing by Cortlyn Cannell, Royetta Crochet, RN Activity: Risk for activity intolerance will decrease 12/10/2017 0857 - Progressing by Mabelle Mungin, Royetta Crochet, RN Nutrition: Adequate nutrition will be maintained 12/10/2017 0857 - Progressing by Belanna Manring, Royetta Crochet, RN Coping: Level of anxiety will decrease 12/10/2017 0857 - Progressing by Francenia Chimenti, Royetta Crochet, RN Elimination: Will not experience complications related to bowel motility 12/10/2017 0857 - Progressing by Clarine Elrod, Royetta Crochet, RN Will not experience complications related to urinary retention 12/10/2017 0857 - Progressing by Kaimani Clayson, Royetta Crochet, RN Pain Managment: General experience of comfort will improve 12/10/2017 0857 - Progressing by Jah Alarid, Royetta Crochet, RN Safety: Ability to remain free from injury will improve 12/10/2017 0857 - Progressing by Annaleia Pence, Royetta Crochet, RN Skin Integrity: Risk for impaired skin integrity will decrease 12/10/2017 0857 - Progressing by Carmina Walle, Royetta Crochet, RN

## 2017-12-10 NOTE — NC FL2 (Addendum)
[place] MEDICAID FL2 LEVEL OF CARE SCREENING TOOL     IDENTIFICATION  Patient Name: Jeanne Hart Birthdate: 10/11/1926 Sex: female Admission Date (Current Location): 12/06/2017  [facility] and [place] Number:  Herbalist and Address:  [facility],  [place], [place]      Provider Number: [id number]  Attending Physician Name and Address:  Kerney Elbe, DO  Relative Name and Phone Number:       Current Level of Care: Hospital Recommended Level of Care: [place] Prior Approval Number:    Date Approved/Denied:12/10/17   PASRR Number:   [id number]   Discharge Plan: SNF    Current Diagnoses: Patient Active Problem List   Diagnosis Date Noted  . Malnutrition of moderate degree 12/10/2017  . Prolonged QT interval 12/07/2017  . AKI (acute kidney injury) ([place]) 12/07/2017  . Frequent falls 12/07/2017  . Weakness generalized 10/19/2016  . CKD (chronic kidney disease) stage 3, GFR 30-59 ml/min (HCC) 10/19/2016  . Elevated LFTs 10/19/2016  . Thrombocytopenia ([place]) 10/19/2016  . Nonspecific abnormal finding in stool contents 05/30/2015  . Foot swelling 02/18/2015  . DOE (dyspnea on exertion) 12/10/2014  . CAD S/P percutaneous coronary angioplasty   . Heme positive stool 04/03/2014  . Anemia, unspecified 04/03/2014  . Diarrhea 04/03/2014  . History of unstable angina 03/23/2014  . Hypothyroidism 03/23/2014  . Essential hypertension 04/29/2013  . Hyperlipidemia with target LDL less than 70 04/29/2013  . Paroxysmal atrial fibrillation [facility]): Not on Anticoagulation 2/2 GIB history.  CHA2DS2VASC =4 04/29/2013  . History of GI bleed 04/29/2013  . Palpitations 04/29/2013    Orientation RESPIRATION BLADDER Height & Weight     Self, Time, Situation, Place  Normal Continent Weight: 130 lb 8.2 oz (59.2 kg) Height:  5\' 4"  (162.6 cm)  BEHAVIORAL SYMPTOMS/MOOD NEUROLOGICAL BOWEL NUTRITION STATUS      Continent  Diet(heart healthy )  AMBULATORY STATUS COMMUNICATION OF NEEDS Skin   Limited Assist Verbally                         Personal Care Assistance Level of Assistance  Bathing, Feeding, Dressing Bathing Assistance: Limited assistance Feeding assistance: Independent Dressing Assistance: Limited assistance     Functional Limitations Info  Sight, Hearing, Speech Sight Info: Adequate Hearing Info: Impaired(can be hard of hearing) Speech Info: Adequate    SPECIAL CARE FACTORS FREQUENCY  PT (By licensed PT), OT (By licensed OT)     PT Frequency: 5x wk OT Frequency: 5x wk            Contractures Contractures Info: Not present    Additional Factors Info  Code Status, Allergies Code Status Info: Full Code Allergies Info: NEXIUM ESOMEPRAZOLE, STATINS, AZO PHENAZOPYRIDINE, CODEINE, MORPHINE AND RELATED, PENICILLINS, SULFA ANTIBIOTICS            Current Medications (12/10/2017):  This is the current hospital active medication list Current Facility-Administered Medications  Medication Dose Route Frequency Provider Last Rate Last Dose  . 0.9 %  sodium chloride infusion   Intravenous Continuous Raiford Noble [place], DO 50 mL/hr at 12/09/17 1945    . acetaminophen (TYLENOL) tablet 650 mg  650 mg Oral Q6H PRN Fuller Plan A, MD   650 mg at 12/07/17 1328   Or  . acetaminophen (TYLENOL) suppository 650 mg  650 mg Rectal Q6H PRN Smith, Rondell A, MD      . albuterol (PROVENTIL) (2.5 MG/3ML) 0.083% nebulizer solution  2.5 mg  2.5 mg Nebulization Q6H PRN Fuller Plan A, MD      . amiodarone (PACERONE) tablet 200 mg  200 mg Oral Daily Charlynne Cousins, MD   200 mg at 12/10/17 0841  . amLODipine (NORVASC) tablet 5 mg  5 mg Oral Daily Fuller Plan A, MD   5 mg at 12/10/17 0841  . aspirin EC tablet 81 mg  81 mg Oral Daily Fuller Plan A, MD   81 mg at 12/10/17 0842  . atenolol (TENORMIN) tablet 25 mg  25 mg Oral Daily Tamala Julian, Rondell A, MD   25 mg at 12/10/17 0841  . feeding  supplement (ENSURE ENLIVE) (ENSURE ENLIVE) liquid 237 mL  237 mL Oral BID BM SheikhGeorgina Quint [place], DO   237 mL at 12/10/17 1352  . heparin injection 5,000 Units  5,000 Units Subcutaneous Q8H Fuller Plan A, MD   5,000 Units at 12/10/17 1352  . HYDROcodone-acetaminophen (NORCO/VICODIN) 5-325 MG per tablet 1 tablet  1 tablet Oral Q6H PRN Charlynne Cousins, MD   1 tablet at 12/09/17 1432  . isosorbide mononitrate (IMDUR) 24 hr tablet 30 mg  30 mg Oral Daily Tamala Julian, Rondell A, MD   30 mg at 12/10/17 0841  . levothyroxine (SYNTHROID, LEVOTHROID) tablet 100 mcg  100 mcg Oral QAC breakfast Fuller Plan A, MD   100 mcg at 12/10/17 0841  . polyvinyl alcohol (LIQUIFILM TEARS) 1.4 % ophthalmic solution 2 drop  2 drop Both Eyes QHS Smith, Rondell A, MD   2 drop at 12/09/17 2135  . zolpidem (AMBIEN) tablet 5 mg  5 mg Oral QHS PRN Charlynne Cousins, MD   5 mg at 12/09/17 2135     Discharge Medications: Please see discharge summary for a list of discharge medications.  Relevant Imaging Results:  Relevant Lab Results:   Additional Information SS# [id number]  Nila Nephew, LCSW

## 2017-12-11 DIAGNOSIS — I480 Paroxysmal atrial fibrillation: Secondary | ICD-10-CM | POA: Diagnosis not present

## 2017-12-11 DIAGNOSIS — I10 Essential (primary) hypertension: Secondary | ICD-10-CM | POA: Diagnosis not present

## 2017-12-11 DIAGNOSIS — R531 Weakness: Secondary | ICD-10-CM | POA: Diagnosis not present

## 2017-12-11 DIAGNOSIS — I2510 Atherosclerotic heart disease of native coronary artery without angina pectoris: Secondary | ICD-10-CM | POA: Diagnosis not present

## 2017-12-11 LAB — HEPATITIS PANEL, ACUTE
HCV Ab: <0.1 s/co ratio (ref 0.0–0.9)
HEP A IGM: NEGATIVE
HEP B C IGM: NEGATIVE
Hepatitis B Surface Ag: NEGATIVE

## 2017-12-25 DIAGNOSIS — R5381 Other malaise: Secondary | ICD-10-CM | POA: Diagnosis not present

## 2017-12-25 DIAGNOSIS — R262 Difficulty in walking, not elsewhere classified: Secondary | ICD-10-CM | POA: Diagnosis not present

## 2017-12-25 DIAGNOSIS — M6281 Muscle weakness (generalized): Secondary | ICD-10-CM | POA: Diagnosis not present

## 2017-12-31 DIAGNOSIS — J189 Pneumonia, unspecified organism: Secondary | ICD-10-CM | POA: Diagnosis not present

## 2017-12-31 DIAGNOSIS — R296 Repeated falls: Secondary | ICD-10-CM | POA: Diagnosis not present

## 2017-12-31 DIAGNOSIS — R05 Cough: Secondary | ICD-10-CM | POA: Diagnosis not present

## 2017-12-31 DIAGNOSIS — G4700 Insomnia, unspecified: Secondary | ICD-10-CM | POA: Diagnosis not present

## 2018-01-03 DIAGNOSIS — R001 Bradycardia, unspecified: Secondary | ICD-10-CM | POA: Diagnosis not present

## 2018-01-03 DIAGNOSIS — R682 Dry mouth, unspecified: Secondary | ICD-10-CM | POA: Diagnosis not present

## 2018-01-03 DIAGNOSIS — M6281 Muscle weakness (generalized): Secondary | ICD-10-CM | POA: Diagnosis not present

## 2018-01-03 DIAGNOSIS — R42 Dizziness and giddiness: Secondary | ICD-10-CM | POA: Diagnosis not present

## 2018-01-10 ENCOUNTER — Inpatient Hospital Stay (HOSPITAL_COMMUNITY)
Admission: EM | Admit: 2018-01-10 | Discharge: 2018-02-06 | DRG: 308 | Disposition: E | Payer: Medicare Other | Source: Skilled Nursing Facility | Attending: Internal Medicine | Admitting: Internal Medicine

## 2018-01-10 ENCOUNTER — Emergency Department (HOSPITAL_COMMUNITY): Payer: Medicare Other

## 2018-01-10 ENCOUNTER — Encounter (HOSPITAL_COMMUNITY): Payer: Self-pay

## 2018-01-10 ENCOUNTER — Other Ambulatory Visit: Payer: Self-pay

## 2018-01-10 ENCOUNTER — Telehealth: Payer: Self-pay | Admitting: Cardiology

## 2018-01-10 DIAGNOSIS — E860 Dehydration: Secondary | ICD-10-CM | POA: Diagnosis present

## 2018-01-10 DIAGNOSIS — IMO0002 Reserved for concepts with insufficient information to code with codable children: Secondary | ICD-10-CM

## 2018-01-10 DIAGNOSIS — D649 Anemia, unspecified: Secondary | ICD-10-CM | POA: Diagnosis present

## 2018-01-10 DIAGNOSIS — Z880 Allergy status to penicillin: Secondary | ICD-10-CM

## 2018-01-10 DIAGNOSIS — D631 Anemia in chronic kidney disease: Secondary | ICD-10-CM | POA: Diagnosis not present

## 2018-01-10 DIAGNOSIS — K589 Irritable bowel syndrome without diarrhea: Secondary | ICD-10-CM | POA: Diagnosis present

## 2018-01-10 DIAGNOSIS — E43 Unspecified severe protein-calorie malnutrition: Secondary | ICD-10-CM | POA: Diagnosis present

## 2018-01-10 DIAGNOSIS — Z9842 Cataract extraction status, left eye: Secondary | ICD-10-CM

## 2018-01-10 DIAGNOSIS — R627 Adult failure to thrive: Secondary | ICD-10-CM

## 2018-01-10 DIAGNOSIS — R634 Abnormal weight loss: Secondary | ICD-10-CM | POA: Diagnosis not present

## 2018-01-10 DIAGNOSIS — Z6822 Body mass index (BMI) 22.0-22.9, adult: Secondary | ICD-10-CM | POA: Diagnosis not present

## 2018-01-10 DIAGNOSIS — Z9181 History of falling: Secondary | ICD-10-CM

## 2018-01-10 DIAGNOSIS — M4856XD Collapsed vertebra, not elsewhere classified, lumbar region, subsequent encounter for fracture with routine healing: Secondary | ICD-10-CM | POA: Diagnosis not present

## 2018-01-10 DIAGNOSIS — N183 Chronic kidney disease, stage 3 unspecified: Secondary | ICD-10-CM | POA: Diagnosis present

## 2018-01-10 DIAGNOSIS — E039 Hypothyroidism, unspecified: Secondary | ICD-10-CM | POA: Diagnosis present

## 2018-01-10 DIAGNOSIS — R42 Dizziness and giddiness: Secondary | ICD-10-CM | POA: Diagnosis not present

## 2018-01-10 DIAGNOSIS — N179 Acute kidney failure, unspecified: Secondary | ICD-10-CM

## 2018-01-10 DIAGNOSIS — E861 Hypovolemia: Secondary | ICD-10-CM | POA: Diagnosis present

## 2018-01-10 DIAGNOSIS — Z79899 Other long term (current) drug therapy: Secondary | ICD-10-CM | POA: Diagnosis not present

## 2018-01-10 DIAGNOSIS — I5043 Acute on chronic combined systolic (congestive) and diastolic (congestive) heart failure: Secondary | ICD-10-CM | POA: Diagnosis present

## 2018-01-10 DIAGNOSIS — E871 Hypo-osmolality and hyponatremia: Secondary | ICD-10-CM

## 2018-01-10 DIAGNOSIS — I272 Pulmonary hypertension, unspecified: Secondary | ICD-10-CM | POA: Diagnosis present

## 2018-01-10 DIAGNOSIS — Z955 Presence of coronary angioplasty implant and graft: Secondary | ICD-10-CM

## 2018-01-10 DIAGNOSIS — Z7982 Long term (current) use of aspirin: Secondary | ICD-10-CM | POA: Diagnosis not present

## 2018-01-10 DIAGNOSIS — E875 Hyperkalemia: Secondary | ICD-10-CM | POA: Diagnosis not present

## 2018-01-10 DIAGNOSIS — Z882 Allergy status to sulfonamides status: Secondary | ICD-10-CM

## 2018-01-10 DIAGNOSIS — I499 Cardiac arrhythmia, unspecified: Secondary | ICD-10-CM | POA: Diagnosis not present

## 2018-01-10 DIAGNOSIS — M7989 Other specified soft tissue disorders: Secondary | ICD-10-CM | POA: Diagnosis not present

## 2018-01-10 DIAGNOSIS — J704 Drug-induced interstitial lung disorders, unspecified: Secondary | ICD-10-CM | POA: Diagnosis present

## 2018-01-10 DIAGNOSIS — I129 Hypertensive chronic kidney disease with stage 1 through stage 4 chronic kidney disease, or unspecified chronic kidney disease: Secondary | ICD-10-CM | POA: Diagnosis not present

## 2018-01-10 DIAGNOSIS — Z90710 Acquired absence of both cervix and uterus: Secondary | ICD-10-CM | POA: Diagnosis not present

## 2018-01-10 DIAGNOSIS — I10 Essential (primary) hypertension: Secondary | ICD-10-CM | POA: Diagnosis not present

## 2018-01-10 DIAGNOSIS — Z79890 Hormone replacement therapy (postmenopausal): Secondary | ICD-10-CM

## 2018-01-10 DIAGNOSIS — Z9109 Other allergy status, other than to drugs and biological substances: Secondary | ICD-10-CM

## 2018-01-10 DIAGNOSIS — F419 Anxiety disorder, unspecified: Secondary | ICD-10-CM | POA: Diagnosis present

## 2018-01-10 DIAGNOSIS — J984 Other disorders of lung: Secondary | ICD-10-CM | POA: Diagnosis not present

## 2018-01-10 DIAGNOSIS — Z515 Encounter for palliative care: Secondary | ICD-10-CM | POA: Diagnosis present

## 2018-01-10 DIAGNOSIS — E222 Syndrome of inappropriate secretion of antidiuretic hormone: Secondary | ICD-10-CM | POA: Diagnosis not present

## 2018-01-10 DIAGNOSIS — I480 Paroxysmal atrial fibrillation: Secondary | ICD-10-CM | POA: Diagnosis present

## 2018-01-10 DIAGNOSIS — R451 Restlessness and agitation: Secondary | ICD-10-CM | POA: Diagnosis not present

## 2018-01-10 DIAGNOSIS — R0602 Shortness of breath: Secondary | ICD-10-CM | POA: Diagnosis not present

## 2018-01-10 DIAGNOSIS — J90 Pleural effusion, not elsewhere classified: Secondary | ICD-10-CM | POA: Diagnosis not present

## 2018-01-10 DIAGNOSIS — R001 Bradycardia, unspecified: Secondary | ICD-10-CM | POA: Diagnosis not present

## 2018-01-10 DIAGNOSIS — I82622 Acute embolism and thrombosis of deep veins of left upper extremity: Secondary | ICD-10-CM | POA: Diagnosis not present

## 2018-01-10 DIAGNOSIS — R918 Other nonspecific abnormal finding of lung field: Secondary | ICD-10-CM

## 2018-01-10 DIAGNOSIS — Z66 Do not resuscitate: Secondary | ICD-10-CM | POA: Diagnosis present

## 2018-01-10 DIAGNOSIS — M4854XD Collapsed vertebra, not elsewhere classified, thoracic region, subsequent encounter for fracture with routine healing: Secondary | ICD-10-CM | POA: Diagnosis not present

## 2018-01-10 DIAGNOSIS — Z7189 Other specified counseling: Secondary | ICD-10-CM

## 2018-01-10 DIAGNOSIS — I82621 Acute embolism and thrombosis of deep veins of right upper extremity: Secondary | ICD-10-CM | POA: Diagnosis not present

## 2018-01-10 DIAGNOSIS — R531 Weakness: Secondary | ICD-10-CM | POA: Diagnosis not present

## 2018-01-10 DIAGNOSIS — T462X5A Adverse effect of other antidysrhythmic drugs, initial encounter: Secondary | ICD-10-CM | POA: Diagnosis present

## 2018-01-10 DIAGNOSIS — L8990 Pressure ulcer of unspecified site, unspecified stage: Secondary | ICD-10-CM

## 2018-01-10 DIAGNOSIS — I130 Hypertensive heart and chronic kidney disease with heart failure and stage 1 through stage 4 chronic kidney disease, or unspecified chronic kidney disease: Secondary | ICD-10-CM | POA: Diagnosis present

## 2018-01-10 DIAGNOSIS — Z885 Allergy status to narcotic agent status: Secondary | ICD-10-CM

## 2018-01-10 DIAGNOSIS — Z9861 Coronary angioplasty status: Secondary | ICD-10-CM

## 2018-01-10 DIAGNOSIS — Z961 Presence of intraocular lens: Secondary | ICD-10-CM | POA: Diagnosis present

## 2018-01-10 DIAGNOSIS — E785 Hyperlipidemia, unspecified: Secondary | ICD-10-CM | POA: Diagnosis present

## 2018-01-10 DIAGNOSIS — Z9841 Cataract extraction status, right eye: Secondary | ICD-10-CM

## 2018-01-10 DIAGNOSIS — R05 Cough: Secondary | ICD-10-CM | POA: Diagnosis not present

## 2018-01-10 DIAGNOSIS — I82409 Acute embolism and thrombosis of unspecified deep veins of unspecified lower extremity: Secondary | ICD-10-CM

## 2018-01-10 DIAGNOSIS — I2510 Atherosclerotic heart disease of native coronary artery without angina pectoris: Secondary | ICD-10-CM | POA: Diagnosis not present

## 2018-01-10 DIAGNOSIS — E440 Moderate protein-calorie malnutrition: Secondary | ICD-10-CM | POA: Diagnosis not present

## 2018-01-10 DIAGNOSIS — I447 Left bundle-branch block, unspecified: Secondary | ICD-10-CM | POA: Diagnosis present

## 2018-01-10 DIAGNOSIS — K219 Gastro-esophageal reflux disease without esophagitis: Secondary | ICD-10-CM | POA: Diagnosis present

## 2018-01-10 DIAGNOSIS — R059 Cough, unspecified: Secondary | ICD-10-CM

## 2018-01-10 DIAGNOSIS — M6281 Muscle weakness (generalized): Secondary | ICD-10-CM | POA: Diagnosis not present

## 2018-01-10 HISTORY — DX: Personal history of urinary calculi: Z87.442

## 2018-01-10 HISTORY — DX: Migraine, unspecified, not intractable, without status migrainosus: G43.909

## 2018-01-10 HISTORY — DX: Unspecified osteoarthritis, unspecified site: M19.90

## 2018-01-10 HISTORY — DX: Inflammatory liver disease, unspecified: K75.9

## 2018-01-10 HISTORY — DX: Reserved for concepts with insufficient information to code with codable children: IMO0002

## 2018-01-10 HISTORY — DX: Anxiety disorder, unspecified: F41.9

## 2018-01-10 HISTORY — DX: Pneumonia, unspecified organism: J18.9

## 2018-01-10 HISTORY — DX: Basal cell carcinoma of skin, unspecified: C44.91

## 2018-01-10 LAB — I-STAT CHEM 8, ED
BUN: 42 mg/dL — ABNORMAL HIGH (ref 6–20)
Calcium, Ion: 1.11 mmol/L — ABNORMAL LOW (ref 1.15–1.40)
Chloride: 94 mmol/L — ABNORMAL LOW (ref 101–111)
Creatinine, Ser: 2.4 mg/dL — ABNORMAL HIGH (ref 0.44–1.00)
Glucose, Bld: 128 mg/dL — ABNORMAL HIGH (ref 65–99)
HEMATOCRIT: 37 % (ref 36.0–46.0)
Hemoglobin: 12.6 g/dL (ref 12.0–15.0)
POTASSIUM: 6 mmol/L — AB (ref 3.5–5.1)
SODIUM: 120 mmol/L — AB (ref 135–145)
TCO2: 18 mmol/L — AB (ref 22–32)

## 2018-01-10 LAB — CBC WITH DIFFERENTIAL/PLATELET
BASOS ABS: 0 10*3/uL (ref 0.0–0.1)
BASOS PCT: 0 %
EOS ABS: 0 10*3/uL (ref 0.0–0.7)
Eosinophils Relative: 0 %
HCT: 31.1 % — ABNORMAL LOW (ref 36.0–46.0)
HEMOGLOBIN: 11 g/dL — AB (ref 12.0–15.0)
Lymphocytes Relative: 18 %
Lymphs Abs: 2 10*3/uL (ref 0.7–4.0)
MCH: 29.7 pg (ref 26.0–34.0)
MCHC: 35.4 g/dL (ref 30.0–36.0)
MCV: 84.1 fL (ref 78.0–100.0)
MONO ABS: 0.7 10*3/uL (ref 0.1–1.0)
MONOS PCT: 7 %
NEUTROS PCT: 75 %
Neutro Abs: 8.2 10*3/uL — ABNORMAL HIGH (ref 1.7–7.7)
Platelets: 269 10*3/uL (ref 150–400)
RBC: 3.7 MIL/uL — ABNORMAL LOW (ref 3.87–5.11)
RDW: 16.1 % — AB (ref 11.5–15.5)
WBC: 11 10*3/uL — ABNORMAL HIGH (ref 4.0–10.5)

## 2018-01-10 LAB — BASIC METABOLIC PANEL
Anion gap: 10 (ref 5–15)
BUN: 42 mg/dL — ABNORMAL HIGH (ref 6–20)
CALCIUM: 8.3 mg/dL — AB (ref 8.9–10.3)
CO2: 16 mmol/L — AB (ref 22–32)
CREATININE: 2.48 mg/dL — AB (ref 0.44–1.00)
Chloride: 94 mmol/L — ABNORMAL LOW (ref 101–111)
GFR, EST AFRICAN AMERICAN: 19 mL/min — AB (ref >60)
GFR, EST NON AFRICAN AMERICAN: 16 mL/min — AB (ref >60)
Glucose, Bld: 130 mg/dL — ABNORMAL HIGH (ref 65–99)
Potassium: 6.1 mmol/L — ABNORMAL HIGH (ref 3.5–5.1)
Sodium: 120 mmol/L — ABNORMAL LOW (ref 135–145)

## 2018-01-10 MED ORDER — PANTOPRAZOLE SODIUM 40 MG PO TBEC
40.0000 mg | DELAYED_RELEASE_TABLET | Freq: Every day | ORAL | Status: DC
  Administered 2018-01-11 – 2018-01-19 (×9): 40 mg via ORAL
  Filled 2018-01-10 (×9): qty 1

## 2018-01-10 MED ORDER — HEPARIN SODIUM (PORCINE) 5000 UNIT/ML IJ SOLN
5000.0000 [IU] | Freq: Three times a day (TID) | INTRAMUSCULAR | Status: DC
  Administered 2018-01-11 – 2018-01-15 (×12): 5000 [IU] via SUBCUTANEOUS
  Filled 2018-01-10 (×12): qty 1

## 2018-01-10 MED ORDER — ACETAMINOPHEN 650 MG RE SUPP: 650.0000 mg | Freq: Four times a day (QID) | RECTAL | Status: DC | PRN

## 2018-01-10 MED ORDER — ZOLPIDEM TARTRATE 5 MG PO TABS
5.0000 mg | ORAL_TABLET | Freq: Every evening | ORAL | Status: DC | PRN
  Administered 2018-01-19: 5 mg via ORAL
  Filled 2018-01-10: qty 1

## 2018-01-10 MED ORDER — ONDANSETRON HCL 4 MG/2ML IJ SOLN
4.0000 mg | Freq: Four times a day (QID) | INTRAMUSCULAR | Status: DC | PRN
  Filled 2018-01-10: qty 2

## 2018-01-10 MED ORDER — DEXTROSE 50 % IV SOLN
1.0000 | Freq: Once | INTRAVENOUS | Status: AC
  Administered 2018-01-10: 50 mL via INTRAVENOUS
  Filled 2018-01-10: qty 50

## 2018-01-10 MED ORDER — CALCIUM GLUCONATE 10 % IV SOLN
1.0000 g | Freq: Once | INTRAVENOUS | Status: AC
  Administered 2018-01-10: 1 g via INTRAVENOUS
  Filled 2018-01-10 (×2): qty 10

## 2018-01-10 MED ORDER — SENNOSIDES-DOCUSATE SODIUM 8.6-50 MG PO TABS: 1.0000 | ORAL_TABLET | Freq: Every evening | ORAL | Status: DC | PRN

## 2018-01-10 MED ORDER — ISOSORBIDE MONONITRATE ER 30 MG PO TB24
30.0000 mg | ORAL_TABLET | Freq: Every day | ORAL | Status: DC
  Administered 2018-01-11 – 2018-01-19 (×9): 30 mg via ORAL
  Filled 2018-01-10 (×10): qty 1

## 2018-01-10 MED ORDER — INSULIN ASPART 100 UNIT/ML IV SOLN
10.0000 [IU] | Freq: Once | INTRAVENOUS | Status: DC
  Filled 2018-01-10: qty 0.1

## 2018-01-10 MED ORDER — SODIUM CHLORIDE 0.9 % IV SOLN
INTRAVENOUS | Status: AC
  Administered 2018-01-10: 20:00:00 via INTRAVENOUS

## 2018-01-10 MED ORDER — INSULIN ASPART 100 UNIT/ML ~~LOC~~ SOLN
10.0000 [IU] | Freq: Once | SUBCUTANEOUS | Status: AC
  Administered 2018-01-10: 10 [IU] via SUBCUTANEOUS
  Filled 2018-01-10: qty 1

## 2018-01-10 MED ORDER — ALBUTEROL SULFATE (2.5 MG/3ML) 0.083% IN NEBU
10.0000 mg | INHALATION_SOLUTION | Freq: Once | RESPIRATORY_TRACT | Status: AC
  Administered 2018-01-10: 10 mg via RESPIRATORY_TRACT
  Filled 2018-01-10: qty 12

## 2018-01-10 MED ORDER — SODIUM CHLORIDE 0.9% FLUSH
3.0000 mL | Freq: Two times a day (BID) | INTRAVENOUS | Status: DC
  Administered 2018-01-11 – 2018-01-20 (×17): 3 mL via INTRAVENOUS

## 2018-01-10 MED ORDER — POLYVINYL ALCOHOL 1.4 % OP SOLN
2.0000 [drp] | Freq: Every day | OPHTHALMIC | Status: DC
  Administered 2018-01-11 – 2018-01-19 (×5): 2 [drp] via OPHTHALMIC
  Filled 2018-01-10: qty 15

## 2018-01-10 MED ORDER — LEVOTHYROXINE SODIUM 100 MCG PO TABS
100.0000 ug | ORAL_TABLET | Freq: Every day | ORAL | Status: DC
  Administered 2018-01-11 – 2018-01-18 (×8): 100 ug via ORAL
  Filled 2018-01-10 (×8): qty 1

## 2018-01-10 MED ORDER — DARIFENACIN HYDROBROMIDE ER 7.5 MG PO TB24
7.5000 mg | ORAL_TABLET | Freq: Every day | ORAL | Status: DC
  Administered 2018-01-11 – 2018-01-18 (×7): 7.5 mg via ORAL
  Filled 2018-01-10 (×11): qty 1

## 2018-01-10 MED ORDER — ONDANSETRON HCL 4 MG PO TABS
4.0000 mg | ORAL_TABLET | Freq: Four times a day (QID) | ORAL | Status: DC | PRN
  Administered 2018-01-16: 4 mg via ORAL
  Filled 2018-01-10: qty 1

## 2018-01-10 MED ORDER — ACETAMINOPHEN 325 MG PO TABS
650.0000 mg | ORAL_TABLET | Freq: Four times a day (QID) | ORAL | Status: DC | PRN
  Administered 2018-01-13 – 2018-01-19 (×3): 650 mg via ORAL
  Filled 2018-01-10 (×3): qty 2

## 2018-01-10 MED ORDER — AMLODIPINE BESYLATE 5 MG PO TABS
5.0000 mg | ORAL_TABLET | Freq: Every day | ORAL | Status: DC
  Administered 2018-01-11 – 2018-01-16 (×6): 5 mg via ORAL
  Filled 2018-01-10 (×6): qty 1

## 2018-01-10 MED ORDER — ASPIRIN EC 81 MG PO TBEC
81.0000 mg | DELAYED_RELEASE_TABLET | Freq: Every day | ORAL | Status: DC
  Administered 2018-01-11 – 2018-01-18 (×8): 81 mg via ORAL
  Filled 2018-01-10 (×8): qty 1

## 2018-01-10 MED ORDER — SODIUM BICARBONATE 8.4 % IV SOLN
50.0000 meq | Freq: Once | INTRAVENOUS | Status: AC
  Administered 2018-01-10: 50 meq via INTRAVENOUS
  Filled 2018-01-10: qty 50

## 2018-01-10 MED ORDER — ENSURE ENLIVE PO LIQD: 237.0000 mL | Freq: Two times a day (BID) | ORAL | Status: DC

## 2018-01-10 MED ORDER — SODIUM CHLORIDE 0.9 % IV BOLUS
500.0000 mL | Freq: Once | INTRAVENOUS | Status: AC
  Administered 2018-01-10: 500 mL via INTRAVENOUS

## 2018-01-10 MED ORDER — SODIUM POLYSTYRENE SULFONATE 15 GM/60ML PO SUSP
30.0000 g | Freq: Once | ORAL | Status: AC
  Administered 2018-01-10: 30 g via ORAL
  Filled 2018-01-10: qty 120

## 2018-01-10 NOTE — Telephone Encounter (Signed)
STAT if HR is under 50 or over 120 (normal HR is 60-100 beats per minute)  What is your heart rate?  31 1) Do you have a log of your heart rate readings (document readings)?  Unknown   2) Do you have any other symptoms? Unknown   Jeanne Hart said that the facility called to inform him at [facility] and that  They are going to take her to one of the hospitals in [place]

## 2018-01-10 NOTE — ED Notes (Signed)
Message sent to pharmacy requesting insulin and calcium gluconate be verified.

## 2018-01-10 NOTE — ED Triage Notes (Signed)
Per [facility]: Pt from [place] - new in the last 2 days: Noticed HR was low, just feels weak. No loss of consciousness, no shortness of breath, no chest pain.Pt was recovering from pneumonia. Pts recent X-ray showed pneumonia was resolved. Pt alert and oriented X 4. Pt states that she hasn't been eating well at the nursing facility because she doesn't like the food, has just been eating cereal.

## 2018-01-10 NOTE — ED Notes (Signed)
PT's daughter at bed side. 

## 2018-01-10 NOTE — ED Notes (Signed)
X-ray at bedside

## 2018-01-10 NOTE — Consult Note (Addendum)
Cardiology Consultation:   Patient ID: Jeanne Hart; [id number]; May 31, 1927   Admit date: 02/05/2018 Date of Consult: 01/09/2018  Primary Care Provider: [facility] Primary Cardiologist: Dr Ellyn Hack  History of Present Illness:   84 year old female with past medical history of paroxysmal atrial fibrillation, coronary artery disease, hypertension, hyperlipidemia, irritable bowel syndrome for evaluation of bradycardia at request of Mitzi Hansen MD.  Patient has a history of coronary artery disease with prior PCI.  Last nuclear study April 2017 showed ejection fraction 63% and no ischemia or infarction.  Last echocardiogram March 2019 showed normal LV systolic function, mild mitral regurgitation, severe left atrial enlargement and mild right atrial enlargement.  Moderate pulmonary hypertension.  Patient discharged March 4 after admission for falls and dehydration; also with weight loss.  She apparently improved with hydration.  Patient states she typically ambulates with the help of a walker.  She has some dyspnea on exertion but no chest pain.  She denies recent palpitations or syncope.  Over the past 4 days she notes increased weakness.  She denies chest pain, dyspnea, palpitations or syncope.  She presented to the emergency room and was found to have a heart rate in the 30s.  Cardiology asked to evaluate.  Past Medical History:  Diagnosis Date  . CAD S/P percutaneous coronary angioplasty    PCI x 2; once at [facility] and once in [place];; Myoview April 2017: LOW RISK. EF 63%. No ischemia or infarction.  . Diverticulitis   . Dyslipidemia   . GERD (gastroesophageal reflux disease)   . Hiatal hernia   . HTN (hypertension)   . Hypothyroidism   . IBS (irritable bowel syndrome)   . Insomnia    chronic  . PAF (paroxysmal atrial fibrillation) (HCC)    CHA2DS2VASC = 4.  coumadin was stopped secondary to GI Bleed, on amiodarone; monitor march 2014-NSR    Past Surgical History:   Procedure Laterality Date  . ABDOMINAL HYSTERECTOMY    . APPENDECTOMY    . CHOLECYSTECTOMY    . CORONARY ANGIOPLASTY WITH STENT PLACEMENT  01/05/2011   2.5x4mm Promus DES stent to RCA post dilated to 2.75x57mm   . CORONARY ANGIOPLASTY WITH STENT PLACEMENT  ?   3 stents per patient  . NM MYOVIEW LTD  April 2017   LOW RISK. EF 60%. No ischemia or infarction.     Inpatient Medications: Scheduled Meds:  Continuous Infusions:  PRN Meds:   Allergies:    Allergies  Allergen Reactions  . Nexium [Esomeprazole] Other (See Comments)    Aggravates IBS.   . Statins Other (See Comments)    Had jaundice as child.  Does tolerate current statin therapy.    Abel Presto [Phenazopyridine] Other (See Comments)    On MAR  . Codeine Other (See Comments)    Post-surgical reaction of unknown type.  . Morphine And Related Other (See Comments)    Unknown.    Marland Kitchen Penicillins Other (See Comments)    Unknown. Has patient had a PCN reaction causing immediate rash, facial/tongue/throat swelling, SOB or lightheadedness with hypotension: YES Has patient had a PCN reaction causing severe rash involving mucus membranes or skin necrosis:NO Has patient had a PCN reaction that required hospitalization NO Has patient had a PCN reaction occurring within the last 10 years: NO If all of the above answers are "NO", then may proceed with Cephalosporin use.     . Sulfa Antibiotics Other (See Comments)    Unknown childhood reaction.  Social History:   Social History   Socioeconomic History  . Marital status: Widowed    Spouse name: Not on file  . Number of children: 3  . Years of education: Not on file  . Highest education level: Not on file  Occupational History  . Occupation: Sales/Office-retired  Social Needs  . Financial resource strain: Not on file  . Food insecurity:    Worry: Not on file    Inability: Not on file  . Transportation needs:    Medical: Not on file    Non-medical: Not on file    Tobacco Use  . Smoking status: Never Smoker  . Smokeless tobacco: Never Used  Substance and Sexual Activity  . Alcohol use: No  . Drug use: No  . Sexual activity: Not on file  Lifestyle  . Physical activity:    Days per week: Not on file    Minutes per session: Not on file  . Stress: Not on file  Relationships  . Social connections:    Talks on phone: Not on file    Gets together: Not on file    Attends religious service: Not on file    Active member of club or organization: Not on file    Attends meetings of clubs or organizations: Not on file    Relationship status: Not on file  . Intimate partner violence:    Fear of current or ex partner: Not on file    Emotionally abused: Not on file    Physically abused: Not on file    Forced sexual activity: Not on file  Other Topics Concern  . Not on file  Social History Narrative  . Not on file    Family History:    Family History  Problem Relation Age of Onset  . Stroke Mother   . Diabetes Maternal Aunt   . Diabetes Son   . Heart disease Unknown        Both sides of family  . Colon cancer Neg Hx      ROS:  Please see the history of present illness.  Patient denies syncope, fevers, chills, productive cough, hemoptysis, melena.  She does complain of weakness. All other ROS reviewed and negative.     Physical Exam/Data:   Vitals:   01/22/2018 1627 02/04/2018 1634 01/14/2018 1645 01/13/2018 1700  BP:  (!) 124/54 (!) 126/59 (!) 131/59  Pulse:  (!) 30 (!) 30 (!) 37  Resp:  16 19 (!) 21  Temp:  97.6 F (36.4 C)    TempSrc:  Oral    SpO2: 100% 96% 93% 96%   No intake or output data in the 24 hours ending 01/30/2018 1731 There were no vitals filed for this visit. There is no height or weight on file to calculate BMI.  General:  Frail, well developed in no acute distress HEENT: normal Neck: no JVD Vascular: No carotid bruits; FA pulses 2+ bilaterally without bruits  Cardiac:  Bradycardic, regular Lungs:  clear to auscultation  bilaterally, no wheezing, rhonchi or rales  Abd: soft, nontender, no hepatomegaly  Ext: no edema Musculoskeletal:  No deformities, BUE and BLE strength normal and equal Skin: warm and dry  Neuro:  CNs 2-12 intact, no focal abnormalities noted Psych:  Normal affect   EKG:  The EKG was personally reviewed and demonstrates: Junctional bradycardia with a heart rate of 31.  Laboratory Data:  Chemistry Recent Labs  Lab 01/22/2018 1710  NA 120*  K 6.0*  CL 94*  GLUCOSE 128*  BUN 42*  CREATININE 2.40*    Hematology Recent Labs  Lab 01/28/2018 1710  HGB 12.6  HCT 37.0   Radiology/Studies:  Dg Chest Port 1 View  Result Date: 01/27/2018 CLINICAL DATA:  84 year old female with weakness, bradycardia. EXAM: PORTABLE CHEST 1 VIEW COMPARISON:  Chest CT 12/09/2017 and earlier. FINDINGS: Portable AP semi upright view at 1648 hours. Pacer pads project over the chest. Stable cardiac size and mediastinal contours. Calcified aortic atherosclerosis. Stable lung volumes. No pneumothorax, pulmonary edema, pleural effusion or acute pulmonary opacity. Paucity bowel gas in the upper abdomen. IMPRESSION: No acute cardiopulmonary abnormality. Electronically Signed   By: Genevie Ann M.D.   On: 01/21/2018 17:12    Assessment and Plan:   1. Junctional bradycardia with heart rate 31-patient is severely bradycardic which is likely causing her weakness.  I will hold atenolol and amiodarone for now.  She has severe electrolyte abnormalities including potassium 6.0.  This will need to be corrected with Kayexalate.  This could be contributing to her bradycardia as well.  We will follow on telemetry and hopefully heart rate will improve with above measures.  If not she would ultimately require pacemaker.  We will leave transcutaneous pads in place. 2. History of paroxysmal atrial fibrillation-given severe bradycardia we will hold amiodarone and atenolol.  She apparently is not on anticoagulation because of history of bleeding  and unstable gait. CHADSvasc 5.  3. Hyperkalemia-patient is prerenal and potassium is 6.  She appears to be dehydrated.  Hold potassium supplementation.  Hydrate.  Correct potassium with Kayexalate. 4. Coronary artery disease-continue aspirin.  She is intolerant to statins. 5. Acute on chronic stage III kidney disease-patient is dehydrated.  Would gently hydrate and follow renal function.  Note LV function is normal. 6. Hypertension-hold amlodipine, imdur and atenolol.  Follow blood pressure and adjust regimen as needed. 7. Hyponatremia-gentle hydration with normal saline.  Correct sodium slowly.  Management per primary service. 8. Weight loss-further evaluation per primary care.   For questions or updates, please contact [facility] Please consult [email or web address] for contact info under Cardiology/STEMI.   Signed, Kirk Ruths, MD  01/27/2018 5:31 PM

## 2018-01-10 NOTE — Telephone Encounter (Signed)
Spoke with Mr Stann Mainland and he just wanted Dr Ellyn Hack to be aware. Will forward to Dr Ellyn Hack so he will be aware

## 2018-01-10 NOTE — ED Provider Notes (Signed)
[facility] EMERGENCY DEPARTMENT Provider Note   CSN: [id number] Arrival date & time: 01/25/2018  1626     History   Chief Complaint Chief Complaint  Patient presents with  . Bradycardia    HPI Jeanne Hart is a 84 y.o. female.  Patient is a 85 year old female with a history of hypertension, paroxysmal atrial fibrillation not on anticoagulants, coronary artery disease status post stent placement and diverticulosis who presents with weakness.  She was recently admitted about a month ago for pneumonia, dehydration and acute kidney injury.  She was discharged to rehab center.  She recently went to [facility] assisted living facility.  She is been there about 2 days.  Since that time she has been feeling generally weak.  There is no unilateral weakness.  No nausea or vomiting.  No chest pain or shortness of breath.  The staff there noticed that her heart rate was low and sent her here for further evaluation.  Patient states she has not really been eating well and feels like that is 1 of the reason she has been weak.  She was recently treated for pneumonia but a follow-up x-ray reportedly was normal.  She is not having any chest pain, palpitations or shortness of breath.  No dizziness.     Past Medical History:  Diagnosis Date  . CAD S/P percutaneous coronary angioplasty    PCI x 2; once at [facility] and once in [place];; Myoview April 2017: LOW RISK. EF 63%. No ischemia or infarction.  . Diverticulitis   . Dyslipidemia   . GERD (gastroesophageal reflux disease)   . Hiatal hernia   . HTN (hypertension)   . Hypothyroidism   . IBS (irritable bowel syndrome)   . Insomnia    chronic  . PAF (paroxysmal atrial fibrillation) (HCC)    CHA2DS2VASC = 4.  coumadin was stopped secondary to GI Bleed, on amiodarone; monitor march 2014-NSR    Patient Active Problem List   Diagnosis Date Noted  . Symptomatic bradycardia 01/09/2018  . Hyperkalemia 01/18/2018  .  Hyponatremia 01/17/2018  . Malnutrition of moderate degree 12/10/2017  . Prolonged QT interval 12/07/2017  . AKI (acute kidney injury) ([place]) 12/07/2017  . Frequent falls 12/07/2017  . Weakness generalized 10/19/2016  . CKD (chronic kidney disease) stage 3, GFR 30-59 ml/min (HCC) 10/19/2016  . Elevated LFTs 10/19/2016  . Thrombocytopenia ([place]) 10/19/2016  . Nonspecific abnormal finding in stool contents 05/30/2015  . Foot swelling 02/18/2015  . DOE (dyspnea on exertion) 12/10/2014  . CAD S/P percutaneous coronary angioplasty   . Heme positive stool 04/03/2014  . Anemia, unspecified 04/03/2014  . Diarrhea 04/03/2014  . History of unstable angina 03/23/2014  . Hypothyroidism 03/23/2014  . Essential hypertension 04/29/2013  . Hyperlipidemia with target LDL less than 70 04/29/2013  . Paroxysmal atrial fibrillation [facility]): Not on Anticoagulation 2/2 GIB history.  CHA2DS2VASC =4 04/29/2013  . History of GI bleed 04/29/2013  . Palpitations 04/29/2013    Past Surgical History:  Procedure Laterality Date  . ABDOMINAL HYSTERECTOMY    . APPENDECTOMY    . CHOLECYSTECTOMY    . CORONARY ANGIOPLASTY WITH STENT PLACEMENT  01/05/2011   2.5x65mm Promus DES stent to RCA post dilated to 2.75x39mm   . CORONARY ANGIOPLASTY WITH STENT PLACEMENT  ?   3 stents per patient  . NM MYOVIEW LTD  April 2017   LOW RISK. EF 60%. No ischemia or infarction.     OB History   None  Home Medications    Prior to Admission medications   Medication Sig Start Date End Date Taking? Authorizing Provider  amiodarone (PACERONE) 200 MG tablet TAKE 1 TABLET BY MOUTH ONCE DAILY AND 1 TABLET AS NEEDED FOR FAST HEART RATE 06/18/17   Leonie Man, MD  amLODipine (NORVASC) 5 MG tablet Take 1 tablet by mouth daily. take 1 tab dialy 05/07/15   [provider]  Ascorbic Acid (VITAMIN C PO) Take 1 tablet by mouth daily.     [provider]  aspirin EC 81 MG tablet Take 1 tablet (81 mg total) by mouth  daily. 02/15/15   Leonie Man, MD  atenolol (TENORMIN) 25 MG tablet Take 25 mg by mouth daily.    [provider]  Cholecalciferol (VITAMIN D PO) Take 1 tablet by mouth daily.     [provider]  feeding supplement, ENSURE ENLIVE, (ENSURE ENLIVE) LIQD Take 237 mLs by mouth 2 (two) times daily between meals. 12/11/17   Raiford Noble Latif, DO  isosorbide mononitrate (IMDUR) 30 MG 24 hr tablet Take 1 tablet (30 mg total) by mouth daily. 09/09/14   Margarita Mail, PA-C  levothyroxine (SYNTHROID, LEVOTHROID) 100 MCG tablet Take 100 mcg by mouth daily before breakfast.    [provider]  nitroGLYCERIN (NITROSTAT) 0.4 MG SL tablet Place 1 tablet (0.4 mg total) under the tongue every 5 (five) minutes as needed for chest pain. MAX 3 doses 02/05/17   Leonie Man, MD  pantoprazole (PROTONIX) 40 MG tablet Take 40 mg by mouth daily.     [provider]  Polyethyl Glycol-Propyl Glycol (SYSTANE) 0.4-0.3 % SOLN Apply 2 drops to eye at bedtime.    [provider]  potassium chloride (K-DUR) 10 MEQ tablet Take 1 tablet (10 mEq total) by mouth daily. 03/05/16   Maryan Puls, MD  Solifenacin Succinate (VESICARE PO) Take 1 tablet by mouth daily.    [provider]  zolpidem (AMBIEN CR) 12.5 MG CR tablet Take 12.5 mg by mouth every other day.    [provider]    Family History Family History  Problem Relation Age of Onset  . Stroke Mother   . Diabetes Maternal Aunt   . Diabetes Son   . Heart disease Unknown        Both sides of family  . Colon cancer Neg Hx     Social History Social History   Tobacco Use  . Smoking status: Never Smoker  . Smokeless tobacco: Never Used  Substance Use Topics  . Alcohol use: No  . Drug use: No     Allergies   Nexium [esomeprazole]; Statins; Azo [phenazopyridine]; Codeine; Morphine and related; Penicillins; and Sulfa antibiotics   Review of Systems Review of Systems  Constitutional: Positive  for fatigue. Negative for chills, diaphoresis and fever.  HENT: Negative for congestion, rhinorrhea and sneezing.   Eyes: Negative.   Respiratory: Negative for cough, chest tightness and shortness of breath.   Cardiovascular: Negative for chest pain and leg swelling.  Gastrointestinal: Negative for abdominal pain, blood in stool, diarrhea, nausea and vomiting.  Genitourinary: Negative for difficulty urinating, flank pain, frequency and hematuria.  Musculoskeletal: Negative for arthralgias and back pain.  Skin: Negative for rash.  Neurological: Positive for weakness. Negative for dizziness, speech difficulty, numbness and headaches.     Physical Exam Updated Vital Signs BP (!) 144/46   Pulse (!) 38   Temp 97.6 F (36.4 C) (Oral)   Resp 13  SpO2 100%   Physical Exam  Constitutional: She is oriented to person, place, and time. She appears well-developed and well-nourished.  HENT:  Head: Normocephalic and atraumatic.  Eyes: Pupils are equal, round, and reactive to light.  Neck: Normal range of motion. Neck supple.  Cardiovascular: Regular rhythm and normal heart sounds. Bradycardia present.  Pulmonary/Chest: Effort normal and breath sounds normal. No respiratory distress. She has no wheezes. She has no rales. She exhibits no tenderness.  Abdominal: Soft. Bowel sounds are normal. There is no tenderness. There is no rebound and no guarding.  Musculoskeletal: Normal range of motion. She exhibits no edema.  Lymphadenopathy:    She has no cervical adenopathy.  Neurological: She is alert and oriented to person, place, and time.  Skin: Skin is warm and dry. No rash noted.  Psychiatric: She has a normal mood and affect.     ED Treatments / Results  Labs (all labs ordered are listed, but only abnormal results are displayed) Labs Reviewed  BASIC METABOLIC PANEL - Abnormal; Notable for the following components:      Result Value   Sodium 120 (*)    Potassium 6.1 (*)    Chloride 94  (*)    CO2 16 (*)    Glucose, Bld 130 (*)    BUN 42 (*)    Creatinine, Ser 2.48 (*)    Calcium 8.3 (*)    GFR calc non Af Amer 16 (*)    GFR calc Af Amer 19 (*)    All other components within normal limits  CBC WITH DIFFERENTIAL/PLATELET - Abnormal; Notable for the following components:   WBC 11.0 (*)    RBC 3.70 (*)    Hemoglobin 11.0 (*)    HCT 31.1 (*)    RDW 16.1 (*)    Neutro Abs 8.2 (*)    All other components within normal limits  I-STAT CHEM 8, ED - Abnormal; Notable for the following components:   Sodium 120 (*)    Potassium 6.0 (*)    Chloride 94 (*)    BUN 42 (*)    Creatinine, Ser 2.40 (*)    Glucose, Bld 128 (*)    Calcium, Ion 1.11 (*)    TCO2 18 (*)    All other components within normal limits  URINALYSIS, ROUTINE W REFLEX MICROSCOPIC    EKG EKG Interpretation  Date/Time:  Thursday January 10 2018 16:31:46 EDT Ventricular Rate:  31 PR Interval:    QRS Duration: 127 QT Interval:  628 QTC Calculation: 451 R Axis:   62 Text Interpretation:  Junctional rhythm Left bundle branch block Confirmed by Malvin Johns ([id number]) on 02/03/2018 4:41:13 PM   Radiology Dg Chest Port 1 View  Result Date: 01/29/2018 CLINICAL DATA:  84 year old female with weakness, bradycardia. EXAM: PORTABLE CHEST 1 VIEW COMPARISON:  Chest CT 12/09/2017 and earlier. FINDINGS: Portable AP semi upright view at 1648 hours. Pacer pads project over the chest. Stable cardiac size and mediastinal contours. Calcified aortic atherosclerosis. Stable lung volumes. No pneumothorax, pulmonary edema, pleural effusion or acute pulmonary opacity. Paucity bowel gas in the upper abdomen. IMPRESSION: No acute cardiopulmonary abnormality. Electronically Signed   By: Genevie Ann M.D.   On: 01/19/2018 17:12    Procedures Procedures (including critical care time)  Medications Ordered in ED Medications  sodium polystyrene (KAYEXALATE) 15 GM/60ML suspension 30 g (has no administration in time range)  calcium  gluconate 1 g in sodium chloride 0.9 % 100 mL IVPB (1 g Intravenous New  Bag/Given 02/04/2018 1821)  albuterol (PROVENTIL) (2.5 MG/3ML) 0.083% nebulizer solution 10 mg (10 mg Nebulization Given 02/03/2018 1811)  dextrose 50 % solution 50 mL (50 mLs Intravenous Given 01/14/2018 1802)  sodium chloride 0.9 % bolus 500 mL (500 mLs Intravenous New Bag/Given 01/08/2018 1802)  sodium bicarbonate injection 50 mEq (50 mEq Intravenous Given 01/30/2018 1808)  insulin aspart (novoLOG) injection 10 Units (10 Units Subcutaneous Given 01/08/2018 1813)     Initial Impression / Assessment and Plan / ED Course  I have reviewed the triage vital signs and the nursing notes.  Pertinent labs & imaging results that were available during my care of the patient were reviewed by me and considered in my medical decision making (see chart for details).     Patient is a 84 year old female who presents with weakness and rated cardia.  Her EKG shows a junctional bradycardia with a rate in the low 30s.  It did drop down into the 20s while she was here in the ED.  Her labs came back showing a hyperkalemia with a potassium is 6.1 and hyponatremia with a sodium of 120.  She was started on normal saline and giving medications for hyperkalemia including calcium gluconate, sodium bicarb, albuterol, insulin and D50 as well as Kayexalate.  Her heart rate has improved into the 40s.  She is maintaining a normal blood pressure and for this reason was not given atropine.  Her chest x-ray is clear without signs of fluid overload or pneumonia.  She has been seen by cardiology.  I spoke with Dr. Stanford Breed who will follow along as well.  I spoke with Dr. Myna Hidalgo with the hospitalist service to admit the patient.  CRITICAL CARE Performed by: Malvin Johns Total critical care time: 60 minutes Critical care time was exclusive of separately billable procedures and treating other patients. Critical care was necessary to treat or prevent imminent or life-threatening  deterioration. Critical care was time spent personally by me on the following activities: development of treatment plan with patient and/or surrogate as well as nursing, discussions with consultants, evaluation of patient's response to treatment, examination of patient, obtaining history from patient or surrogate, ordering and performing treatments and interventions, ordering and review of laboratory studies, ordering and review of radiographic studies, pulse oximetry and re-evaluation of patient's condition.   Final Clinical Impressions(s) / ED Diagnoses   Final diagnoses:  Symptomatic bradycardia  Hyperkalemia  Hyponatremia    ED Discharge Orders    None       Malvin Johns, MD 01/11/2018 1905

## 2018-01-10 NOTE — ED Notes (Signed)
Pt placed on bedpan

## 2018-01-10 NOTE — ED Notes (Signed)
Dr. Tamera Punt made aware of pts heart rate of 27 and potassium of 6.

## 2018-01-10 NOTE — H&P (Signed)
History and Physical    Jeanne Hart IOE:[id number] DOB: [birth year] DOA: 01/29/2018  PCP: [facility]   Patient coming from: Nursing home   Chief Complaint: Gen weakness, bradycardia   HPI: Jeanne Hart is a 84 y.o. female with medical history significant for paroxysmal atrial fibrillation not on anticoagulation due to history of bleeding and fall risk, hypertension, coronary artery disease, and hypothyroidism, now presenting to the emergency department from her nursing home for evaluation of generalized weakness and bradycardia.  Patient reports that she had some vomiting proximately 3 days ago that has since resolved, but she has been generally weak since that time.  She denies headache, change in vision or hearing, or focal numbness or weakness.  She denies chest pain.  She was noted at the nursing home to have heart rate at approximately 30 and was directed to the ED for evaluation of this.  ED Course: Upon arrival to the ED, patient is found to be afebrile, saturating well on room air, bradycardic in the low 30s, and with stable blood pressure.  EKG features a junctional rhythm with rate 31 and LBBB.  Chest x-rays negative for acute cardiopulmonary disease.  Chemistry panel reveals a sodium of 120, potassium 6.1, and creatinine of 2.48, up from 1.00 one month ago.  CBC features a mild normocytic anemia and leukocytosis.  Cardiology was consulted by the ED physician, has evaluated the patient in the emergency department, and patient was treated with Kayexalate, bicarbonate, insulin with dextrose, IV calcium, and albuterol in the ED.  Heart rate has improved, blood pressure remained stable, and she will be admitted to the stepdown unit recommends a medical admission.  For ongoing evaluation and management of symptomatic bradycardia.  Review of Systems:  All other systems reviewed and apart from HPI, are negative.  Past Medical History:  Diagnosis Date  . CAD S/P percutaneous  coronary angioplasty    PCI x 2; once at [facility] and once in [place];; Myoview April 2017: LOW RISK. EF 63%. No ischemia or infarction.  . Diverticulitis   . Dyslipidemia   . GERD (gastroesophageal reflux disease)   . Hiatal hernia   . HTN (hypertension)   . Hypothyroidism   . IBS (irritable bowel syndrome)   . Insomnia    chronic  . PAF (paroxysmal atrial fibrillation) (HCC)    CHA2DS2VASC = 4.  coumadin was stopped secondary to GI Bleed, on amiodarone; monitor march 2014-NS[facility]    Past Surgical History:  Procedure Laterality Date  . ABDOMINAL HYSTERECTOMY    . APPENDECTOMY    . CHOLECYSTECTOMY    . CORONARY ANGIOPLASTY WITH STENT PLACEMENT  01/05/2011   2.5x31mm Promus DES stent to RCA post dilated to 2.75x41mm   . CORONARY ANGIOPLASTY WITH STENT PLACEMENT  ?   3 stents per patient  . NM MYOVIEW LTD  April 2017   LOW RISK. EF 60%. No ischemia or infarction.     reports that she has never smoked. She has never used smokeless tobacco. She reports that she does not drink alcohol or use drugs.  Allergies  Allergen Reactions  . Nexium [Esomeprazole] Other (See Comments)    Aggravates IBS.   . Statins Other (See Comments)    Had jaundice as child.  Does tolerate current statin therapy.    Abel Presto [Phenazopyridine] Other (See Comments)    On MAR  . Codeine Other (See Comments)    Post-surgical reaction of unknown type.  . Morphine And Related Other (See  Comments)    Unknown.    Marland Kitchen Penicillins Other (See Comments)    Unknown. Has patient had a PCN reaction causing immediate rash, facial/tongue/throat swelling, SOB or lightheadedness with hypotension: YES Has patient had a PCN reaction causing severe rash involving mucus membranes or skin necrosis:NO Has patient had a PCN reaction that required hospitalization NO Has patient had a PCN reaction occurring within the last 10 years: NO If all of the above answers are "NO", then may proceed with Cephalosporin use.     . Sulfa  Antibiotics Other (See Comments)    Unknown childhood reaction.      Family History  Problem Relation Age of Onset  . Stroke Mother   . Diabetes Maternal Aunt   . Diabetes Son   . Heart disease Unknown        Both sides of family  . Colon cancer Neg Hx      Prior to Admission medications   Medication Sig Start Date End Date Taking? Authorizing Provider  amiodarone (PACERONE) 200 MG tablet TAKE 1 TABLET BY MOUTH ONCE DAILY AND 1 TABLET AS NEEDED FOR FAST HEART RATE 06/18/17   Leonie Man, MD  amLODipine (NORVASC) 5 MG tablet Take 1 tablet by mouth daily. take 1 tab dialy 05/07/15   [provider]  Ascorbic Acid (VITAMIN C PO) Take 1 tablet by mouth daily.     [provider]  aspirin EC 81 MG tablet Take 1 tablet (81 mg total) by mouth daily. 02/15/15   Leonie Man, MD  atenolol (TENORMIN) 25 MG tablet Take 25 mg by mouth daily.    [provider]  Cholecalciferol (VITAMIN D PO) Take 1 tablet by mouth daily.     [provider]  feeding supplement, ENSURE ENLIVE, (ENSURE ENLIVE) LIQD Take 237 mLs by mouth 2 (two) times daily between meals. 12/11/17   Raiford Noble Latif, DO  isosorbide mononitrate (IMDUR) 30 MG 24 hr tablet Take 1 tablet (30 mg total) by mouth daily. 09/09/14   Margarita Mail, PA-C  levothyroxine (SYNTHROID, LEVOTHROID) 100 MCG tablet Take 100 mcg by mouth daily before breakfast.    [provider]  nitroGLYCERIN (NITROSTAT) 0.4 MG SL tablet Place 1 tablet (0.4 mg total) under the tongue every 5 (five) minutes as needed for chest pain. MAX 3 doses 02/05/17   Leonie Man, MD  pantoprazole (PROTONIX) 40 MG tablet Take 40 mg by mouth daily.     [provider]  Polyethyl Glycol-Propyl Glycol (SYSTANE) 0.4-0.3 % SOLN Apply 2 drops to eye at bedtime.    [provider]  potassium chloride (K-DUR) 10 MEQ tablet Take 1 tablet (10 mEq total) by mouth daily. 03/05/16   Maryan Puls, MD  Solifenacin Succinate  (VESICARE PO) Take 1 tablet by mouth daily.    [provider]  zolpidem (AMBIEN CR) 12.5 MG CR tablet Take 12.5 mg by mouth every other day.    [provider]    Physical Exam: Vitals:   01/12/2018 1816 01/12/2018 1830 01/27/2018 1845 01/19/2018 1900  BP:  (!) 119/46 (!) 126/57 (!) 136/53  Pulse:  (!) 39 (!) 46 (!) 59  Resp:  19 17 14   Temp:      TempSrc:      SpO2: 100% 100% 100% 98%      Constitutional: NAD, calm  Eyes: PERTLA, lids and conjunctivae normal ENMT: Mucous membranes are moist. Posterior pharynx clear of any exudate or lesions.   Neck: normal,  supple, no masses, no thyromegaly Respiratory: clear to auscultation bilaterally, no wheezing, no crackles. Normal respiratory effort. No accessory muscle use.  Cardiovascular: Rate ~45 and regular. No significant JVD. Abdomen: No distension, no tenderness, no masses palpated. Bowel sounds normal.  Musculoskeletal: no clubbing / cyanosis. No joint deformity upper and lower extremities.    Skin: no significant rashes, lesions, ulcers. Poor turgor. Neurologic: CN 2-12 grossly intact. Sensation intact. Strength 5/5 in all 4 limbs.  Psychiatric: Alert and oriented x 3. Pleasant and cooperative.     Labs on Admission: I have personally reviewed following labs and imaging studies  CBC: Recent Labs  Lab 01/28/2018 1650 01/21/2018 1710  WBC 11.0*  --   NEUTROABS 8.2*  --   HGB 11.0* 12.6  HCT 31.1* 37.0  MCV 84.1  --   PLT 269  --    Basic Metabolic Panel: Recent Labs  Lab 01/28/2018 1650 01/09/2018 1710  NA 120* 120*  K 6.1* 6.0*  CL 94* 94*  CO2 16*  --   GLUCOSE 130* 128*  BUN 42* 42*  CREATININE 2.48* 2.40*  CALCIUM 8.3*  --    GFR: CrCl cannot be calculated (Unknown ideal weight.). Liver Function Tests: No results for input(s): AST, ALT, ALKPHOS, BILITOT, PROT, ALBUMIN in the last 168 hours. No results for input(s): LIPASE, AMYLASE in the last 168 hours. No results for input(s): AMMONIA in the last  168 hours. Coagulation Profile: No results for input(s): INR, PROTIME in the last 168 hours. Cardiac Enzymes: No results for input(s): CKTOTAL, CKMB, CKMBINDEX, TROPONINI in the last 168 hours. BNP (last 3 results) No results for input(s): PROBNP in the last 8760 hours. HbA1C: No results for input(s): HGBA1C in the last 72 hours. CBG: No results for input(s): GLUCAP in the last 168 hours. Lipid Profile: No results for input(s): CHOL, HDL, LDLCALC, TRIG, CHOLHDL, LDLDIRECT in the last 72 hours. Thyroid Function Tests: No results for input(s): TSH, T4TOTAL, FREET4, T3FREE, THYROIDAB in the last 72 hours. Anemia Panel: No results for input(s): VITAMINB12, FOLATE, FERRITIN, TIBC, IRON, RETICCTPCT in the last 72 hours. Urine analysis:    Component Value Date/Time   COLORURINE YELLOW 12/06/2017 2211   APPEARANCEUR CLEAR 12/06/2017 2211   LABSPEC 1.005 12/06/2017 2211   PHURINE 6.0 12/06/2017 2211   GLUCOSEU NEGATIVE 12/06/2017 2211   HGBUR SMALL (A) 12/06/2017 2211   BILIRUBINUR NEGATIVE 12/06/2017 2211   KETONESUR NEGATIVE 12/06/2017 2211   PROTEINUR NEGATIVE 12/06/2017 2211   UROBILINOGEN 0.2 05/10/2015 1741   NITRITE NEGATIVE 12/06/2017 2211   LEUKOCYTESUR SMALL (A) 12/06/2017 2211   Sepsis Labs: @LABRCNTIP (procalcitonin:4,lacticidven:4) )No results found for this or any previous visit (from the past 240 hour(s)).   Radiological Exams on Admission: Dg Chest Port 1 View  Result Date: 01/26/2018 CLINICAL DATA:  84 year old female with weakness, bradycardia. EXAM: PORTABLE CHEST 1 VIEW COMPARISON:  Chest CT 12/09/2017 and earlier. FINDINGS: Portable AP semi upright view at 1648 hours. Pacer pads project over the chest. Stable cardiac size and mediastinal contours. Calcified aortic atherosclerosis. Stable lung volumes. No pneumothorax, pulmonary edema, pleural effusion or acute pulmonary opacity. Paucity bowel gas in the upper abdomen. IMPRESSION: No acute cardiopulmonary abnormality.  Electronically Signed   By: Genevie Ann M.D.   On: 01/22/2018 17:12    EKG: Independently reviewed. Junctional rhythm, rate 31, LBBB.   Assessment/Plan   1. Symptomatic bradycardia  - Presents from nursing home with gen weakness and bradycardia  - Found to be in junctional rhythm with HR  50  - Cardiology consulting and much appreciated; amiodarone and atenolol held, potassium being corrected   - Continue cardiac monitoring, keeps pacer pads on, correct electrolytes, hold amio and atenolol    2. Hyperkalemia  - Serum potassium is 6.1 on admission in setting of AKI and supplemental potassium use  - Treated in ED with 30 g kayexalate, insulin/dextrose, bicarbonate, calcium, and albuterol  - Continue cardiac monitoring, continue gentle IVF hydration, follow serial chem panels    3. Hyponatremia  - Serum sodium is 120 on admission in setting of hypovolemia  - Treated in ED with 500 cc NS  - Check urine sodium and urine osmolality  - Follow serial chem panels and adjust IVF accordingly  - Goal sodium is ~127 by the evening of 4/5 and normal by evening of 4/6   4. Acute kidney injury superimposed on CKD stage III  - SCr is 2.18 on admission, up from 1.00 one month ago  - Likely prerenal azotemia given report of recent vomiting and clinical hypovolemia  - Treated with 500 cc NS in ED  - Check urine chemistries, continue gentle IVF hydration with close monitoring of electrolytes, renally-dose medications, avoid nephrotoxins    5. Paroxysmal atrial fibrillation  - In a junctional rhythm on admission with marked bradycardia  - amiodarone and atenolol held by cardiology d/t marked bradycardia  - CHADS-VASc is 43 (age x2, gender, CAD, HTN); not anticoagulated d/t bleeding hx and fall-risk  - Continue cardiac monitoring   6. CAD - No anginal complaints  - Continue ASA    7. Hypertension  - BP at goal  - Continue Norvasc, hold atenolol as above     DVT prophylaxis: sq heparin  Code Status:  Full  Family Communication: Discussed with patient Consults called: Cardiology Admission status: Inpatient    Vianne Bulls, MD [facility] Pager [phone]  If 7PM-7AM, please contact night-coverage [email or web address] Password [facility]  01/31/2018, 7:42 PM

## 2018-01-10 NOTE — ED Notes (Signed)
Dr. Belfi at bedside 

## 2018-01-11 ENCOUNTER — Other Ambulatory Visit: Payer: Self-pay

## 2018-01-11 ENCOUNTER — Encounter (HOSPITAL_COMMUNITY): Payer: Self-pay | Admitting: General Practice

## 2018-01-11 DIAGNOSIS — R001 Bradycardia, unspecified: Principal | ICD-10-CM

## 2018-01-11 DIAGNOSIS — E43 Unspecified severe protein-calorie malnutrition: Secondary | ICD-10-CM

## 2018-01-11 LAB — BASIC METABOLIC PANEL
ANION GAP: 9 (ref 5–15)
ANION GAP: 9 (ref 5–15)
Anion gap: 12 (ref 5–15)
Anion gap: 9 (ref 5–15)
BUN: 37 mg/dL — AB (ref 6–20)
BUN: 38 mg/dL — AB (ref 6–20)
BUN: 42 mg/dL — AB (ref 6–20)
BUN: 42 mg/dL — AB (ref 6–20)
CHLORIDE: 96 mmol/L — AB (ref 101–111)
CHLORIDE: 97 mmol/L — AB (ref 101–111)
CO2: 17 mmol/L — ABNORMAL LOW (ref 22–32)
CO2: 17 mmol/L — ABNORMAL LOW (ref 22–32)
CO2: 16 mmol/L — AB (ref 22–32)
CO2: 19 mmol/L — AB (ref 22–32)
CREATININE: 2.29 mg/dL — AB (ref 0.44–1.00)
CREATININE: 2.32 mg/dL — AB (ref 0.44–1.00)
Calcium: 7.7 mg/dL — ABNORMAL LOW (ref 8.9–10.3)
Calcium: 7.7 mg/dL — ABNORMAL LOW (ref 8.9–10.3)
Calcium: 8 mg/dL — ABNORMAL LOW (ref 8.9–10.3)
Calcium: 8.3 mg/dL — ABNORMAL LOW (ref 8.9–10.3)
Chloride: 97 mmol/L — ABNORMAL LOW (ref 101–111)
Chloride: 99 mmol/L — ABNORMAL LOW (ref 101–111)
Creatinine, Ser: 2.05 mg/dL — ABNORMAL HIGH (ref 0.44–1.00)
Creatinine, Ser: 1.82 mg/dL — ABNORMAL HIGH (ref 0.44–1.00)
GFR calc Af Amer: 20 mL/min — ABNORMAL LOW (ref >60)
GFR calc Af Amer: 21 mL/min — ABNORMAL LOW (ref >60)
GFR calc Af Amer: 24 mL/min — ABNORMAL LOW (ref >60)
GFR calc Af Amer: 27 mL/min — ABNORMAL LOW (ref >60)
GFR calc non Af Amer: 18 mL/min — ABNORMAL LOW (ref >60)
GFR calc non Af Amer: 18 mL/min — ABNORMAL LOW (ref >60)
GFR, EST NON AFRICAN AMERICAN: 20 mL/min — AB (ref >60)
GFR, EST NON AFRICAN AMERICAN: 24 mL/min — AB (ref >60)
GLUCOSE: 85 mg/dL (ref 65–99)
Glucose, Bld: 101 mg/dL — ABNORMAL HIGH (ref 65–99)
Glucose, Bld: 91 mg/dL (ref 65–99)
Glucose, Bld: 96 mg/dL (ref 65–99)
POTASSIUM: 4.6 mmol/L (ref 3.5–5.1)
POTASSIUM: 4.6 mmol/L (ref 3.5–5.1)
POTASSIUM: 4.6 mmol/L (ref 3.5–5.1)
POTASSIUM: 4.8 mmol/L (ref 3.5–5.1)
Sodium: 123 mmol/L — ABNORMAL LOW (ref 135–145)
Sodium: 124 mmol/L — ABNORMAL LOW (ref 135–145)
Sodium: 125 mmol/L — ABNORMAL LOW (ref 135–145)
Sodium: 125 mmol/L — ABNORMAL LOW (ref 135–145)

## 2018-01-11 LAB — CBC
HEMATOCRIT: 28.3 % — AB (ref 36.0–46.0)
Hemoglobin: 10 g/dL — ABNORMAL LOW (ref 12.0–15.0)
MCH: 29.4 pg (ref 26.0–34.0)
MCHC: 35.3 g/dL (ref 30.0–36.0)
MCV: 83.2 fL (ref 78.0–100.0)
PLATELETS: 217 10*3/uL (ref 150–400)
RBC: 3.4 MIL/uL — AB (ref 3.87–5.11)
RDW: 15.7 % — AB (ref 11.5–15.5)
WBC: 8.9 10*3/uL (ref 4.0–10.5)

## 2018-01-11 LAB — MRSA PCR SCREENING: MRSA by PCR: NEGATIVE

## 2018-01-11 MED ORDER — ATROPINE SULFATE 1 MG/ML IJ SOLN
0.5000 mg | Freq: Once | INTRAMUSCULAR | Status: AC
  Administered 2018-01-11: 0.5 mg via INTRAVENOUS
  Filled 2018-01-11: qty 1

## 2018-01-11 MED ORDER — ENSURE ENLIVE PO LIQD
237.0000 mL | Freq: Three times a day (TID) | ORAL | Status: DC
  Administered 2018-01-12 – 2018-01-19 (×17): 237 mL via ORAL

## 2018-01-11 MED ORDER — ATROPINE SULFATE 1 MG/ML IJ SOLN
0.5000 mg | INTRAMUSCULAR | Status: DC | PRN
  Administered 2018-01-17: 0.5 mg via INTRAVENOUS
  Filled 2018-01-11 (×2): qty 0.5

## 2018-01-11 NOTE — Progress Notes (Signed)
Patient ID: Jeanne Hart, female   DOB: June 08, 1927, 84 y.o.   MRN: [id number]  PROGRESS NOTE    Jeanne Hart  YQM:[id number] DOB: 04/10/1927 DOA: 01/11/18 PCP: [facility]   Outpatient Specialists: Dr Bryan Lemma, Cardiology   Brief Narrative:   Jeanne Hart is a 84 y.o. female with medical history significant for paroxysmal atrial fibrillation not on anticoagulation due to history of bleeding and fall risk, hypertension, coronary artery disease, and hypothyroidism, now presenting to the emergency department from her nursing home for evaluation of generalized weakness and bradycardia.  Patient reports that she had some vomiting proximately 3 days ago that has since resolved, but she has been generally weak since that time.  She denies headache, change in vision or hearing, or focal numbness or weakness.  She denies chest pain.  She was noted at the nursing home to have heart rate at approximately 30 and was directed to the ED for evaluation of this. Patient has remained bradycardic in the 30s. Family reported weakness over a long period of time. Off rate lowering medications of Atenolol and amiodarone now.   Assessment & Plan:   Principal Problem:   Symptomatic bradycardia Active Problems:   Essential hypertension   Paroxysmal atrial fibrillation [facility]): Not on Anticoagulation 2/2 GIB history.  CHA2DS2VASC =4   Hypothyroidism   CAD S/P percutaneous coronary angioplasty   CKD (chronic kidney disease) stage 3, GFR 30-59 ml/min (HCC)   AKI (acute kidney injury) (HCC)   Hyperkalemia   Hyponatremia   1. Symptomatic Bradycardia: Patient seen by Cardiology. Discussed with her and her daughter and they both agree that Pacemaker insertion will improve her quality of life if rate does not improve off medications. Will defer to cardiology/EP.  2. Hyperkalemia: Resolved  3. Hyponatremia:  Sodium improved to 125. Continue monitoring  4. AKI on CKDIII: Stable,  improved  5. CAD: Stable, Asymptomatic.  6. HTN: Controlled.  7. PAF: Atenolol and Amiodarone held. Monitor closely. Not on anticoagulation due to hx of bleed.     DVT prophylaxis: Heparin Code Status: Full Family Communication: Daughter at bedside Disposition Plan: To SNF  Consultants:   Dr Jens Som, Cardiology  Procedures: None  Antimicrobials: None  Subjective: Patient has no new complaint. Lying in bed. No new issues  Objective: Vitals:   01/11/18 0430 01/11/18 0445 01/11/18 0515 01/11/18 0524  BP: (!) 143/44 (!) 129/46 (!) 103/44   Pulse: (!) 32 (!) 33 (!) 36   Resp: 11 17 16    Temp:   97.6 F (36.4 C)   TempSrc:   Oral   SpO2: 100% 96% 94%   Weight:    55.6 kg (122 lb 9.2 oz)  Height:    5\' 5"  (1.651 m)    Intake/Output Summary (Last 24 hours) at 01/11/2018 0827 Last data filed at 2018-01-11 1914 Gross per 24 hour  Intake 500 ml  Output -  Net 500 ml   Filed Weights   01/11/18 0524  Weight: 55.6 kg (122 lb 9.2 oz)    Examination:  General exam: Appears calm and comfortable  Respiratory system: Clear to auscultation. Respiratory effort normal. Cardiovascular system: S1 & S2 heard, RRR. No JVD, murmurs, rubs, gallops or clicks. No pedal edema. Gastrointestinal system: Abdomen is nondistended, soft and nontender. No organomegaly or masses felt. Normal bowel sounds heard. Central nervous system: Alert and oriented. No focal neurological deficits. Extremities: Symmetric 5 x 5 power. Skin: No rashes, lesions or ulcers Psychiatry: Judgement and insight  appear normal. Mood & affect appropriate.     Data Reviewed: I have personally reviewed following labs and imaging studies  CBC: Recent Labs  Lab 02/05/2018 1650 01/23/2018 1710 01/11/18 0255  WBC 11.0*  --  8.9  NEUTROABS 8.2*  --   --   HGB 11.0* 12.6 10.0*  HCT 31.1* 37.0 28.3*  MCV 84.1  --  83.2  PLT 269  --  217   Basic Metabolic Panel: Recent Labs  Lab 01/25/2018 1650 01/27/2018 1710  01/28/2018 2333 01/11/18 0255  NA 120* 120* 124* 123*  K 6.1* 6.0* 4.8 4.6  CL 94* 94* 96* 97*  CO2 16*  --  16* 17*  GLUCOSE 130* 128* 101* 96  BUN 42* 42* 42* 42*  CREATININE 2.48* 2.40* 2.32* 2.29*  CALCIUM 8.3*  --  8.3* 8.0*   GFR: Estimated Creatinine Clearance: 14.9 mL/min (A) (by C-G formula based on SCr of 2.29 mg/dL (H)). Liver Function Tests: No results for input(s): AST, ALT, ALKPHOS, BILITOT, PROT, ALBUMIN in the last 168 hours. No results for input(s): LIPASE, AMYLASE in the last 168 hours. No results for input(s): AMMONIA in the last 168 hours. Coagulation Profile: No results for input(s): INR, PROTIME in the last 168 hours. Cardiac Enzymes: No results for input(s): CKTOTAL, CKMB, CKMBINDEX, TROPONINI in the last 168 hours. BNP (last 3 results) No results for input(s): PROBNP in the last 8760 hours. HbA1C: No results for input(s): HGBA1C in the last 72 hours. CBG: No results for input(s): GLUCAP in the last 168 hours. Lipid Profile: No results for input(s): CHOL, HDL, LDLCALC, TRIG, CHOLHDL, LDLDIRECT in the last 72 hours. Thyroid Function Tests: No results for input(s): TSH, T4TOTAL, FREET4, T3FREE, THYROIDAB in the last 72 hours. Anemia Panel: No results for input(s): VITAMINB12, FOLATE, FERRITIN, TIBC, IRON, RETICCTPCT in the last 72 hours. Urine analysis:    Component Value Date/Time   COLORURINE YELLOW 12/06/2017 2211   APPEARANCEUR CLEAR 12/06/2017 2211   LABSPEC 1.005 12/06/2017 2211   PHURINE 6.0 12/06/2017 2211   GLUCOSEU NEGATIVE 12/06/2017 2211   HGBUR SMALL (A) 12/06/2017 2211   BILIRUBINUR NEGATIVE 12/06/2017 2211   KETONESUR NEGATIVE 12/06/2017 2211   PROTEINUR NEGATIVE 12/06/2017 2211   UROBILINOGEN 0.2 05/10/2015 1741   NITRITE NEGATIVE 12/06/2017 2211   LEUKOCYTESUR SMALL (A) 12/06/2017 2211   Sepsis Labs: @LABRCNTIP (procalcitonin:4,lacticidven:4)  ) Recent Results (from the past 240 hour(s))  MRSA PCR Screening     Status: None    Collection Time: 01/11/18  5:29 AM  Result Value Ref Range Status   MRSA by PCR NEGATIVE NEGATIVE Final    Comment:        The GeneXpert MRSA Assay (FDA approved for NASAL specimens only), is one component of a comprehensive MRSA colonization surveillance program. It is not intended to diagnose MRSA infection nor to guide or monitor treatment for MRSA infections. Performed at [facility], [address] [place], [place] [address]          Radiology Studies: Dg Chest [place] 1 View  Result Date: 01/26/2018 CLINICAL DATA:  84 year old female with weakness, bradycardia. EXAM: PORTABLE CHEST 1 VIEW COMPARISON:  Chest CT 12/09/2017 and earlier. FINDINGS: Portable AP semi upright view at 1648 hours. Pacer pads project over the chest. Stable cardiac size and mediastinal contours. Calcified aortic atherosclerosis. Stable lung volumes. No pneumothorax, pulmonary edema, pleural effusion or acute pulmonary opacity. Paucity bowel gas in the upper abdomen. IMPRESSION: No acute cardiopulmonary abnormality. Electronically Signed   By:  Odessa Fleming M.D.   On: 01/26/2018 17:12        Scheduled Meds: . amLODipine  5 mg Oral Daily  . aspirin EC  81 mg Oral Daily  . darifenacin  7.5 mg Oral Daily  . feeding supplement (ENSURE ENLIVE)  237 mL Oral BID BM  . heparin  5,000 Units Subcutaneous Q8H  . isosorbide mononitrate  30 mg Oral Daily  . levothyroxine  100 mcg Oral QAC breakfast  . pantoprazole  40 mg Oral Daily  . polyvinyl alcohol  2 drop Both Eyes QHS  . sodium chloride flush  3 mL Intravenous Q12H   Continuous Infusions:   LOS: 1 day    Time spent: 36 minutes    Joenathan Sakuma,LAWAL, MD [facility] Pager [phone]  If 7PM-7AM, please contact night-coverage [email or web address] Password [facility] 01/11/2018, 8:27 AM

## 2018-01-11 NOTE — Progress Notes (Signed)
Initial Nutrition Assessment  DOCUMENTATION CODES:   Severe malnutrition in context of chronic illness  INTERVENTION:    Ensure Enlive po TID, each supplement provides 350 kcal and 20 grams of protein  NUTRITION DIAGNOSIS:   Severe Malnutrition related to chronic illness(IBS, CAD, CKD) as evidenced by severe muscle depletion, percent weight loss(6% weight loss within 1 month).  GOAL:   Patient will meet greater than or equal to 90% of their needs  MONITOR:   PO intake, Supplement acceptance, Labs  REASON FOR ASSESSMENT:   Malnutrition Screening Tool    ASSESSMENT:   84 yo female with PMH of CKD-III, CAD, PAF, IBS, HTN, HLD, hypothyroidism, and GERD who was admitted on 4/4 with weakness and bradycardia.   Spoke with patient and her daughter. Patient has been eating poorly for the past month related to PNA and IBS. She has lost a lot of weight, used to weigh 150-155 lbs. She likes vanilla Ensure, agreed to drink it between meals.   6% weight loss (130 lbs-->122 lbs) within the past month is significant for the time frame.  Labs reviewed sodium 125 (L) but trending up Medications reviewed.  NUTRITION - FOCUSED PHYSICAL EXAM:    Most Recent Value  Orbital Region  Mild depletion  Upper Arm Region  Mild depletion  Thoracic and Lumbar Region  Mild depletion  Buccal Region  Mild depletion  Temple Region  Severe depletion  Clavicle Bone Region  Severe depletion  Clavicle and Acromion Bone Region  Moderate depletion  Scapular Bone Region  Moderate depletion  Dorsal Hand  Severe depletion  Patellar Region  Moderate depletion  Anterior Thigh Region  Moderate depletion  Posterior Calf Region  Moderate depletion  Edema (RD Assessment)  None  Hair  Reviewed  Eyes  Reviewed  Mouth  Reviewed  Skin  Reviewed  Nails  Reviewed       Diet Order:  Diet Heart Room service appropriate? Yes; Fluid consistency: Thin  EDUCATION NEEDS:   No education needs have been identified  at this time  Skin:  Skin Assessment: Reviewed RN Assessment  Last BM:  4/5  Height:   Ht Readings from Last 1 Encounters:  01/11/18 5\' 5"  (1.651 m)    Weight:   Wt Readings from Last 1 Encounters:  01/11/18 122 lb 9.2 oz (55.6 kg)    Ideal Body Weight:  56.8 kg  BMI:  Body mass index is 20.4 kg/m.  Estimated Nutritional Needs:   Kcal:  1400-1600  Protein:  75-85 gm  Fluid:  1.4-1.6 L    Molli Barrows, RD, LDN, [place] Pager [phone] After Hours Pager [phone]

## 2018-01-11 NOTE — Progress Notes (Signed)
Progress Note  Patient Name: Jeanne Hart Date of Encounter: 01/11/2018  Primary Cardiologist: Dr Ellyn Hack  Subjective   Denies CP or dyspnea; remains weak  Inpatient Medications    Scheduled Meds: . amLODipine  5 mg Oral Daily  . aspirin EC  81 mg Oral Daily  . darifenacin  7.5 mg Oral Daily  . feeding supplement (ENSURE ENLIVE)  237 mL Oral BID BM  . heparin  5,000 Units Subcutaneous Q8H  . isosorbide mononitrate  30 mg Oral Daily  . levothyroxine  100 mcg Oral QAC breakfast  . pantoprazole  40 mg Oral Daily  . polyvinyl alcohol  2 drop Both Eyes QHS  . sodium chloride flush  3 mL Intravenous Q12H   Continuous Infusions:  PRN Meds: acetaminophen **OR** acetaminophen, atropine, ondansetron **OR** ondansetron (ZOFRAN) IV, senna-docusate, zolpidem   Vital Signs    Vitals:   01/11/18 0430 01/11/18 0445 01/11/18 0515 01/11/18 0524  BP: (!) 143/44 (!) 129/46 (!) 103/44   Pulse: (!) 32 (!) 33 (!) 36   Resp: 11 17 16    Temp:   97.6 F (36.4 C)   TempSrc:   Oral   SpO2: 100% 96% 94%   Weight:    122 lb 9.2 oz (55.6 kg)  Height:    5\' 5"  (1.651 m)    Intake/Output Summary (Last 24 hours) at 01/11/2018 0818 Last data filed at 01/12/2018 1914 Gross per 24 hour  Intake 500 ml  Output -  Net 500 ml   Filed Weights   01/11/18 0524  Weight: 122 lb 9.2 oz (55.6 kg)    Telemetry    Sinus bradycardia with intermittent junctional rhythm; HR 30s - Personally Reviewed   Physical Exam   GEN: Frail No acute distress.   Neck: No JVD Cardiac: Bradycardic Respiratory: Clear to auscultation bilaterally. GI: Soft, nontender, non-distended  MS: No edema Neuro:  Nonfocal  Psych: Normal affect   Labs    Chemistry Recent Labs  Lab 01/18/2018 1650 02/01/2018 1710 01/26/2018 2333 01/11/18 0255  NA 120* 120* 124* 123*  K 6.1* 6.0* 4.8 4.6  CL 94* 94* 96* 97*  CO2 16*  --  16* 17*  GLUCOSE 130* 128* 101* 96  BUN 42* 42* 42* 42*  CREATININE 2.48* 2.40* 2.32* 2.29*    CALCIUM 8.3*  --  8.3* 8.0*  GFRNONAA 16*  --  18* 18*  GFRAA 19*  --  20* 21*  ANIONGAP 10  --  12 9     Hematology Recent Labs  Lab 02/02/2018 1650 01/17/2018 1710 01/11/18 0255  WBC 11.0*  --  8.9  RBC 3.70*  --  3.40*  HGB 11.0* 12.6 10.0*  HCT 31.1* 37.0 28.3*  MCV 84.1  --  83.2  MCH 29.7  --  29.4  MCHC 35.4  --  35.3  RDW 16.1*  --  15.7*  PLT 269  --  217     Radiology    Dg Chest Port 1 View  Result Date: 01/22/2018 CLINICAL DATA:  84 year old female with weakness, bradycardia. EXAM: PORTABLE CHEST 1 VIEW COMPARISON:  Chest CT 12/09/2017 and earlier. FINDINGS: Portable AP semi upright view at 1648 hours. Pacer pads project over the chest. Stable cardiac size and mediastinal contours. Calcified aortic atherosclerosis. Stable lung volumes. No pneumothorax, pulmonary edema, pleural effusion or acute pulmonary opacity. Paucity bowel gas in the upper abdomen. IMPRESSION: No acute cardiopulmonary abnormality. Electronically Signed   By: Genevie Ann M.D.   On:  01/21/2018 17:12    Patient Profile     84 y.o. female with past medical history of paroxysmal atrial fibrillation, coronary artery disease, hypertension, hyperlipidemia admitted with severe bradycardia and electrolyte abnormalities.  Also with acute renal insufficiency. Last nuclear study April 2017 showed ejection fraction 63% and no ischemia or infarction.  Last echocardiogram March 2019 showed normal LV systolic function, mild mitral regurgitation, severe left atrial enlargement and mild right atrial enlargement.  Moderate pulmonary hypertension.      Assessment & Plan    1. Bradycardia-patient remains bradycardic this morning despite correcting hyperkalemia.  It should be noted she was taking both amiodarone and atenolol 25 mg daily at time of admission.  She was also noted to have acute renal insufficiency and atenolol is cleared renally.  It therefore may take longer to washout of patient's system.  However it is a  small dose and she remains significantly bradycardic.  I will ask electrophysiology to review for consideration/timing of pacemaker.  We will leave transcutaneous pads in place. 2. History of paroxysmal atrial fibrillation-amiodarone and atenolol on hold.  She apparently is not on anticoagulation because of history of bleeding and unstable gait. CHADSvasc 5.  3. Hyperkalemia-resolved with therapy 4. Coronary artery disease-continue aspirin.  She is intolerant to statins. 5. Acute on chronic stage III kidney disease-patient dehydrated on admission; continue hydration; renal function slightly improved compared to previous. Note LV function is normal. 6. Hypertension-continue present meds and follow.  7. Hyponatremia-gentle hydration with normal saline.  Correct sodium slowly.  Na improving. 8. Weight loss-further evaluation per primary care.   For questions or updates, please contact [place] Please consult [email or web address] for contact info under Cardiology/STEMI.      Signed, Kirk Ruths, MD  01/11/2018, 8:18 AM

## 2018-01-11 NOTE — Progress Notes (Signed)
Pt HR is still in the 30's-40's. Pt still NPO awaiting EP consult. Dr. Stanford Breed made aware.  Ferdinand Lango, RN

## 2018-01-11 NOTE — Consult Note (Addendum)
Cardiology Consultation:   Patient ID: Jeanne Hart; [id number]; April 20, 1927   Admit date: 2018-01-31 Date of Consult: 01/11/2018  Primary Care Provider: Loman Brooklyn, MD Primary Cardiologist: Dr Herbie Baltimore Primary Electrophysiologist:  New Dr Elberta Fortis   Patient Profile:   Jeanne Hart is a 84 y.o. female with a hx of CAD and PAF who is being seen today for the evaluation of bradycardia at the request of Dr Jens Som.  History of Present Illness:   [name] is an 84 y/o female who had prio PCI-last in 2012. She had a Myoview in April 2017 that was low risk and an echo in March 2019 showed her EF to be 65-70%. She was admitted with a fall 12/07/17. EKG then showed HR 48- NSR. She was told she also had pneumonia. She is pretty frail and was discharged to a Rehab facility, then assisted living. She has a history of PAF and has been on Amiodarone (pt can't remember how long but records indicate at least a year. She is not anticoagulated secondary to a history of past GI bleeding.   Over the past 4 days she notes increased weakness.  She denies chest pain, dyspnea, palpitations or syncope.  She presented to the emergency room 01/31/2018 and was found to have a heart rate in the 30s. Her K+ was also 6.1 and BUN/SCr 42/2.48- new AKI for her. Her K+ was treated and her Amiodarone and Atenolol were held.  We are asked to evaluate now for possible pacemaker as her HR as of this AM was still 30-40.    Past Medical History:  Diagnosis Date  . Anxiety   . Arthritis    "back" (01/11/2018)  . Basal cell carcinoma    "face; back" (01/11/2018)  . CAD S/P percutaneous coronary angioplasty    PCI x 2; once at [facility] and once in [place];; Myoview April 2017: LOW RISK. EF 63%. No ischemia or infarction.  . Diverticulitis   . Dyslipidemia   . GERD (gastroesophageal reflux disease)   . Hepatitis    "as an infant/child" (01/11/2018)  . Hiatal hernia   . History of kidney stones   . HTN  (hypertension)   . Hypothyroidism   . IBS (irritable bowel syndrome)   . Insomnia    chronic  . Migraine    "stopped ~ age 78" (01/11/2018)  . PAF (paroxysmal atrial fibrillation) (HCC)    CHA2DS2VASC = 4.  coumadin was stopped secondary to GI Bleed, on amiodarone; monitor march 2014-NSR  . Pneumonia 12/2017  . Squamous carcinoma    "face; back" (01/11/2018)    Past Surgical History:  Procedure Laterality Date  . ABDOMINAL HYSTERECTOMY    . APPENDECTOMY    . BASAL CELL CARCINOMA EXCISION     "face; back"  . CARDIAC CATHETERIZATION     "just to check on stents"   . CARPAL TUNNEL RELEASE Left    "didn't work"  . CATARACT EXTRACTION W/ INTRAOCULAR LENS  IMPLANT, BILATERAL Bilateral   . CHOLECYSTECTOMY OPEN    . CORONARY ANGIOPLASTY WITH STENT PLACEMENT  01/05/2011   2.5x1mm Promus DES stent to RCA post dilated to 2.75x59mm   . CORONARY ANGIOPLASTY WITH STENT PLACEMENT  ?   3 stents per patient  . NM MYOVIEW LTD  April 2017   LOW RISK. EF 60%. No ischemia or infarction.  Marland Kitchen SQUAMOUS CELL CARCINOMA EXCISION     "face; back"     Home Medications:  Prior to Admission medications  Medication Sig Start Date End Date Taking? Authorizing Provider  amiodarone (PACERONE) 200 MG tablet TAKE 1 TABLET BY MOUTH ONCE DAILY AND 1 TABLET AS NEEDED FOR FAST HEART RATE Patient taking differently: TAKE 200 mg  TABLET BY MOUTH ONCE DAILY 06/18/17  Yes Marykay Lex, MD  amLODipine (NORVASC) 5 MG tablet Take 1 tablet by mouth daily. take 1 tab dialy 05/07/15  Yes [provider]  Ascorbic Acid (VITAMIN C PO) Take 1 tablet by mouth daily.    Yes [provider]  aspirin EC 81 MG tablet Take 1 tablet (81 mg total) by mouth daily. 02/15/15  Yes Marykay Lex, MD  atenolol (TENORMIN) 25 MG tablet Take 25 mg by mouth daily.   Yes [provider]  Cholecalciferol (VITAMIN D PO) Take 1 tablet by mouth daily.    Yes [provider]  isosorbide mononitrate (IMDUR) 30 MG  24 hr tablet Take 1 tablet (30 mg total) by mouth daily. 09/09/14  Yes Harris, Abigail, PA-C  levothyroxine (SYNTHROID, LEVOTHROID) 100 MCG tablet Take 100 mcg by mouth daily before breakfast.   Yes [provider]  nitroGLYCERIN (NITROSTAT) 0.4 MG SL tablet Place 1 tablet (0.4 mg total) under the tongue every 5 (five) minutes as needed for chest pain. MAX 3 doses 02/05/17  Yes Marykay Lex, MD  pantoprazole (PROTONIX) 40 MG tablet Take 40 mg by mouth daily.    Yes [provider]  Polyethyl Glycol-Propyl Glycol (SYSTANE) 0.4-0.3 % SOLN Apply 2 drops to eye at bedtime.   Yes [provider]  potassium chloride (K-DUR) 10 MEQ tablet Take 1 tablet (10 mEq total) by mouth daily. 03/05/16  Yes Lindalou Hose, MD  potassium chloride (K-DUR,KLOR-CON) 10 MEQ tablet Take 10 mEq by mouth daily. 01/13/2018  Yes [provider]  promethazine (PHENERGAN) 25 MG tablet Take 25 mg by mouth every 6 (six) hours as needed. 01/08/18  Yes [provider]  Solifenacin Succinate (VESICARE PO) Take 1 tablet by mouth daily.   Yes [provider]  zolpidem (AMBIEN CR) 12.5 MG CR tablet Take 12.5 mg by mouth every other day.   Yes [provider]  feeding supplement, ENSURE ENLIVE, (ENSURE ENLIVE) LIQD Take 237 mLs by mouth 2 (two) times daily between meals. 12/11/17   Merlene Laughter, DO    Inpatient Medications: Scheduled Meds: . amLODipine  5 mg Oral Daily  . aspirin EC  81 mg Oral Daily  . darifenacin  7.5 mg Oral Daily  . feeding supplement (ENSURE ENLIVE)  237 mL Oral BID BM  . heparin  5,000 Units Subcutaneous Q8H  . isosorbide mononitrate  30 mg Oral Daily  . levothyroxine  100 mcg Oral QAC breakfast  . pantoprazole  40 mg Oral Daily  . polyvinyl alcohol  2 drop Both Eyes QHS  . sodium chloride flush  3 mL Intravenous Q12H   Continuous Infusions:  PRN Meds: acetaminophen **OR** acetaminophen, atropine, ondansetron **OR** ondansetron (ZOFRAN) IV,  senna-docusate, zolpidem  Allergies:    Allergies  Allergen Reactions  . Nexium [Esomeprazole] Other (See Comments)    Aggravates IBS.   . Statins Other (See Comments)    Had jaundice as child.  Does tolerate current statin therapy.    Concepcion Elk [Phenazopyridine] Other (See Comments)    On MAR  . Codeine Other (See Comments)    Post-surgical reaction of unknown type.  . Morphine And Related Other (See Comments)    Unknown.    Marland Kitchen  Penicillins Other (See Comments)    Unknown. Has patient had a PCN reaction causing immediate rash, facial/tongue/throat swelling, SOB or lightheadedness with hypotension: YES Has patient had a PCN reaction causing severe rash involving mucus membranes or skin necrosis:NO Has patient had a PCN reaction that required hospitalization NO Has patient had a PCN reaction occurring within the last 10 years: NO If all of the above answers are "NO", then may proceed with Cephalosporin use.     . Sulfa Antibiotics Other (See Comments)    Unknown childhood reaction.      Social History:   Social History   Socioeconomic History  . Marital status: Widowed    Spouse name: Not on file  . Number of children: 3  . Years of education: Not on file  . Highest education level: Not on file  Occupational History  . Occupation: Sales/Office-retired  Social Needs  . Financial resource strain: Not on file  . Food insecurity:    Worry: Not on file    Inability: Not on file  . Transportation needs:    Medical: Not on file    Non-medical: Not on file  Tobacco Use  . Smoking status: Never Smoker  . Smokeless tobacco: Never Used  Substance and Sexual Activity  . Alcohol use: No  . Drug use: No  . Sexual activity: Not on file  Lifestyle  . Physical activity:    Days per week: Not on file    Minutes per session: Not on file  . Stress: Not on file  Relationships  . Social connections:    Talks on phone: Not on file    Gets together: Not on file    Attends religious  service: Not on file    Active member of club or organization: Not on file    Attends meetings of clubs or organizations: Not on file    Relationship status: Not on file  . Intimate partner violence:    Fear of current or ex partner: Not on file    Emotionally abused: Not on file    Physically abused: Not on file    Forced sexual activity: Not on file  Other Topics Concern  . Not on file  Social History Narrative  . Not on file    Family History:    Family History  Problem Relation Age of Onset  . Stroke Mother   . Diabetes Maternal Aunt   . Diabetes Son   . Heart disease Unknown        Both sides of family  . Colon cancer Neg Hx      ROS:  Please see the history of present illness.  All other ROS reviewed and negative.     Physical Exam/Data:   Vitals:   01/11/18 0515 01/11/18 0524 01/11/18 0951 01/11/18 1234  BP: (!) 103/44  (!) 134/57 (!) 106/53  Pulse: (!) 36   (!) 39  Resp: 16   (!) 9  Temp: 97.6 F (36.4 C)   97.7 F (36.5 C)  TempSrc: Oral   Oral  SpO2: 94%   97%  Weight:  122 lb 9.2 oz (55.6 kg)    Height:  5\' 5"  (1.651 m)      Intake/Output Summary (Last 24 hours) at 01/11/2018 1436 Last data filed at 2018/02/04 1914 Gross per 24 hour  Intake 500 ml  Output -  Net 500 ml   Filed Weights   01/11/18 0524  Weight: 122 lb 9.2 oz (55.6 kg)  Body mass index is 20.4 kg/m.  General:  Elderly, frail, NAD HEENT: normal Lymph: no adenopathy Neck: no JVD, no bruit Endocrine:  No thryomegaly Vascular: No carotid bruits; FA pulses 2+ bilaterally without bruits  Cardiac:  normal S1, S2; RRR; no murmur  Lungs:  Rhonchi/rales Rt base, decreased breath sounds Lt base Abd: soft, nontender, no hepatomegaly  Ext: no edema Musculoskeletal:  No deformities, BUE and BLE strength normal and equal Skin: warm and dry  Neuro:  CNs 2-12 intact, no focal abnormalities noted Psych:  Normal affect   EKG:  The EKG 02-04-2018 was personally reviewed and  demonstrates:Junctional rhythm with rate of 30 and wide QR   Telemetry:  Telemetry was personally reviewed and demonstrates:  As of 3 pm telemetry shows NSR-50  Relevant CV Studies: Echo 12/09/17- Study Conclusions  - Left ventricle: The cavity size was normal. There was focal basal   hypertrophy. Systolic function was vigorous. The estimated   ejection fraction was in the range of 65% to 70%. Diastolic   function is abnormal, indeterminant grade. Wall motion was   normal; there were no regional wall motion abnormalities. - Aortic valve: Mildly calcified annulus. Trileaflet; mildly   thickened leaflets. Valve area (VTI): 1.93 cm^2. Valve area   (Vmax): 1.7 cm^2. Valve area (Vmean): 1.81 cm^2. - Mitral valve: Mildly calcified annulus. Mildly thickened leaflets   . There was mild regurgitation. - Left atrium: The atrium was severely dilated. - Right atrium: The atrium was mildly dilated. - Pulmonary arteries: Systolic pressure was moderately increased.   PA peak pressure: 56 mm Hg (S). - Technically adequate study.  Laboratory Data:  Chemistry Recent Labs  Lab 02-04-2018 2333 01/11/18 0255 01/11/18 1002  NA 124* 123* 125*  K 4.8 4.6 4.6  CL 96* 97* 97*  CO2 16* 17* 19*  GLUCOSE 101* 96 91  BUN 42* 42* 38*  CREATININE 2.32* 2.29* 2.05*  CALCIUM 8.3* 8.0* 7.7*  GFRNONAA 18* 18* 20*  GFRAA 20* 21* 24*  ANIONGAP 12 9 9     No results for input(s): PROT, ALBUMIN, AST, ALT, ALKPHOS, BILITOT in the last 168 hours. Hematology Recent Labs  Lab 02-04-2018 1650 02/04/18 1710 01/11/18 0255  WBC 11.0*  --  8.9  RBC 3.70*  --  3.40*  HGB 11.0* 12.6 10.0*  HCT 31.1* 37.0 28.3*  MCV 84.1  --  83.2  MCH 29.7  --  29.4  MCHC 35.4  --  35.3  RDW 16.1*  --  15.7*  PLT 269  --  217   Cardiac EnzymesNo results for input(s): TROPONINI in the last 168 hours. No results for input(s): TROPIPOC in the last 168 hours.  BNPNo results for input(s): BNP, PROBNP in the last 168 hours.  DDimer  No results for input(s): DDIMER in the last 168 hours.  Radiology/Studies:  Dg Chest Port 1 View  Result Date: Feb 04, 2018 CLINICAL DATA:  84 year old female with weakness, bradycardia. EXAM: PORTABLE CHEST 1 VIEW COMPARISON:  Chest CT 12/09/2017 and earlier. FINDINGS: Portable AP semi upright view at 1648 hours. Pacer pads project over the chest. Stable cardiac size and mediastinal contours. Calcified aortic atherosclerosis. Stable lung volumes. No pneumothorax, pulmonary edema, pleural effusion or acute pulmonary opacity. Paucity bowel gas in the upper abdomen. IMPRESSION: No acute cardiopulmonary abnormality. Electronically Signed   By: Odessa Fleming M.D.   On: 04-Feb-2018 17:12    Assessment and Plan:   1. Bradycardia-patient remained bradycardic this morning despite correcting hyperkalemia.  It should be  noted she was taking both amiodarone and atenolol 25 mg daily at time of admission.  She was also noted to have acute renal insufficiency and atenolol is cleared renally. Fortunately her HR has improved as of 3 pm.   2. History of paroxysmal atrial fibrillation-amiodarone and atenolol on hold. She apparently is not on anticoagulation because of history of bleeding and unstable gait. CHADSvasc 5. 3. Hyperkalemia-resolved with therapy 4. Coronary artery disease-continue aspirin. Myoview April 2019 was low risk. She is intolerant to statins. 5. Acute on chronic stage III kidney disease-patient dehydrated on admission; continue hydration; renal function slightly improved compared to previous. Note LV function is normal. 6. Hypertension-continue present meds and follow.  7. Hyponatremia-gentle hydration with normal saline. Correct sodium slowly. Na improving..  Plan: Zettie Gootee review with Dr Elberta Fortis. It appears we can hold off on a pacemaker and continue to observe off Atenolol and Amiodarone. TSH was 1.04 January 2018- I see no need to repeat.    For questions or updates, please contact [facility] Please consult [email or web address] for contact info under Cardiology/STEMI.   Signed, Corine Shelter, PA-C  01/11/2018 2:36 PM   I have seen and examined this patient with Corine Shelter.  Agree with above, note added to reflect my findings.  On exam, RRR, no murmurs, lungs clear. [place] presented with significnat bradycardia. Atenolol and amiodarone held and has since had HR in the 50s this afternoon in sinus rhythm. She is feeling well. Would plan to ambulate and if she does well, would plan for discharge. Would start Multaq as an outpatient once amiodarone has washed out for AF control. I discussed with her the symptoms of significant bradycardia and she Nuha Degner call or come to the ER if she experiences any.    [place] Jakubiak M. Shila Kruczek MD 01/11/2018 4:24 PM

## 2018-01-12 ENCOUNTER — Encounter (HOSPITAL_COMMUNITY): Payer: Self-pay | Admitting: *Deleted

## 2018-01-12 MED ORDER — AMIODARONE HCL 100 MG PO TABS
100.0000 mg | ORAL_TABLET | Freq: Every day | ORAL | Status: DC
  Administered 2018-01-12 – 2018-01-14 (×3): 100 mg via ORAL
  Filled 2018-01-12 (×3): qty 1

## 2018-01-12 MED ORDER — SODIUM CHLORIDE 0.9 % IV SOLN
INTRAVENOUS | Status: AC
  Administered 2018-01-12: 09:00:00 via INTRAVENOUS

## 2018-01-12 NOTE — Progress Notes (Addendum)
Progress Note  Patient Name: Jeanne Hart Date of Encounter: 01/12/2018  Primary Cardiologist: Dr Ellyn Hack  Subjective   No chest pain or dyspnea; energy improved  Inpatient Medications    Scheduled Meds: . amLODipine  5 mg Oral Daily  . aspirin EC  81 mg Oral Daily  . darifenacin  7.5 mg Oral Daily  . feeding supplement (ENSURE ENLIVE)  237 mL Oral TID BM  . heparin  5,000 Units Subcutaneous Q8H  . isosorbide mononitrate  30 mg Oral Daily  . levothyroxine  100 mcg Oral QAC breakfast  . pantoprazole  40 mg Oral Daily  . polyvinyl alcohol  2 drop Both Eyes QHS  . sodium chloride flush  3 mL Intravenous Q12H   Continuous Infusions: . sodium chloride 50 mL/hr at 01/12/18 0916   PRN Meds: acetaminophen **OR** acetaminophen, atropine, ondansetron **OR** ondansetron (ZOFRAN) IV, senna-docusate, zolpidem   Vital Signs    Vitals:   01/12/18 0430 01/12/18 0437 01/12/18 0858 01/12/18 0912  BP: (!) 130/57  121/68 (!) 150/57  Pulse: 60  63   Resp: 16  16   Temp: 98.8 F (37.1 C)     TempSrc: Oral     SpO2:   92%   Weight:  120 lb 14.4 oz (54.8 kg)    Height:        Intake/Output Summary (Last 24 hours) at 01/12/2018 0950 Last data filed at 01/11/2018 2130 Gross per 24 hour  Intake 3 ml  Output -  Net 3 ml   Filed Weights   01/11/18 0524 01/12/18 0437  Weight: 122 lb 9.2 oz (55.6 kg) 120 lb 14.4 oz (54.8 kg)    Telemetry    Sinus bradycardia with HR 50s - Personally Reviewed   Physical Exam   GEN: Frail NAD Neck: supple Cardiac: regular, mildly bradycardic Respiratory: CTA; no wheeze GI: Soft, nontender, non-distended, no masses MS: No edema Neuro:  Grossly intact  Labs    Chemistry Recent Labs  Lab 01/11/18 0255 01/11/18 1002 01/11/18 1541  NA 123* 125* 125*  K 4.6 4.6 4.6  CL 97* 97* 99*  CO2 17* 19* 17*  GLUCOSE 96 91 85  BUN 42* 38* 37*  CREATININE 2.29* 2.05* 1.82*  CALCIUM 8.0* 7.7* 7.7*  GFRNONAA 18* 20* 24*  GFRAA 21* 24* 27*    ANIONGAP 9 9 9      Hematology Recent Labs  Lab 01/11/2018 1650 01/22/2018 1710 01/11/18 0255  WBC 11.0*  --  8.9  RBC 3.70*  --  3.40*  HGB 11.0* 12.6 10.0*  HCT 31.1* 37.0 28.3*  MCV 84.1  --  83.2  MCH 29.7  --  29.4  MCHC 35.4  --  35.3  RDW 16.1*  --  15.7*  PLT 269  --  217     Radiology    Dg Chest Port 1 View  Result Date: 01/19/2018 CLINICAL DATA:  84 year old female with weakness, bradycardia. EXAM: PORTABLE CHEST 1 VIEW COMPARISON:  Chest CT 12/09/2017 and earlier. FINDINGS: Portable AP semi upright view at 1648 hours. Pacer pads project over the chest. Stable cardiac size and mediastinal contours. Calcified aortic atherosclerosis. Stable lung volumes. No pneumothorax, pulmonary edema, pleural effusion or acute pulmonary opacity. Paucity bowel gas in the upper abdomen. IMPRESSION: No acute cardiopulmonary abnormality. Electronically Signed   By: Genevie Ann M.D.   On: 01/30/2018 17:12    Patient Profile     84 y.o. female with past medical history of paroxysmal atrial fibrillation,  coronary artery disease, hypertension, hyperlipidemia admitted with severe bradycardia and electrolyte abnormalities.  Also with acute renal insufficiency. Last nuclear study April 2017 showed ejection fraction 63% and no ischemia or infarction.  Last echocardiogram March 2019 showed normal LV systolic function, mild mitral regurgitation, severe left atrial enlargement and mild right atrial enlargement.  Moderate pulmonary hypertension.      Assessment & Plan    1. Bradycardia-heart rate has improved.  I believe the etiology of her bradycardia at time of admission was dehydration resulting in worsening renal function with accumulation of atenolol which is cleared renally.  We will continue off of atenolol.  Resume amiodarone.  No indication for pacemaker.   2. History of paroxysmal atrial fibrillation-resume amiodarone 100 mg daily; will not treat with multaq. She apparently is not on  anticoagulation because of history of bleeding and unstable gait. CHADSvasc 5.  3. Coronary artery disease-continue aspirin.  She is intolerant to statins. 4. Acute on chronic stage III kidney disease-renal function improving with hydration.  5. Hypertension-reasonably well controlled; continue present meds and follow. 6. Hyponatremia-Na improving; management per IM. 7. Weight loss-further evaluation per primary care.  We will follow from a distance. For questions or updates, please contact [place] Please consult [email or web address] for contact info under Cardiology/STEMI.      Signed, Kirk Ruths, MD  01/12/2018, 9:50 AM

## 2018-01-12 NOTE — Progress Notes (Signed)
Patient ID: Jeanne Hart, female   DOB: 07/15/1927, 84 y.o.   MRN: [id number]                                                                PROGRESS NOTE                                                                                                                                                                                                             Patient Demographics:    Jeanne Hart, is a 84 y.o. female, DOB - Mar 10, 1927, EXH:[id number]  Admit date - 02/05/2018   Admitting Physician Vianne Bulls, MD  Outpatient Primary MD for the patient is Odette Horns, MD  LOS - 2  Outpatient Specialists:      Chief Complaint  Patient presents with  . Bradycardia       Brief Narrative     84 y.o. female with medical history significant for paroxysmal atrial fibrillation not on anticoagulation due to history of bleeding and fall risk, hypertension, coronary artery disease, and hypothyroidism, now presenting to the emergency department from her nursing home for evaluation of generalized weakness and bradycardia.  Patient reports that she had some vomiting proximately 3 days ago that has since resolved, but she has been generally weak since that time.  She denies headache, change in vision or hearing, or focal numbness or weakness.  She denies chest pain.  She was noted at the nursing home to have heart rate at approximately 30 and was directed to the ED for evaluation of this.  ED Course: Upon arrival to the ED, patient is found to be afebrile, saturating well on room air, bradycardic in the low 30s, and with stable blood pressure.  EKG features a junctional rhythm with rate 31 and LBBB.  Chest x-rays negative for acute cardiopulmonary disease.  Chemistry panel reveals a sodium of 120, potassium 6.1, and creatinine of 2.48, up from 1.00 one month ago.  CBC features a mild normocytic anemia and leukocytosis.  Cardiology was consulted by the ED physician, has evaluated the patient in the  emergency department, and patient was treated with Kayexalate, bicarbonate, insulin with dextrose, IV calcium, and albuterol in the ED.  Heart rate has improved, blood pressure remained stable, and she will be admitted to the stepdown  unit recommends a medical admission.  For ongoing evaluation and management of symptomatic bradycardia.     Subjective:    [name] today is doing well.  Hr improved.  Still slightly weak.  Sodium still low.     No headache, No chest pain, No abdominal pain - No Nausea, No new weakness tingling or numbness, No Cough - SOB.    Assessment  & Plan :    Principal Problem:   Symptomatic bradycardia Active Problems:   Essential hypertension   Paroxysmal atrial fibrillation [facility]): Not on Anticoagulation 2/2 GIB history.  CHA2DS2VASC =4   Hypothyroidism   CAD S/P percutaneous coronary angioplasty   CKD (chronic kidney disease) stage 3, GFR 30-59 ml/min (HCC)   AKI (acute kidney injury) (HCC)   Hyperkalemia   Hyponatremia   Protein-calorie malnutrition, severe     1. Symptomatic Bradycardia: Patient seen by Cardiology. Discussed with her and her daughter and they both agree that Pacemaker insertion will improve her quality of life if rate does not improve off medications. Will defer to cardiology/EP.  2. Hyperkalemia: Resolved  3. Hyponatremia:  Sodium improved to 125. Continue monitoring, hydrate gently with ns iv Check cmp in am  4. AKI on CKDIII: Stable, improved Check cmp in am  5. CAD: Stable, Asymptomatic.  6. HTN: Controlled.  7. PAF: Atenolol and Amiodarone held. Monitor closely. Not on anticoagulation due to hx of bleed.        Code Status :  FULL CODE  Family Communication  : w patient  Disposition Plan  : home  Barriers For Discharge :   Consults  :  cardiology  Procedures  :      DVT Prophylaxis  :  Heparin-- SCDs   Lab Results  Component Value Date   PLT 217 01/11/2018    Antibiotics  :  none  Anti-infectives (From admission, onward)   None        Objective:   Vitals:   01/11/18 2121 01/12/18 0430 01/12/18 0437 01/12/18 0858  BP: (!) 118/49 (!) 130/57  121/68  Pulse: (!) 51 60  63  Resp: 14 16  16   Temp: 98.5 F (36.9 C) 98.8 F (37.1 C)    TempSrc: Oral Oral    SpO2: 96%   92%  Weight:   54.8 kg (120 lb 14.4 oz)   Height:        Wt Readings from Last 3 Encounters:  01/12/18 54.8 kg (120 lb 14.4 oz)  12/07/17 59.2 kg (130 lb 8.2 oz)  08/23/17 61.2 kg (135 lb)     Intake/Output Summary (Last 24 hours) at 01/12/2018 0902 Last data filed at 01/11/2018 2130 Gross per 24 hour  Intake 3 ml  Output -  Net 3 ml     Physical Exam  Awake Alert, Oriented X 3, No new F.N deficits, Normal affect [place].AT,PERRAL Supple Neck,No JVD, No cervical lymphadenopathy appriciated.  Symmetrical Chest wall movement, Good air movement bilaterally, CTAB RRR,No Gallops,Rubs or new Murmurs, No Parasternal Heave +ve B.Sounds, Abd Soft, No tenderness, No organomegaly appriciated, No rebound - guarding or rigidity. No Cyanosis, Clubbing or edema, No new Rash or bruise     Data Review:    CBC Recent Labs  Lab 01/25/2018 1650 01/18/2018 1710 01/11/18 0255  WBC 11.0*  --  8.9  HGB 11.0* 12.6 10.0*  HCT 31.1* 37.0 28.3*  PLT 269  --  217  MCV 84.1  --  83.2  MCH 29.7  --  29.4  MCHC 35.4  --  35.3  RDW 16.1*  --  15.7*  LYMPHSABS 2.0  --   --   MONOABS 0.7  --   --   EOSABS 0.0  --   --   BASOSABS 0.0  --   --     Chemistries  Recent Labs  Lab 01/17/2018 1650 02/05/2018 1710 02/04/2018 2333 01/11/18 0255 01/11/18 1002 01/11/18 1541  NA 120* 120* 124* 123* 125* 125*  K 6.1* 6.0* 4.8 4.6 4.6 4.6  CL 94* 94* 96* 97* 97* 99*  CO2 16*  --  16* 17* 19* 17*  GLUCOSE 130* 128* 101* 96 91 85  BUN 42* 42* 42* 42* 38* 37*  CREATININE 2.48* 2.40* 2.32* 2.29* 2.05* 1.82*  CALCIUM 8.3*  --  8.3* 8.0* 7.7* 7.7*    ------------------------------------------------------------------------------------------------------------------ No results for input(s): CHOL, HDL, LDLCALC, TRIG, CHOLHDL, LDLDIRECT in the last 72 hours.  No results found for: HGBA1C ------------------------------------------------------------------------------------------------------------------ No results for input(s): TSH, T4TOTAL, T3FREE, THYROIDAB in the last 72 hours.  Invalid input(s): FREET3 ------------------------------------------------------------------------------------------------------------------ No results for input(s): VITAMINB12, FOLATE, FERRITIN, TIBC, IRON, RETICCTPCT in the last 72 hours.  Coagulation profile No results for input(s): INR, PROTIME in the last 168 hours.  No results for input(s): DDIMER in the last 72 hours.  Cardiac Enzymes No results for input(s): CKMB, TROPONINI, MYOGLOBIN in the last 168 hours.  Invalid input(s): CK ------------------------------------------------------------------------------------------------------------------    Component Value Date/Time   BNP 206.5 (H) 10/19/2016 1630    Inpatient Medications  Scheduled Meds: . amLODipine  5 mg Oral Daily  . aspirin EC  81 mg Oral Daily  . darifenacin  7.5 mg Oral Daily  . feeding supplement (ENSURE ENLIVE)  237 mL Oral TID BM  . heparin  5,000 Units Subcutaneous Q8H  . isosorbide mononitrate  30 mg Oral Daily  . levothyroxine  100 mcg Oral QAC breakfast  . pantoprazole  40 mg Oral Daily  . polyvinyl alcohol  2 drop Both Eyes QHS  . sodium chloride flush  3 mL Intravenous Q12H   Continuous Infusions: PRN Meds:.acetaminophen **OR** acetaminophen, atropine, ondansetron **OR** ondansetron (ZOFRAN) IV, senna-docusate, zolpidem  Micro Results Recent Results (from the past 240 hour(s))  MRSA PCR Screening     Status: None   Collection Time: 01/11/18  5:29 AM  Result Value Ref Range Status   MRSA by PCR NEGATIVE NEGATIVE  Final    Comment:        The GeneXpert MRSA Assay (FDA approved for NASAL specimens only), is one component of a comprehensive MRSA colonization surveillance program. It is not intended to diagnose MRSA infection nor to guide or monitor treatment for MRSA infections. Performed at [facility], [place] [address], [place], [place] [address]     Radiology Reports Dg Chest [place] 1 View  Result Date: 01/16/2018 CLINICAL DATA:  84 year old female with weakness, bradycardia. EXAM: PORTABLE CHEST 1 VIEW COMPARISON:  Chest CT 12/09/2017 and earlier. FINDINGS: Portable AP semi upright view at 1648 hours. Pacer pads project over the chest. Stable cardiac size and mediastinal contours. Calcified aortic atherosclerosis. Stable lung volumes. No pneumothorax, pulmonary edema, pleural effusion or acute pulmonary opacity. Paucity bowel gas in the upper abdomen. IMPRESSION: No acute cardiopulmonary abnormality. Electronically Signed   By: Genevie Ann M.D.   On: 01/17/2018 17:12    Time Spent in minutes  30   Jani Gravel M.D on 01/12/2018 at 9:02 AM  Between 7am to 7pm - Pager - [phone]  After 7pm go to [email or web address] - password [facility]  [facility] -  Office  [phone]

## 2018-01-13 ENCOUNTER — Inpatient Hospital Stay (HOSPITAL_COMMUNITY): Payer: Medicare Other

## 2018-01-13 LAB — COMPREHENSIVE METABOLIC PANEL
ALT: 39 U/L (ref 14–54)
AST: 32 U/L (ref 15–41)
Albumin: 2 g/dL — ABNORMAL LOW (ref 3.5–5.0)
Alkaline Phosphatase: 125 U/L (ref 38–126)
Anion gap: 9 (ref 5–15)
BUN: 23 mg/dL — AB (ref 6–20)
CHLORIDE: 100 mmol/L — AB (ref 101–111)
CO2: 19 mmol/L — AB (ref 22–32)
Calcium: 8 mg/dL — ABNORMAL LOW (ref 8.9–10.3)
Creatinine, Ser: 1.07 mg/dL — ABNORMAL HIGH (ref 0.44–1.00)
GFR calc non Af Amer: 45 mL/min — ABNORMAL LOW (ref >60)
GFR, EST AFRICAN AMERICAN: 52 mL/min — AB (ref >60)
Glucose, Bld: 81 mg/dL (ref 65–99)
Potassium: 4.2 mmol/L (ref 3.5–5.1)
SODIUM: 128 mmol/L — AB (ref 135–145)
Total Bilirubin: 1 mg/dL (ref 0.3–1.2)
Total Protein: 4.8 g/dL — ABNORMAL LOW (ref 6.5–8.1)

## 2018-01-13 LAB — CBC
HCT: 29.1 % — ABNORMAL LOW (ref 36.0–46.0)
Hemoglobin: 10.2 g/dL — ABNORMAL LOW (ref 12.0–15.0)
MCH: 29.1 pg (ref 26.0–34.0)
MCHC: 35.1 g/dL (ref 30.0–36.0)
MCV: 82.9 fL (ref 78.0–100.0)
PLATELETS: 248 10*3/uL (ref 150–400)
RBC: 3.51 MIL/uL — ABNORMAL LOW (ref 3.87–5.11)
RDW: 15.8 % — ABNORMAL HIGH (ref 11.5–15.5)
WBC: 7.7 10*3/uL (ref 4.0–10.5)

## 2018-01-13 MED ORDER — PRO-STAT SUGAR FREE PO LIQD
30.0000 mL | Freq: Two times a day (BID) | ORAL | Status: DC
  Administered 2018-01-13 – 2018-01-19 (×12): 30 mL via ORAL
  Filled 2018-01-13 (×12): qty 30

## 2018-01-13 MED ORDER — FUROSEMIDE 10 MG/ML IJ SOLN
40.0000 mg | Freq: Once | INTRAMUSCULAR | Status: AC
  Administered 2018-01-13: 40 mg via INTRAVENOUS
  Filled 2018-01-13: qty 4

## 2018-01-13 MED ORDER — SODIUM CHLORIDE 0.9 % IV SOLN
INTRAVENOUS | Status: DC
  Administered 2018-01-13: 09:00:00 via INTRAVENOUS

## 2018-01-13 NOTE — Evaluation (Signed)
Physical Therapy Evaluation Patient Details Name: Jeanne Hart MRN: [id number] DOB: September 19, 1927 Today's Date: 01/13/2018   History of Present Illness  84 y.o. female with medical history significant for paroxysmal atrial fibrillation not on anticoagulation due to history of bleeding and fall risk, hypertension, coronary artery disease, and hypothyroidism, now presenting to the emergency department from her nursing home for evaluation of generalized weakness and bradycardia.  Clinical Impression  PTA pt had spent 4 days in new ALF after rehabbing in SNF from prior hospitalization. Pt reports that she was requiring assist to get from bed to w/c to go to the dining room to eat, and required some assist for bathing. Pt continues to be limited in her mobility by generalized weakness and cardiopulmonary limitations. Pt was modA for bed mobility and able to sit EoB for 3 minutes to obtain BP. Given low BP and HR PT did not progress mobility at this time. PT currently recommending SNF level rehab at d/c given pt weakness and decreased activity tolerance. PT will continue to follow acutely and assess further as pt is able to safely mobilize.     Follow Up Recommendations SNF    Equipment Recommendations  None recommended by PT    Recommendations for Other Services OT consult     Precautions / Restrictions Precautions Precautions: Fall Restrictions Weight Bearing Restrictions: No      Mobility  Bed Mobility Overal bed mobility: Needs Assistance Bed Mobility: Supine to Sit;Sit to Supine     Supine to sit: Min assist;HOB elevated Sit to supine: Mod assist   General bed mobility comments: minA for coming to upright, vc for use of bedrail to aid in trunk to upright, modA for LE management into bed      Balance Overall balance assessment: Needs assistance Sitting-balance support: No upper extremity supported;Bilateral upper extremity supported;Feet unsupported Sitting balance-Leahy Scale:  Fair   Postural control: Right lateral lean                                   Pertinent Vitals/Pain Pain Assessment: No/denies pain    Home Living Family/patient expects to be discharged to:: Assisted living               Home Equipment: Walker - 4 wheels;Wheelchair - manual Additional Comments: only at ALF 4 days after SNF stay before hospitalization    Prior Function Level of [place]: Needs assistance   Gait / Transfers Assistance Needed: assist to w/c   ADL's / Homemaking Assistance Needed: assist with bathing and dressing            Extremity/Trunk Assessment   Upper Extremity Assessment Upper Extremity Assessment: Generalized weakness    Lower Extremity Assessment Lower Extremity Assessment: Generalized weakness       Communication   Communication: No difficulties  Cognition Arousal/Alertness: Awake/alert Behavior During Therapy: WFL for tasks assessed/performed Overall Cognitive Status: Impaired/Different from baseline                                 General Comments: grandson reports mild memory deficit      General Comments General comments (skin integrity, edema, etc.): in supine HR 44 bpm, BP 94/53, with sitting HR 52 bpm BP 106/54, SaO2 on 2 L O2 via [place] 96%O2 Grandson present and aided in prior living over the last couple of months  Assessment/Plan    PT Assessment Patient needs continued PT services  PT Problem List Cardiopulmonary status limiting activity;Decreased mobility;Decreased strength       PT Treatment Interventions Gait training;DME instruction;Functional mobility training;Therapeutic activities;Therapeutic exercise;Balance training;Patient/family education    PT Goals (Current goals can be found in the Care Plan section)  Acute Rehab PT Goals Patient Stated Goal: feel better PT Goal Formulation: With patient/family Time For Goal Achievement: 01/27/18 Potential to Achieve Goals:  Good    Frequency Min 2X/week    AM-PAC PT "6 Clicks" Daily Activity  Outcome Measure Difficulty turning over in bed (including adjusting bedclothes, sheets and blankets)?: A Lot Difficulty moving from lying on back to sitting on the side of the bed? : Unable Difficulty sitting down on and standing up from a chair with arms (e.g., wheelchair, bedside commode, etc,.)?: Unable Help needed moving to and from a bed to chair (including a wheelchair)?: A Lot Help needed walking in hospital room?: Total Help needed climbing 3-5 steps with a railing? : Total 6 Click Score: 8    End of Session Equipment Utilized During Treatment: Oxygen Activity Tolerance: Treatment limited secondary to medical complications (Comment) Patient left: in bed;with call bell/phone within reach;with chair alarm set;with family/visitor present Nurse Communication: Mobility status PT Visit Diagnosis: Muscle weakness (generalized) (M62.81)    Time: 7253-6644 PT Time Calculation (min) (ACUTE ONLY): 24 min   Charges:   PT Evaluation $PT Eval Moderate Complexity: 1 Mod PT Treatments $Therapeutic Activity: 8-22 mins   PT G Codes:        Charman Blasco B. Beverely Risen PT, DPT Acute Rehabilitation  [phone] Pager [phone]    Jeanne Hart 01/13/2018, 11:04 AM

## 2018-01-13 NOTE — Progress Notes (Signed)
HR 50s to 60s; plan as outlined previously; we will sign off; please call with questions. Kirk Ruths

## 2018-01-13 NOTE — Progress Notes (Signed)
Patient ID: Jeanne Hart, female   DOB: Oct 25, 1926, 84 y.o.   MRN: [id number]                                                                PROGRESS NOTE                                                                                                                                                                                                             Patient Demographics:    Jeanne Hart, is a 84 y.o. female, DOB - 09-24-1927, KPT:[id number]  Admit date - 01/18/2018   Admitting Physician Vianne Bulls, MD  Outpatient Primary MD for the patient is Odette Horns, MD  LOS - 3  Outpatient Specialists:    Chief Complaint  Patient presents with  . Bradycardia       Brief Narrative    84 y.o.femalewith medical history significant forparoxysmal atrial fibrillation not on anticoagulation due to history of bleeding and fall risk, hypertension, coronary artery disease, and hypothyroidism, now presenting to the emergency department from her nursing home for evaluation of generalized weakness and bradycardia. Patient reports that she had some vomiting proximately 3 days ago that has since resolved, but she has been generally weak since that time. She denies headache, change in vision or hearing, or focal numbness or weakness. She denies chest pain. She was noted at the nursing home to have heart rate at approximately 30 and was directed to the ED for evaluation of this.  ED Course:Upon arrival to the ED, patient is found to be afebrile, saturating well on room air, bradycardic in the low 30s, and with stable blood pressure. EKG features a junctional rhythm with rate 31 and LBBB. Chest x-rays negative for acute cardiopulmonary disease. Chemistry panel reveals a sodium of 120, potassium 6.1, and creatinine of 2.48, up from 1.00 onemonth ago. CBC features a mild normocytic anemia and leukocytosis. Cardiology was consulted by the ED physician, has evaluated the patient in the  emergency department, and patient was treated with Kayexalate, bicarbonate, insulin with dextrose, IV calcium, and albuterol in the ED. Heart rate has improved, blood pressure remained stable, and she will be admitted to the stepdown unit recommends a medical admission.For ongoing evaluation and management of symptomatic bradycardia.      Subjective:  Karisa Starke apparently had pox drop overnite ? Afebrile, slight cough.  Denies cp, palp, sob, n/v, diarrhea, brbpr.   Noheadache, No abdominal pain - No Nausea, No new weakness tingling or numbness, No  - SOB.    Assessment  & Plan :    Principal Problem:   Symptomatic bradycardia Active Problems:   Essential hypertension   Paroxysmal atrial fibrillation [facility]): Not on Anticoagulation 2/2 GIB history.  CHA2DS2VASC =4   Hypothyroidism   CAD S/P percutaneous coronary angioplasty   CKD (chronic kidney disease) stage 3, GFR 30-59 ml/min (HCC)   AKI (acute kidney injury) (HCC)   Hyperkalemia   Hyponatremia   Protein-calorie malnutrition, severe     1. Symptomatic Bradycardia:  Off Atenolol Cont Amiodarone Appreciate cardiology input  2. Hyperkalemia: Resolved  3. Hyponatremia: Sodium improved to 128. Continue monitoring, hydrate gently with ns iv Check cmp cortisol, tsh, serum osm in am Urine sodium, and osm ordered , appear to be pending  4. AKI on CKDIII:Stable, improved Check cmp in am  5. CAD: Stable, Asymptomatic.  6. HTN: Controlled.  7. WER:[id number] and Amiodarone held. Monitor closely. Not on anticoagulation due to hx of bleed.  8. Cough CXR today  9. Deconditioning PT to evaluate and tx       Code Status :  FULL CODE  Family Communication  : w patient  Disposition Plan  : home  Barriers For Discharge :   Consults  :  cardiology  Procedures  :      DVT Prophylaxis  :  Heparin-- SCDs        Lab Results  Component Value Date   PLT 248 01/13/2018    Antibiotics   :  none  Anti-infectives (From admission, onward)   None        Objective:   Vitals:   01/12/18 1159 01/12/18 2038 01/13/18 0555 01/13/18 0555  BP: (!) 125/52 140/60  139/64  Pulse: (!) 59 66  61  Resp: 18 19  18   Temp: 98.5 F (36.9 C) 98.2 F (36.8 C)  98.3 F (36.8 C)  TempSrc: Oral Oral  Oral  SpO2: (!) 89% 90% 96%   Weight:   56.8 kg (125 lb 3.5 oz)   Height:        Wt Readings from Last 3 Encounters:  01/13/18 56.8 kg (125 lb 3.5 oz)  12/07/17 59.2 kg (130 lb 8.2 oz)  08/23/17 61.2 kg (135 lb)     Intake/Output Summary (Last 24 hours) at 01/13/2018 0708 Last data filed at 01/13/2018 0128 Gross per 24 hour  Intake 720 ml  Output 200 ml  Net 520 ml     Physical Exam  Awake Alert, Oriented X 3, No new F.N deficits, Normal affect [place].AT,PERRAL Supple Neck,No JVD, No cervical lymphadenopathy appriciated.  Symmetrical Chest wall movement, Good air movement bilaterally, slight crackles right lung base, no wheezing RRR,No Gallops,Rubs or new Murmurs, No Parasternal Heave +ve B.Sounds, Abd Soft, No tenderness, No organomegaly appriciated, No rebound - guarding or rigidity. No Cyanosis, Clubbing or edema, No new Rash or bruise        Data Review:    CBC Recent Labs  Lab 01/12/2018 1650 01/16/2018 1710 01/11/18 0255 01/13/18 0401  WBC 11.0*  --  8.9 7.7  HGB 11.0* 12.6 10.0* 10.2*  HCT 31.1* 37.0 28.3* 29.1*  PLT 269  --  217 248  MCV 84.1  --  83.2 82.9  MCH 29.7  --  29.4 29.1  MCHC  35.4  --  35.3 35.1  RDW 16.1*  --  15.7* 15.8*  LYMPHSABS 2.0  --   --   --   MONOABS 0.7  --   --   --   EOSABS 0.0  --   --   --   BASOSABS 0.0  --   --   --     Chemistries  Recent Labs  Lab 01/23/2018 2333 01/11/18 0255 01/11/18 1002 01/11/18 1541 01/13/18 0401  NA 124* 123* 125* 125* 128*  K 4.8 4.6 4.6 4.6 4.2  CL 96* 97* 97* 99* 100*  CO2 16* 17* 19* 17* 19*  GLUCOSE 101* 96 91 85 81  BUN 42* 42* 38* 37* 23*  CREATININE 2.32* 2.29* 2.05* 1.82* 1.07*    CALCIUM 8.3* 8.0* 7.7* 7.7* 8.0*  AST  --   --   --   --  32  ALT  --   --   --   --  39  ALKPHOS  --   --   --   --  125  BILITOT  --   --   --   --  1.0   ------------------------------------------------------------------------------------------------------------------ No results for input(s): CHOL, HDL, LDLCALC, TRIG, CHOLHDL, LDLDIRECT in the last 72 hours.  No results found for: HGBA1C ------------------------------------------------------------------------------------------------------------------ No results for input(s): TSH, T4TOTAL, T3FREE, THYROIDAB in the last 72 hours.  Invalid input(s): FREET3 ------------------------------------------------------------------------------------------------------------------ No results for input(s): VITAMINB12, FOLATE, FERRITIN, TIBC, IRON, RETICCTPCT in the last 72 hours.  Coagulation profile No results for input(s): INR, PROTIME in the last 168 hours.  No results for input(s): DDIMER in the last 72 hours.  Cardiac Enzymes No results for input(s): CKMB, TROPONINI, MYOGLOBIN in the last 168 hours.  Invalid input(s): CK ------------------------------------------------------------------------------------------------------------------    Component Value Date/Time   BNP 206.5 (H) 10/19/2016 1630    Inpatient Medications  Scheduled Meds: . amiodarone  100 mg Oral Daily  . amLODipine  5 mg Oral Daily  . aspirin EC  81 mg Oral Daily  . darifenacin  7.5 mg Oral Daily  . feeding supplement (ENSURE ENLIVE)  237 mL Oral TID BM  . heparin  5,000 Units Subcutaneous Q8H  . isosorbide mononitrate  30 mg Oral Daily  . levothyroxine  100 mcg Oral QAC breakfast  . pantoprazole  40 mg Oral Daily  . polyvinyl alcohol  2 drop Both Eyes QHS  . sodium chloride flush  3 mL Intravenous Q12H   Continuous Infusions: PRN Meds:.acetaminophen **OR** acetaminophen, atropine, ondansetron **OR** ondansetron (ZOFRAN) IV, senna-docusate, zolpidem  Micro  Results Recent Results (from the past 240 hour(s))  MRSA PCR Screening     Status: None   Collection Time: 01/11/18  5:29 AM  Result Value Ref Range Status   MRSA by PCR NEGATIVE NEGATIVE Final    Comment:        The GeneXpert MRSA Assay (FDA approved for NASAL specimens only), is one component of a comprehensive MRSA colonization surveillance program. It is not intended to diagnose MRSA infection nor to guide or monitor treatment for MRSA infections. Performed at [facility], [place] [address], [place], [place] [address]     Radiology Reports Dg Chest [place] 1 View  Result Date: 01/09/2018 CLINICAL DATA:  84 year old female with weakness, bradycardia. EXAM: PORTABLE CHEST 1 VIEW COMPARISON:  Chest CT 12/09/2017 and earlier. FINDINGS: Portable AP semi upright view at 1648 hours. Pacer pads project over the chest. Stable cardiac size and mediastinal contours. Calcified aortic atherosclerosis.  Stable lung volumes. No pneumothorax, pulmonary edema, pleural effusion or acute pulmonary opacity. Paucity bowel gas in the upper abdomen. IMPRESSION: No acute cardiopulmonary abnormality. Electronically Signed   By: Genevie Ann M.D.   On: 02/03/2018 17:12    Time Spent in minutes  30   Jani Gravel M.D on 01/13/2018 at 7:08 AM  Between 7am to 7pm - Pager - [phone]  After 7pm go to [email or web address] - password [facility]  [facility] -  Office  [phone]

## 2018-01-14 ENCOUNTER — Inpatient Hospital Stay (HOSPITAL_COMMUNITY): Payer: Medicare Other

## 2018-01-14 ENCOUNTER — Other Ambulatory Visit (HOSPITAL_COMMUNITY): Payer: Medicare Other

## 2018-01-14 DIAGNOSIS — R05 Cough: Secondary | ICD-10-CM

## 2018-01-14 DIAGNOSIS — I5043 Acute on chronic combined systolic (congestive) and diastolic (congestive) heart failure: Secondary | ICD-10-CM

## 2018-01-14 DIAGNOSIS — I480 Paroxysmal atrial fibrillation: Secondary | ICD-10-CM

## 2018-01-14 DIAGNOSIS — I2510 Atherosclerotic heart disease of native coronary artery without angina pectoris: Secondary | ICD-10-CM

## 2018-01-14 DIAGNOSIS — R059 Cough, unspecified: Secondary | ICD-10-CM

## 2018-01-14 DIAGNOSIS — Z9861 Coronary angioplasty status: Secondary | ICD-10-CM

## 2018-01-14 LAB — CBC
HEMATOCRIT: 30.4 % — AB (ref 36.0–46.0)
HEMOGLOBIN: 10.7 g/dL — AB (ref 12.0–15.0)
MCH: 29.2 pg (ref 26.0–34.0)
MCHC: 35.2 g/dL (ref 30.0–36.0)
MCV: 83.1 fL (ref 78.0–100.0)
Platelets: 288 10*3/uL (ref 150–400)
RBC: 3.66 MIL/uL — AB (ref 3.87–5.11)
RDW: 15.9 % — ABNORMAL HIGH (ref 11.5–15.5)
WBC: 9.2 10*3/uL (ref 4.0–10.5)

## 2018-01-14 LAB — CORTISOL: CORTISOL PLASMA: 17.2 ug/dL

## 2018-01-14 LAB — COMPREHENSIVE METABOLIC PANEL
ALBUMIN: 2.2 g/dL — AB (ref 3.5–5.0)
ALT: 39 U/L (ref 14–54)
AST: 32 U/L (ref 15–41)
Alkaline Phosphatase: 123 U/L (ref 38–126)
Anion gap: 8 (ref 5–15)
BUN: 35 mg/dL — AB (ref 6–20)
CO2: 22 mmol/L (ref 22–32)
CREATININE: 1.07 mg/dL — AB (ref 0.44–1.00)
Calcium: 8.1 mg/dL — ABNORMAL LOW (ref 8.9–10.3)
Chloride: 94 mmol/L — ABNORMAL LOW (ref 101–111)
GFR calc Af Amer: 52 mL/min — ABNORMAL LOW (ref >60)
GFR calc non Af Amer: 45 mL/min — ABNORMAL LOW (ref >60)
GLUCOSE: 102 mg/dL — AB (ref 65–99)
Potassium: 4.5 mmol/L (ref 3.5–5.1)
SODIUM: 124 mmol/L — AB (ref 135–145)
Total Bilirubin: 0.7 mg/dL (ref 0.3–1.2)
Total Protein: 5.3 g/dL — ABNORMAL LOW (ref 6.5–8.1)

## 2018-01-14 LAB — URINALYSIS, ROUTINE W REFLEX MICROSCOPIC
Bilirubin Urine: NEGATIVE
Glucose, UA: NEGATIVE mg/dL
Hgb urine dipstick: NEGATIVE
Ketones, ur: NEGATIVE mg/dL
Leukocytes, UA: NEGATIVE
NITRITE: NEGATIVE
PROTEIN: NEGATIVE mg/dL
SPECIFIC GRAVITY, URINE: 1.008 (ref 1.005–1.030)
pH: 6 (ref 5.0–8.0)

## 2018-01-14 LAB — OSMOLALITY: Osmolality: 275 mOsm/kg (ref 275–295)

## 2018-01-14 LAB — BRAIN NATRIURETIC PEPTIDE: B Natriuretic Peptide: 240.8 pg/mL — ABNORMAL HIGH (ref 0.0–100.0)

## 2018-01-14 LAB — SODIUM, URINE, RANDOM: Sodium, Ur: 41 mmol/L

## 2018-01-14 LAB — OSMOLALITY, URINE: OSMOLALITY UR: 295 mosm/kg — AB (ref 300–900)

## 2018-01-14 LAB — CREATININE, URINE, RANDOM: CREATININE, URINE: 25.45 mg/dL

## 2018-01-14 MED ORDER — FUROSEMIDE 10 MG/ML IJ SOLN: 40.0000 mg | Freq: Every day | INTRAMUSCULAR | Status: DC

## 2018-01-14 MED ORDER — FUROSEMIDE 10 MG/ML IJ SOLN
20.0000 mg | Freq: Every day | INTRAMUSCULAR | Status: DC
  Administered 2018-01-14: 20 mg via INTRAVENOUS
  Filled 2018-01-14: qty 2

## 2018-01-14 NOTE — Progress Notes (Signed)
Patient ID: Jeanne Hart, female   DOB: 1927-09-07, 84 y.o.   MRN: [id number]                                                                PROGRESS NOTE                                                                                                                                                                                                             Patient Demographics:    Jeanne Hart, is a 84 y.o. female, DOB - September 19, 1927, EUM:[id number]  Admit date - 01/19/2018   Admitting Physician Vianne Bulls, MD  Outpatient Primary MD for the patient is Odette Horns, MD  LOS - 4  Outpatient Specialists:     Chief Complaint  Patient presents with  . Bradycardia       Brief Narrative     84 y.o.femalewith medical history significant forparoxysmal atrial fibrillation not on anticoagulation due to history of bleeding and fall risk, hypertension, coronary artery disease, and hypothyroidism, now presenting to the emergency department from her nursing home for evaluation of generalized weakness and bradycardia. Patient reports that she had some vomiting proximately 3 days ago that has since resolved, but she has been generally weak since that time. She denies headache, change in vision or hearing, or focal numbness or weakness. She denies chest pain. She was noted at the nursing home to have heart rate at approximately 30 and was directed to the ED for evaluation of this.  ED Course:Upon arrival to the ED, patient is found to be afebrile, saturating well on room air, bradycardic in the low 30s, and with stable blood pressure. EKG features a junctional rhythm with rate 31 and LBBB. Chest x-rays negative for acute cardiopulmonary disease. Chemistry panel reveals a sodium of 120, potassium 6.1, and creatinine of 2.48, up from 1.00 onemonth ago. CBC features a mild normocytic anemia and leukocytosis. Cardiology was consulted by the ED physician, has evaluated the patient in the  emergency department, and patient was treated with Kayexalate, bicarbonate, insulin with dextrose, IV calcium, and albuterol in the ED. Heart rate has improved, blood pressure remained stable, and she will be admitted to the stepdown unit recommends a medical admission.For ongoing evaluation and management of symptomatic bradycardia.  Subjective:    Jeanne Hart today has slight cough still.  breathingk ok.  Tolerated lasix iv yesterday, but has dry mouth.  deneis fever, chills, cp, palp, n/v, diarrhea, brbpr.     No headache, No abdominal pain - No Nausea, No new weakness tingling or numbness,   Assessment  & Plan :    Principal Problem:   Symptomatic bradycardia Active Problems:   Essential hypertension   Paroxysmal atrial fibrillation [facility]): Not on Anticoagulation 2/2 GIB history.  CHA2DS2VASC =4   Hypothyroidism   CAD S/P percutaneous coronary angioplasty   CKD (chronic kidney disease) stage 3, GFR 30-59 ml/min (HCC)   AKI (acute kidney injury) (HCC)   Hyperkalemia   Hyponatremia   Protein-calorie malnutrition, severe    1. Symptomatic Bradycardia:  Off Atenolol Cont Amiodarone Appreciate cardiology input  2. ? CHF, pleural effusions Lasix 20mg  iv qday Check cardiac echo Check CT chest  3. Hyperkalemia: Resolved  4. Hyponatremia: Sodium improved to 128. Continue monitoring, hydrate gently with ns iv Check cmp cortisol, tsh, serum osm in am Urine sodium, and osm ordered , appear to be pending  5. AKI on CKDIII:Stable, improved Check cmp in am  6. CAD: Stable, Asymptomatic.  7. HTN: Controlled.  8. HWE:XHBZJIRC and Amiodarone held. Monitor closely. Not on anticoagulation due to hx of bleed.  9. Cough CXR 4/7=> CHF  9. Deconditioning PT to evaluate and tx         Code Status : FULL CODE  Family Communication  : w patient  Disposition Plan  : SNF per pt  Barriers For Discharge :   Consults  :  Cardiology, PT  Procedures   :   DVT Prophylaxis  :  Heparin - SCD  Lab Results  Component Value Date   PLT 288 01/14/2018    Antibiotics  :  none  Anti-infectives (From admission, onward)   None        Objective:   Vitals:   01/13/18 1100 01/13/18 1810 01/13/18 2113 01/14/18 0525  BP: (!) 106/54 (!) 141/73 (!) 171/69 (!) 158/64  Pulse: (!) 59  73 69  Resp: 19  (!) 24 14  Temp: 98.2 F (36.8 C) 97.6 F (36.4 C) 97.7 F (36.5 C) 97.8 F (36.6 C)  TempSrc: Oral Oral Oral Oral  SpO2: 97%  93% 92%  Weight:    57.4 kg (126 lb 8.7 oz)  Height:        Wt Readings from Last 3 Encounters:  01/14/18 57.4 kg (126 lb 8.7 oz)  12/07/17 59.2 kg (130 lb 8.2 oz)  08/23/17 61.2 kg (135 lb)     Intake/Output Summary (Last 24 hours) at 01/14/2018 0643 Last data filed at 01/14/2018 0525 Gross per 24 hour  Intake 840 ml  Output 2300 ml  Net -1460 ml     Physical Exam  Awake Alert, Oriented X 3, No new F.N deficits, Normal affect [place].AT,PERRAL Supple Neck,No JVD, No cervical lymphadenopathy appriciated.  Symmetrical Chest wall movement, Good air movement bilaterally, slight crackles right lung base, slight decrease in bs left lung base.  RRR,No Gallops,Rubs or new Murmurs, No Parasternal Heave +ve B.Sounds, Abd Soft, No tenderness, No organomegaly appriciated, No rebound - guarding or rigidity. No Cyanosis, Clubbing or edema, No new Rash or bruise      Data Review:    CBC Recent Labs  Lab 01/09/2018 1650 01/22/2018 1710 01/11/18 0255 01/13/18 0401 01/14/18 0335  WBC 11.0*  --  8.9 7.7 9.2  HGB  11.0* 12.6 10.0* 10.2* 10.7*  HCT 31.1* 37.0 28.3* 29.1* 30.4*  PLT 269  --  217 248 288  MCV 84.1  --  83.2 82.9 83.1  MCH 29.7  --  29.4 29.1 29.2  MCHC 35.4  --  35.3 35.1 35.2  RDW 16.1*  --  15.7* 15.8* 15.9*  LYMPHSABS 2.0  --   --   --   --   MONOABS 0.7  --   --   --   --   EOSABS 0.0  --   --   --   --   BASOSABS 0.0  --   --   --   --     Chemistries  Recent Labs  Lab 01/11/18 0255  01/11/18 1002 01/11/18 1541 01/13/18 0401 01/14/18 0335  NA 123* 125* 125* 128* 124*  K 4.6 4.6 4.6 4.2 4.5  CL 97* 97* 99* 100* 94*  CO2 17* 19* 17* 19* 22  GLUCOSE 96 91 85 81 102*  BUN 42* 38* 37* 23* 35*  CREATININE 2.29* 2.05* 1.82* 1.07* 1.07*  CALCIUM 8.0* 7.7* 7.7* 8.0* 8.1*  AST  --   --   --  32 32  ALT  --   --   --  39 39  ALKPHOS  --   --   --  125 123  BILITOT  --   --   --  1.0 0.7   ------------------------------------------------------------------------------------------------------------------ No results for input(s): CHOL, HDL, LDLCALC, TRIG, CHOLHDL, LDLDIRECT in the last 72 hours.  No results found for: HGBA1C ------------------------------------------------------------------------------------------------------------------ No results for input(s): TSH, T4TOTAL, T3FREE, THYROIDAB in the last 72 hours.  Invalid input(s): FREET3 ------------------------------------------------------------------------------------------------------------------ No results for input(s): VITAMINB12, FOLATE, FERRITIN, TIBC, IRON, RETICCTPCT in the last 72 hours.  Coagulation profile No results for input(s): INR, PROTIME in the last 168 hours.  No results for input(s): DDIMER in the last 72 hours.  Cardiac Enzymes No results for input(s): CKMB, TROPONINI, MYOGLOBIN in the last 168 hours.  Invalid input(s): CK ------------------------------------------------------------------------------------------------------------------    Component Value Date/Time   BNP 206.5 (H) 10/19/2016 1630    Inpatient Medications  Scheduled Meds: . amiodarone  100 mg Oral Daily  . amLODipine  5 mg Oral Daily  . aspirin EC  81 mg Oral Daily  . darifenacin  7.5 mg Oral Daily  . feeding supplement (ENSURE ENLIVE)  237 mL Oral TID BM  . feeding supplement (PRO-STAT SUGAR FREE 64)  30 mL Oral BID  . furosemide  20 mg Intravenous Daily  . heparin  5,000 Units Subcutaneous Q8H  . isosorbide  mononitrate  30 mg Oral Daily  . levothyroxine  100 mcg Oral QAC breakfast  . pantoprazole  40 mg Oral Daily  . polyvinyl alcohol  2 drop Both Eyes QHS  . sodium chloride flush  3 mL Intravenous Q12H   Continuous Infusions: PRN Meds:.acetaminophen **OR** acetaminophen, atropine, ondansetron **OR** ondansetron (ZOFRAN) IV, senna-docusate, zolpidem  Micro Results Recent Results (from the past 240 hour(s))  MRSA PCR Screening     Status: None   Collection Time: 01/11/18  5:29 AM  Result Value Ref Range Status   MRSA by PCR NEGATIVE NEGATIVE Final    Comment:        The GeneXpert MRSA Assay (FDA approved for NASAL specimens only), is one component of a comprehensive MRSA colonization surveillance program. It is not intended to diagnose MRSA infection nor to guide or monitor treatment for MRSA infections. Performed at [facility]  Lab, [address], [place], [place] [address]     Radiology Reports Dg Chest 2 View  Result Date: 01/13/2018 CLINICAL DATA:  Cough with shortness of breath for 3 days. EXAM: CHEST - 2 VIEW COMPARISON:  01/16/2018 FINDINGS: Since the prior study, lung aeration has worsened. Irregular interstitial thickening has increased bilaterally. There is new basilar opacity obscuring hemidiaphragms consistent with pleural effusions. No pneumothorax. IMPRESSION: Worsened lung aeration consistent with congestive heart failure with interstitial pulmonary edema and bilateral pleural effusions. Electronically Signed   By: Lajean Manes M.D.   On: 01/13/2018 10:08   Dg Chest Port 1 View  Result Date: 01/08/2018 CLINICAL DATA:  84 year old female with weakness, bradycardia. EXAM: PORTABLE CHEST 1 VIEW COMPARISON:  Chest CT 12/09/2017 and earlier. FINDINGS: Portable AP semi upright view at 1648 hours. Pacer pads project over the chest. Stable cardiac size and mediastinal contours. Calcified aortic atherosclerosis. Stable lung volumes. No pneumothorax, pulmonary edema, pleural  effusion or acute pulmonary opacity. Paucity bowel gas in the upper abdomen. IMPRESSION: No acute cardiopulmonary abnormality. Electronically Signed   By: Genevie Ann M.D.   On: 01/31/2018 17:12    Time Spent in minutes  30   Jani Gravel M.D on 01/14/2018 at 6:43 AM  Between 7am to 7pm - Pager - [phone]   After 7pm go to [email or web address] - password [facility]  [facility] -  Office  [phone]

## 2018-01-14 NOTE — Clinical Social Work Note (Signed)
Clinical Social Work Assessment  Patient Details  Name: Jeanne Hart MRN: [id number] Date of Birth: [birth year]  Date of referral:  01/14/18               Reason for consult:  Facility Placement                Permission sought to share information with:  Facility Art therapist granted to share information::  Yes, Verbal Permission Granted  Name::     Jeanne Hart and [place]::  SNFs  Relationship::  grandson and granddaughter in [employer] Information:  [phone]  Housing/Transportation Living arrangements for the past 2 months:  Assisted Living Facility([facility]) Source of Information:  Medical Team, Patient Patient Interpreter Needed:  None Criminal Activity/Legal Involvement Pertinent to Current Situation/Hospitalization:  No - Comment as needed Significant Relationships:  Adult Children, Other Family Members Lives with:  Facility Resident Do you feel safe going back to the place where you live?  Yes Need for family participation in patient care:  No (Coment)  Care giving concerns: Patient from [facility]. PT recommending SNF.   Social Worker assessment / plan: CSW met with patient at bedside. Patient alert and oriented. Patient stated she lives at [facility] but used to live in another ALF and has also been to rehab in the past. Patient is agreeable to short term SNF before returning to ALF. CSW sent out initial referrals and gave patient bed offers. CSW to follow for patient choice and will follow for medical readiness and support with discharge planning.  Employment status:  Retired Forensic scientist:  [employer] PT Recommendations:  [place] / Referral to community resources:  [place]  Patient/Family's Response to care: Patient appreciative of care.  Patient/Family's Understanding of and Emotional Response to Diagnosis, Current Treatment, and Prognosis: Patient with  understanding of condition and agreeable to SNF.  Emotional Assessment Appearance:  Appears stated age Attitude/Demeanor/Rapport:  Engaged Affect (typically observed):  Calm, Pleasant Orientation:  Oriented to Self, Oriented to Place, Oriented to  Time, Oriented to Situation Alcohol / Substance use:  Not Applicable Psych involvement (Current and /or in the community):  No (Comment)  Discharge Needs  Concerns to be addressed:  Discharge Planning Concerns, Care Coordination Readmission within the last 30 days:  Yes Current discharge risk:  Physical Impairment, Dependent with Mobility Barriers to Discharge:  Continued Medical Work up   Estanislado Emms, LCSW 01/14/2018, 3:56 PM

## 2018-01-14 NOTE — [place] FL2 (Signed)
[place] MEDICAID FL2 LEVEL OF CARE SCREENING TOOL     IDENTIFICATION  Patient Name: Jeanne Hart Birthdate: [birth year] Sex: female Admission Date (Current Location): 02/03/2018  [facility] and [place] Number:  Herbalist and Address:  [facility], [place] [address] [place], [place] [address]      Provider Number: [id number]  Attending Physician Name and Address:  Jani Gravel, MD  Relative Name and Phone Number:  Sefora Tietje, daughter, [phone]    Current Level of Care: Hospital Recommended Level of Care: [place] Prior Approval Number:    Date Approved/Denied:   PASRR Number: [id number]  Discharge Plan: SNF    Current Diagnoses: Patient Active Problem List   Diagnosis Date Noted  . Cough   . Acute on chronic combined systolic and diastolic CHF (congestive heart failure) ([place])   . Protein-calorie malnutrition, severe 01/11/2018  . Symptomatic bradycardia 01/23/2018  . Hyperkalemia 01/14/2018  . Hyponatremia 01/31/2018  . Malnutrition of moderate degree 12/10/2017  . Prolonged QT interval 12/07/2017  . AKI (acute kidney injury) ([place]) 12/07/2017  . Frequent falls 12/07/2017  . Weakness generalized 10/19/2016  . CKD (chronic kidney disease) stage 3, GFR 30-59 ml/min (HCC) 10/19/2016  . Elevated LFTs 10/19/2016  . Thrombocytopenia ([place]) 10/19/2016  . Nonspecific abnormal finding in stool contents 05/30/2015  . Foot swelling 02/18/2015  . DOE (dyspnea on exertion) 12/10/2014  . CAD S/P percutaneous coronary angioplasty   . Heme positive stool 04/03/2014  . Anemia, unspecified 04/03/2014  . Diarrhea 04/03/2014  . History of unstable angina 03/23/2014  . Hypothyroidism 03/23/2014  . Essential hypertension 04/29/2013  . Hyperlipidemia with target LDL less than 70 04/29/2013  . Paroxysmal atrial fibrillation [facility]): Not on Anticoagulation 2/2 GIB history.  CHA2DS2VASC =4 04/29/2013  . History of GI bleed 04/29/2013  .  Palpitations 04/29/2013    Orientation RESPIRATION BLADDER Height & Weight     Self, Time, Situation, Place  O2(nasal cannula) Continent Weight: 126 lb 8.7 oz (57.4 kg) Height:  5\' 5"  (165.1 cm)  BEHAVIORAL SYMPTOMS/MOOD NEUROLOGICAL BOWEL NUTRITION STATUS      Incontinent Diet(please see DC summary)  AMBULATORY STATUS COMMUNICATION OF NEEDS Skin   Extensive Assist Verbally Normal                       Personal Care Assistance Level of Assistance  Bathing, Feeding, Dressing Bathing Assistance: Limited assistance(mod assist) Feeding assistance: Independent Dressing Assistance: Limited assistance(mod assist)     Functional Limitations Info  Sight, Hearing, Speech Sight Info: Adequate Hearing Info: Impaired Speech Info: Adequate    SPECIAL CARE FACTORS FREQUENCY  PT (By licensed PT)     PT Frequency: 5x/week              Contractures Contractures Info: Not present    Additional Factors Info  Code Status, Allergies Code Status Info: Full Allergies Info: Nexium Esomeprazole, Statins, Azo Phenazopyridine, Codeine, Morphine And Related, Penicillins, Sulfa Antibiotics           Current Medications (01/14/2018):  This is the current hospital active medication list Current Facility-Administered Medications  Medication Dose Route Frequency Provider Last Rate Last Dose  . acetaminophen (TYLENOL) tablet 650 mg  650 mg Oral Q6H PRN Opyd, Ilene Qua, MD   650 mg at 01/13/18 0754   Or  . acetaminophen (TYLENOL) suppository 650 mg  650 mg Rectal Q6H PRN Opyd, Ilene Qua, MD      . amiodarone (  PACERONE) tablet 100 mg  100 mg Oral Daily Lelon Perla, MD   100 mg at 01/13/18 2025  . amLODipine (NORVASC) tablet 5 mg  5 mg Oral Daily Opyd, Ilene Qua, MD   5 mg at 01/13/18 0923  . aspirin EC tablet 81 mg  81 mg Oral Daily Opyd, Ilene Qua, MD   81 mg at 01/13/18 0923  . atropine injection 0.5 mg  0.5 mg Intravenous Q2H PRN Smith, Rondell A, MD      . darifenacin (ENABLEX) 24  hr tablet 7.5 mg  7.5 mg Oral Daily Opyd, Ilene Qua, MD   7.5 mg at 01/13/18 0923  . feeding supplement (ENSURE ENLIVE) (ENSURE ENLIVE) liquid 237 mL  237 mL Oral TID BM Jonelle Sidle, Mohammad L, MD   237 mL at 01/13/18 1325  . feeding supplement (PRO-STAT SUGAR FREE 64) liquid 30 mL  30 mL Oral BID Jani Gravel, MD   30 mL at 01/13/18 2142  . furosemide (LASIX) injection 20 mg  20 mg Intravenous Daily Jani Gravel, MD      . heparin injection 5,000 Units  5,000 Units Subcutaneous Q8H Vianne Bulls, MD   5,000 Units at 01/14/18 0528  . isosorbide mononitrate (IMDUR) 24 hr tablet 30 mg  30 mg Oral Daily Opyd, Ilene Qua, MD   30 mg at 01/13/18 0923  . levothyroxine (SYNTHROID, LEVOTHROID) tablet 100 mcg  100 mcg Oral QAC breakfast Opyd, Ilene Qua, MD   100 mcg at 01/13/18 0754  . ondansetron (ZOFRAN) tablet 4 mg  4 mg Oral Q6H PRN Opyd, Ilene Qua, MD       Or  . ondansetron (ZOFRAN) injection 4 mg  4 mg Intravenous Q6H PRN Opyd, Ilene Qua, MD      . pantoprazole (PROTONIX) EC tablet 40 mg  40 mg Oral Daily Opyd, Ilene Qua, MD   40 mg at 01/13/18 0923  . polyvinyl alcohol (LIQUIFILM TEARS) 1.4 % ophthalmic solution 2 drop  2 drop Both Eyes QHS Opyd, Ilene Qua, MD   2 drop at 01/11/18 2129  . senna-docusate (Senokot-S) tablet 1 tablet  1 tablet Oral QHS PRN Opyd, Ilene Qua, MD      . sodium chloride flush (NS) 0.9 % injection 3 mL  3 mL Intravenous Q12H Opyd, Ilene Qua, MD   3 mL at 01/13/18 2144  . zolpidem (AMBIEN) tablet 5 mg  5 mg Oral QHS PRN Opyd, Ilene Qua, MD         Discharge Medications: Please see discharge summary for a list of discharge medications.  Relevant Imaging Results:  Relevant Lab Results:   Additional Information SSN: [id number]  Estanislado Emms, LCSW

## 2018-01-14 NOTE — Progress Notes (Signed)
Subjective:  Dry cough. No chest pain or palpitations   Objective:  Vitals:   01/13/18 1100 01/13/18 1810 01/13/18 2113 01/14/18 0525  BP: (!) 106/54 (!) 141/73 (!) 171/69 (!) 158/64  Pulse: (!) 59  73 69  Resp: 19  (!) 24 14  Temp: 98.2 F (36.8 C) 97.6 F (36.4 C) 97.7 F (36.5 C) 97.8 F (36.6 C)  TempSrc: Oral Oral Oral Oral  SpO2: 97%  93% 92%  Weight:    126 lb 8.7 oz (57.4 kg)  Height:        Intake/Output from previous day:  Intake/Output Summary (Last 24 hours) at 01/14/2018 0751 Last data filed at 01/14/2018 0600 Gross per 24 hour  Intake 1080 ml  Output 2300 ml  Net -1220 ml    Physical Exam: Affect appropriate Healthy:  appears stated age HEENT: normal Neck supple with no adenopathy JVP normal no bruits no thyromegaly Lungs decreased BS bases no wheezing and good diaphragmatic motion Heart:  S1/S2 no murmur, no rub, gallop or click PMI normal Abdomen: benighn, BS positve, no tenderness, no AAA no bruit.  No HSM or HJR Distal pulses intact with no bruits No edema Neuro non-focal Skin warm and dry No muscular weakness   Lab Results: Basic Metabolic Panel: Recent Labs    01/13/18 0401 01/14/18 0335  NA 128* 124*  K 4.2 4.5  CL 100* 94*  CO2 19* 22  GLUCOSE 81 102*  BUN 23* 35*  CREATININE 1.07* 1.07*  CALCIUM 8.0* 8.1*   Liver Function Tests: Recent Labs    01/13/18 0401 01/14/18 0335  AST 32 32  ALT 39 39  ALKPHOS 125 123  BILITOT 1.0 0.7  PROT 4.8* 5.3*  ALBUMIN 2.0* 2.2*   No results for input(s): LIPASE, AMYLASE in the last 72 hours. CBC: Recent Labs    01/13/18 0401 01/14/18 0335  WBC 7.7 9.2  HGB 10.2* 10.7*  HCT 29.1* 30.4*  MCV 82.9 83.1  PLT 248 288    Imaging: Dg Chest 2 View  Result Date: 01/13/2018 CLINICAL DATA:  Cough with shortness of breath for 3 days. EXAM: CHEST - 2 VIEW COMPARISON:  01/19/2018 FINDINGS: Since the prior study, lung aeration has worsened. Irregular interstitial thickening has  increased bilaterally. There is new basilar opacity obscuring hemidiaphragms consistent with pleural effusions. No pneumothorax. IMPRESSION: Worsened lung aeration consistent with congestive heart failure with interstitial pulmonary edema and bilateral pleural effusions. Electronically Signed   By: Lajean Manes M.D.   On: 01/13/2018 10:08    Cardiac Studies:  ECG: junctional rate 30 IVCD    Telemetry: SR rates 80   Echo: 12/09/17  EF 65-70% mild MR   Medications:   . amiodarone  100 mg Oral Daily  . amLODipine  5 mg Oral Daily  . aspirin EC  81 mg Oral Daily  . darifenacin  7.5 mg Oral Daily  . feeding supplement (ENSURE ENLIVE)  237 mL Oral TID BM  . feeding supplement (PRO-STAT SUGAR FREE 64)  30 mL Oral BID  . furosemide  20 mg Intravenous Daily  . heparin  5,000 Units Subcutaneous Q8H  . isosorbide mononitrate  30 mg Oral Daily  . levothyroxine  100 mcg Oral QAC breakfast  . pantoprazole  40 mg Oral Daily  . polyvinyl alcohol  2 drop Both Eyes QHS  . sodium chloride flush  3 mL Intravenous Q12H      Assessment/Plan:   Bradycardia:  Atenolol d/c HR improved in  60's with no long pauses. No indication for pacer at this Time  PAF: On low dose amiodarone. Maintaining NSR no anticoagulation due to age and fall risk   CHF:  Reviewed CXR  01/13/17 suggests CHF with bilateral effusions. She does not appear volume Overloaded on exam. BNP pending Primary service has ordered repeat echo although echo reviewed  From 12/09/17 with EF 65-70% mild MR No clear if this is diastolic dysfunction or ARDS picture. CT Chest also ordered and pending Good diuresis with once daily PO lasix  CAD:  No chest pain ASA and nitrates beta blocker d/c due to bradycardia  Thyroid:  On replacement T4 elevated but TSH normal   Jenkins Rouge 01/14/2018, 7:51 AM

## 2018-01-15 ENCOUNTER — Inpatient Hospital Stay (HOSPITAL_COMMUNITY): Payer: Medicare Other

## 2018-01-15 DIAGNOSIS — J984 Other disorders of lung: Secondary | ICD-10-CM

## 2018-01-15 DIAGNOSIS — IMO0002 Reserved for concepts with insufficient information to code with codable children: Secondary | ICD-10-CM

## 2018-01-15 DIAGNOSIS — R918 Other nonspecific abnormal finding of lung field: Secondary | ICD-10-CM

## 2018-01-15 DIAGNOSIS — M7989 Other specified soft tissue disorders: Secondary | ICD-10-CM

## 2018-01-15 DIAGNOSIS — T462X5A Adverse effect of other antidysrhythmic drugs, initial encounter: Secondary | ICD-10-CM

## 2018-01-15 DIAGNOSIS — R627 Adult failure to thrive: Secondary | ICD-10-CM

## 2018-01-15 LAB — COMPREHENSIVE METABOLIC PANEL
ALBUMIN: 2 g/dL — AB (ref 3.5–5.0)
ALK PHOS: 111 U/L (ref 38–126)
ALT: 34 U/L (ref 14–54)
AST: 35 U/L (ref 15–41)
Anion gap: 9 (ref 5–15)
BILIRUBIN TOTAL: 0.9 mg/dL (ref 0.3–1.2)
BUN: 41 mg/dL — AB (ref 6–20)
CALCIUM: 8.3 mg/dL — AB (ref 8.9–10.3)
CO2: 22 mmol/L (ref 22–32)
Chloride: 91 mmol/L — ABNORMAL LOW (ref 101–111)
Creatinine, Ser: 1.18 mg/dL — ABNORMAL HIGH (ref 0.44–1.00)
GFR calc Af Amer: 46 mL/min — ABNORMAL LOW (ref >60)
GFR calc non Af Amer: 40 mL/min — ABNORMAL LOW (ref >60)
GLUCOSE: 90 mg/dL (ref 65–99)
Potassium: 4.3 mmol/L (ref 3.5–5.1)
SODIUM: 122 mmol/L — AB (ref 135–145)
Total Protein: 5.1 g/dL — ABNORMAL LOW (ref 6.5–8.1)

## 2018-01-15 LAB — CBC
HEMATOCRIT: 29.7 % — AB (ref 36.0–46.0)
HEMOGLOBIN: 10.7 g/dL — AB (ref 12.0–15.0)
MCH: 29.9 pg (ref 26.0–34.0)
MCHC: 36 g/dL (ref 30.0–36.0)
MCV: 83 fL (ref 78.0–100.0)
Platelets: 272 10*3/uL (ref 150–400)
RBC: 3.58 MIL/uL — AB (ref 3.87–5.11)
RDW: 15.9 % — ABNORMAL HIGH (ref 11.5–15.5)
WBC: 8.2 10*3/uL (ref 4.0–10.5)

## 2018-01-15 LAB — BRAIN NATRIURETIC PEPTIDE: B Natriuretic Peptide: 112.9 pg/mL — ABNORMAL HIGH (ref 0.0–100.0)

## 2018-01-15 MED ORDER — APIXABAN 5 MG PO TABS: 5.0000 mg | ORAL_TABLET | Freq: Two times a day (BID) | ORAL | Status: DC

## 2018-01-15 MED ORDER — APIXABAN 5 MG PO TABS
10.0000 mg | ORAL_TABLET | Freq: Two times a day (BID) | ORAL | Status: DC
  Administered 2018-01-15 – 2018-01-18 (×8): 10 mg via ORAL
  Filled 2018-01-15 (×8): qty 2

## 2018-01-15 MED ORDER — SODIUM CHLORIDE 0.9 % IV SOLN
INTRAVENOUS | Status: AC
  Administered 2018-01-15: 08:00:00 via INTRAVENOUS

## 2018-01-15 NOTE — Progress Notes (Signed)
CSW met with patient and son at bedside. Family chooses [facility] for rehab. CSW to confirm patient's bed there and follow to support with discharge when medically ready.  Estanislado Emms, [place]

## 2018-01-15 NOTE — Progress Notes (Addendum)
Preliminary results Right upper extremity venous duplex completed Evidence of acute DVT in the radial vein. Evidence of acute superficial venous thrombosis in the basilic vein and cephalic vein.   Preliminary results discussed stephanie, RN.   Cardell Peach 01/15/2018 2:20 PM

## 2018-01-15 NOTE — Progress Notes (Signed)
Subjective:  Dry cough. No chest pain or palpitations   Objective:  Vitals:   01/15/18 0049 01/15/18 0426 01/15/18 0803 01/15/18 0807  BP: 127/75 127/72 115/63 125/60  Pulse: (!) 49 (!) 55 (!) 112 (!) 56  Resp: 17 16 19 16   Temp: 98.4 F (36.9 C) 98.1 F (36.7 C) (!) 97.5 F (36.4 C) 98.1 F (36.7 C)  TempSrc: Oral Oral Oral Oral  SpO2:   97% 96%  Weight:  127 lb 6.8 oz (57.8 kg)    Height:        Intake/Output from previous day:  Intake/Output Summary (Last 24 hours) at 01/15/2018 0175 Last data filed at 01/15/2018 0427 Gross per 24 hour  Intake 480 ml  Output 1100 ml  Net -620 ml    Physical Exam: Affect appropriate Healthy:  appears stated age HEENT: normal Neck supple with no adenopathy JVP normal no bruits no thyromegaly Lungs decreased BS bases no wheezing and good diaphragmatic motion Heart:  S1/S2 no murmur, no rub, gallop or click PMI normal Abdomen: benighn, BS positve, no tenderness, no AAA no bruit.  No HSM or HJR Distal pulses intact with no bruits No edema Neuro non-focal Skin warm and dry No muscular weakness   Lab Results: Basic Metabolic Panel: Recent Labs    01/14/18 0335 01/15/18 0440  NA 124* 122*  K 4.5 4.3  CL 94* 91*  CO2 22 22  GLUCOSE 102* 90  BUN 35* 41*  CREATININE 1.07* 1.18*  CALCIUM 8.1* 8.3*   Liver Function Tests: Recent Labs    01/14/18 0335 01/15/18 0440  AST 32 35  ALT 39 34  ALKPHOS 123 111  BILITOT 0.7 0.9  PROT 5.3* 5.1*  ALBUMIN 2.2* 2.0*   No results for input(s): LIPASE, AMYLASE in the last 72 hours. CBC: Recent Labs    01/14/18 0335 01/15/18 0440  WBC 9.2 8.2  HGB 10.7* 10.7*  HCT 30.4* 29.7*  MCV 83.1 83.0  PLT 288 272    Imaging: Ct Chest Wo Contrast  Result Date: 01/14/2018 CLINICAL DATA:  84 year old female with pleural effusions and possible multifocal pneumonia on chest CT last month. Subsequent encounter. EXAM: CT CHEST WITHOUT CONTRAST TECHNIQUE: Multidetector CT imaging of  the chest was performed following the standard protocol without IV contrast. COMPARISON:  Radiographs 01/13/2018 and earlier. Chest CT 12/09/2017. FINDINGS: Cardiovascular: Extensive Calcified aortic atherosclerosis. Extensive calcified coronary artery atherosclerosis and/or stents. Vascular patency is not evaluated in the absence of IV contrast. No cardiomegaly. No pericardial effusion. Mediastinum/Nodes: No mediastinal lymphadenopathy. No hilar lymphadenopathy is evident. Thyromegaly is stable with mild regional mass effect, no significant airway narrowing. A small to moderate gastric hiatal hernia is stable. Lungs/Pleura: Small layering bilateral pleural effusions persist, greater on the right, and are stable to slightly increased since March. Simple fluid density is noted suggesting transudate. The major airways are patent. No retained secretions are identified. There is increasing bilateral lower lobe confluent peribronchial opacity, bordering on consolidation in the distal lower lobes-and furthermore, this increasing solid lung opacity is very hyperdense (reminiscent of the increased liver density, series 3, image 84). Bilateral increased peribronchial and subpleural ground-glass opacity is noted diffusely. In some places the opacity resembles mosaic attenuation. Upper Abdomen: Increased intrinsic density of the liver redemonstrated. Ectasia and calcified atherosclerosis of the upper abdominal aorta (up to 32 millimeters diameter but tapers). Stable and otherwise negative visible upper abdominal viscera. Musculoskeletal: Osteopenia. Mild T2 and mild to moderate T12 superior endplate compression appears stable.  Stable visualized osseous structures. Round sclerotic focus in the L1 vertebral body is nonspecific but possibly a benign hemangioma; this was probably present on the lateral view the chest 09/08/2014. IMPRESSION: 1. The constellation of findings most compatible with Progressive Pulmonary Amiodarone  Toxicity. Increasing patchy ground-glass opacity, and increasing hyperdense consolidative airspace disease in the lower lobes. 2. Superimposed the small layering pleural effusions are stable to mildly increased since March. 3. Hyperdensity of the liver also in keeping with chronic amiodarone use. 4. Normal heart size, but extensive coronary artery and Aortic Atherosclerosis (ICD10-I70.0). Electronically Signed   By: Genevie Ann M.D.   On: 01/14/2018 09:07    Cardiac Studies:  ECG: junctional rate 30 IVCD    Telemetry: SR rates 80   Echo: 12/09/17  EF 65-70% mild MR   Medications:   . amLODipine  5 mg Oral Daily  . aspirin EC  81 mg Oral Daily  . darifenacin  7.5 mg Oral Daily  . feeding supplement (ENSURE ENLIVE)  237 mL Oral TID BM  . feeding supplement (PRO-STAT SUGAR FREE 64)  30 mL Oral BID  . heparin  5,000 Units Subcutaneous Q8H  . isosorbide mononitrate  30 mg Oral Daily  . levothyroxine  100 mcg Oral QAC breakfast  . pantoprazole  40 mg Oral Daily  . polyvinyl alcohol  2 drop Both Eyes QHS  . sodium chloride flush  3 mL Intravenous Q12H     . sodium chloride 50 mL/hr at 01/15/18 0754    Assessment/Plan:   Bradycardia:  Atenolol d/c HR improved in 60's with no long pauses. No indication for pacer at this Time  PAF: Amiodarone d/c see below  Maintaining NSR no anticoagulation due to age and fall risk   Pneumonitis:  CT yesterday read as possible amiodarone toxicity She was only on 100 mg daily don't see that Dr Ellyn Hack had ordered recent PFTls or DLCO CCM/ pulmonary to see Amiodarone d/c   CAD:  No chest pain ASA and nitrates beta blocker d/c due to bradycardia  Thyroid:  On replacement T4 elevated but TSH normal   Will sign off   Jenkins Rouge 01/15/2018, 9:09 AM

## 2018-01-15 NOTE — Plan of Care (Signed)
  Problem: Pain Managment: Goal: General experience of comfort will improve Outcome: Progressing Note:  Denies c/o pain or discomfort.   

## 2018-01-15 NOTE — Progress Notes (Addendum)
Patient ID: Jeanne Hart, female   DOB: 11/06/1926, 84 y.o.   MRN: [id number]                                                                PROGRESS NOTE                                                                                                                                                                                                             Patient Demographics:    Pricilla Moehle, is a 84 y.o. female, DOB - October 02, 1927, OEV:[id number]  Admit date - 01/21/2018   Admitting Physician Vianne Bulls, MD  Outpatient Primary MD for the patient is Odette Horns, MD  LOS - 5  Outpatient Specialists:     Chief Complaint  Patient presents with  . Bradycardia       Brief Narrative     84 y.o.femalewith medical history significant forparoxysmal atrial fibrillation not on anticoagulation due to history of bleeding and fall risk, hypertension, coronary artery disease, and hypothyroidism, now presenting to the emergency department from her nursing home for evaluation of generalized weakness and bradycardia. Patient reports that she had some vomiting proximately 3 days ago that has since resolved, but she has been generally weak since that time. She denies headache, change in vision or hearing, or focal numbness or weakness. She denies chest pain. She was noted at the nursing home to have heart rate at approximately 30 and was directed to the ED for evaluation of this.  ED Course:Upon arrival to the ED, patient is found to be afebrile, saturating well on room air, bradycardic in the low 30s, and with stable blood pressure. EKG features a junctional rhythm with rate 31 and LBBB. Chest x-rays negative for acute cardiopulmonary disease. Chemistry panel reveals a sodium of 120, potassium 6.1, and creatinine of 2.48, up from 1.00 onemonth ago. CBC features a mild normocytic anemia and leukocytosis. Cardiology was consulted by the ED physician, has evaluated the patient in the  emergency department, and patient was treated with Kayexalate, bicarbonate, insulin with dextrose, IV calcium, and albuterol in the ED. Heart rate has improved, blood pressure remained stable, and she will be admitted to the stepdown unit recommends a medical admission.For ongoing evaluation and management of symptomatic bradycardia.  Subjective:    Yittel Emrich today states that breathing is ok.  Yesterday CT chest showed ? Amiodarone toxicity. Slight cough (dry).  Denies fever, chills, cp, palp, n/v, diarrhea, brbpr.   No headache, No abdominal pain - No new weakness tingling or numbness,    Assessment  & Plan :    Principal Problem:   Symptomatic bradycardia Active Problems:   Essential hypertension   Paroxysmal atrial fibrillation [facility]): Not on Anticoagulation 2/2 GIB history.  CHA2DS2VASC =4   Hypothyroidism   CAD S/P percutaneous coronary angioplasty   CKD (chronic kidney disease) stage 3, GFR 30-59 ml/min (HCC)   AKI (acute kidney injury) (HCC)   Hyperkalemia   Hyponatremia   Protein-calorie malnutrition, severe   Cough   Acute on chronic combined systolic and diastolic CHF (congestive heart failure) ([place])      1. Symptomatic Bradycardia: Off Atenolol STOPPED  Amiodarone 4/8 Appreciate cardiology input  2. ? CHF, pleural effusions Lasix 40mg  iv x1 4/7 Lasix 20mg  iv qday 4/8 (stopped) Cardiac echo 3/3=> EF 65-70%   Check CT chest=>Amiodarone Toxicity  3.  Amiodarone toxicity STOP amiodarone 4/8 Pulmonary consult requested to evaluate  4. Hyperkalemia: Resolved  5. Hyponatremia: Sodium improved to 128. Continue monitoring, hydrate gently with ns iv Check cmpcortisol, tsh, serum osmin am Urine sodium, and osm ordered , appear to be pending  6. AKI on CKDIII:Stable, improved Check cmp in am  7. CAD: Stable, Asymptomatic.  8. HTN: Controlled.  9. TGG:YIRSWNIO and Amiodarone held. Monitor closely. Not on anticoagulation due to hx of  bleed.  10. Cough CXR 4/7=> CHF  11. Deconditioning PT to evaluate and tx SNF placement  12. Hyponatremia  ? SIADH Hydrate with ns gently  Check cmp in am        Code Status : FULL CODE  Family Communication  : w patient  Disposition Plan  : SNF per pt  Barriers For Discharge :   Consults  :  Cardiology, PT  Procedures  :   DVT Prophylaxis  :  Heparin - SCD        Lab Results  Component Value Date   PLT 272 01/15/2018      Anti-infectives (From admission, onward)   None        Objective:   Vitals:   01/14/18 1723 01/14/18 2112 01/15/18 0049 01/15/18 0426  BP: (!) 113/57 (!) 126/57 127/75 127/72  Pulse: 66 (!) 56 (!) 49 (!) 55  Resp: (!) 21 (!) 21 17 16   Temp: 98.2 F (36.8 C) 98.5 F (36.9 C) 98.4 F (36.9 C) 98.1 F (36.7 C)  TempSrc: Oral Oral Oral Oral  SpO2: 94% 98%    Weight:    57.8 kg (127 lb 6.8 oz)  Height:        Wt Readings from Last 3 Encounters:  01/15/18 57.8 kg (127 lb 6.8 oz)  12/07/17 59.2 kg (130 lb 8.2 oz)  08/23/17 61.2 kg (135 lb)     Intake/Output Summary (Last 24 hours) at 01/15/2018 0704 Last data filed at 01/15/2018 0427 Gross per 24 hour  Intake 720 ml  Output 1100 ml  Net -380 ml     Physical Exam  Awake Alert, Oriented X 3, No new F.N deficits, Normal affect [place].AT,PERRAL Supple Neck,No JVD, No cervical lymphadenopathy appriciated.  Symmetrical Chest wall movement, Good air movement bilaterally, slight crackle right lung base   RRR,No Gallops,Rubs or new Murmurs, No Parasternal Heave +ve B.Sounds, Abd Soft, No tenderness, No organomegaly  appriciated, No rebound - guarding or rigidity. No Cyanosis, Clubbing or edema, No new Rash or bruise     Data Review:    CBC Recent Labs  Lab 01/21/2018 1650 01/21/2018 1710 01/11/18 0255 01/13/18 0401 01/14/18 0335 01/15/18 0440  WBC 11.0*  --  8.9 7.7 9.2 8.2  HGB 11.0* 12.6 10.0* 10.2* 10.7* 10.7*  HCT 31.1* 37.0 28.3* 29.1* 30.4* 29.7*    PLT 269  --  217 248 288 272  MCV 84.1  --  83.2 82.9 83.1 83.0  MCH 29.7  --  29.4 29.1 29.2 29.9  MCHC 35.4  --  35.3 35.1 35.2 36.0  RDW 16.1*  --  15.7* 15.8* 15.9* 15.9*  LYMPHSABS 2.0  --   --   --   --   --   MONOABS 0.7  --   --   --   --   --   EOSABS 0.0  --   --   --   --   --   BASOSABS 0.0  --   --   --   --   --     Chemistries  Recent Labs  Lab 01/11/18 1002 01/11/18 1541 01/13/18 0401 01/14/18 0335 01/15/18 0440  NA 125* 125* 128* 124* 122*  K 4.6 4.6 4.2 4.5 4.3  CL 97* 99* 100* 94* 91*  CO2 19* 17* 19* 22 22  GLUCOSE 91 85 81 102* 90  BUN 38* 37* 23* 35* 41*  CREATININE 2.05* 1.82* 1.07* 1.07* 1.18*  CALCIUM 7.7* 7.7* 8.0* 8.1* 8.3*  AST  --   --  32 32 35  ALT  --   --  39 39 34  ALKPHOS  --   --  125 123 111  BILITOT  --   --  1.0 0.7 0.9   ------------------------------------------------------------------------------------------------------------------ No results for input(s): CHOL, HDL, LDLCALC, TRIG, CHOLHDL, LDLDIRECT in the last 72 hours.  No results found for: HGBA1C ------------------------------------------------------------------------------------------------------------------ No results for input(s): TSH, T4TOTAL, T3FREE, THYROIDAB in the last 72 hours.  Invalid input(s): FREET3 ------------------------------------------------------------------------------------------------------------------ No results for input(s): VITAMINB12, FOLATE, FERRITIN, TIBC, IRON, RETICCTPCT in the last 72 hours.  Coagulation profile No results for input(s): INR, PROTIME in the last 168 hours.  No results for input(s): DDIMER in the last 72 hours.  Cardiac Enzymes No results for input(s): CKMB, TROPONINI, MYOGLOBIN in the last 168 hours.  Invalid input(s): CK ------------------------------------------------------------------------------------------------------------------    Component Value Date/Time   BNP 112.9 (H) 01/15/2018 0440    Inpatient  Medications  Scheduled Meds: . amLODipine  5 mg Oral Daily  . aspirin EC  81 mg Oral Daily  . darifenacin  7.5 mg Oral Daily  . feeding supplement (ENSURE ENLIVE)  237 mL Oral TID BM  . feeding supplement (PRO-STAT SUGAR FREE 64)  30 mL Oral BID  . heparin  5,000 Units Subcutaneous Q8H  . isosorbide mononitrate  30 mg Oral Daily  . levothyroxine  100 mcg Oral QAC breakfast  . pantoprazole  40 mg Oral Daily  . polyvinyl alcohol  2 drop Both Eyes QHS  . sodium chloride flush  3 mL Intravenous Q12H   Continuous Infusions: PRN Meds:.acetaminophen **OR** acetaminophen, atropine, ondansetron **OR** ondansetron (ZOFRAN) IV, senna-docusate, zolpidem  Micro Results Recent Results (from the past 240 hour(s))  MRSA PCR Screening     Status: None   Collection Time: 01/11/18  5:29 AM  Result Value Ref Range Status   MRSA by PCR NEGATIVE NEGATIVE Final  Comment:        The GeneXpert MRSA Assay (FDA approved for NASAL specimens only), is one component of a comprehensive MRSA colonization surveillance program. It is not intended to diagnose MRSA infection nor to guide or monitor treatment for MRSA infections. Performed at [facility], [place] [address], [place], [place] [address]     Radiology Reports Dg Chest 2 View  Result Date: 01/13/2018 CLINICAL DATA:  Cough with shortness of breath for 3 days. EXAM: CHEST - 2 VIEW COMPARISON:  01/08/2018 FINDINGS: Since the prior study, lung aeration has worsened. Irregular interstitial thickening has increased bilaterally. There is new basilar opacity obscuring hemidiaphragms consistent with pleural effusions. No pneumothorax. IMPRESSION: Worsened lung aeration consistent with congestive heart failure with interstitial pulmonary edema and bilateral pleural effusions. Electronically Signed   By: Lajean Manes M.D.   On: 01/13/2018 10:08   Ct Chest Wo Contrast  Result Date: 01/14/2018 CLINICAL DATA:  84 year old female with pleural effusions and  possible multifocal pneumonia on chest CT last month. Subsequent encounter. EXAM: CT CHEST WITHOUT CONTRAST TECHNIQUE: Multidetector CT imaging of the chest was performed following the standard protocol without IV contrast. COMPARISON:  Radiographs 01/13/2018 and earlier. Chest CT 12/09/2017. FINDINGS: Cardiovascular: Extensive Calcified aortic atherosclerosis. Extensive calcified coronary artery atherosclerosis and/or stents. Vascular patency is not evaluated in the absence of IV contrast. No cardiomegaly. No pericardial effusion. Mediastinum/Nodes: No mediastinal lymphadenopathy. No hilar lymphadenopathy is evident. Thyromegaly is stable with mild regional mass effect, no significant airway narrowing. A small to moderate gastric hiatal hernia is stable. Lungs/Pleura: Small layering bilateral pleural effusions persist, greater on the right, and are stable to slightly increased since March. Simple fluid density is noted suggesting transudate. The major airways are patent. No retained secretions are identified. There is increasing bilateral lower lobe confluent peribronchial opacity, bordering on consolidation in the distal lower lobes-and furthermore, this increasing solid lung opacity is very hyperdense (reminiscent of the increased liver density, series 3, image 84). Bilateral increased peribronchial and subpleural ground-glass opacity is noted diffusely. In some places the opacity resembles mosaic attenuation. Upper Abdomen: Increased intrinsic density of the liver redemonstrated. Ectasia and calcified atherosclerosis of the upper abdominal aorta (up to 32 millimeters diameter but tapers). Stable and otherwise negative visible upper abdominal viscera. Musculoskeletal: Osteopenia. Mild T2 and mild to moderate T12 superior endplate compression appears stable. Stable visualized osseous structures. Round sclerotic focus in the L1 vertebral body is nonspecific but possibly a benign hemangioma; this was probably  present on the lateral view the chest 09/08/2014. IMPRESSION: 1. The constellation of findings most compatible with Progressive Pulmonary Amiodarone Toxicity. Increasing patchy ground-glass opacity, and increasing hyperdense consolidative airspace disease in the lower lobes. 2. Superimposed the small layering pleural effusions are stable to mildly increased since March. 3. Hyperdensity of the liver also in keeping with chronic amiodarone use. 4. Normal heart size, but extensive coronary artery and Aortic Atherosclerosis (ICD10-I70.0). Electronically Signed   By: Genevie Ann M.D.   On: 01/14/2018 09:07   Dg Chest Port 1 View  Result Date: 01/30/2018 CLINICAL DATA:  84 year old female with weakness, bradycardia. EXAM: PORTABLE CHEST 1 VIEW COMPARISON:  Chest CT 12/09/2017 and earlier. FINDINGS: Portable AP semi upright view at 1648 hours. Pacer pads project over the chest. Stable cardiac size and mediastinal contours. Calcified aortic atherosclerosis. Stable lung volumes. No pneumothorax, pulmonary edema, pleural effusion or acute pulmonary opacity. Paucity bowel gas in the upper abdomen. IMPRESSION: No acute cardiopulmonary abnormality. Electronically Signed  By: Genevie Ann M.D.   On: 02/04/2018 17:12    Time Spent in minutes  30   Jani Gravel M.D on 01/15/2018 at 7:04 AM  Between 7am to 7pm - Pager - [phone]    After 7pm go to [email or web address] - password [facility]  [facility] -  Office  [phone]

## 2018-01-15 NOTE — Progress Notes (Signed)
Per RN, received call report that she has a DVT in the RUE Will initiate [place] to dose, DC heparin [place]

## 2018-01-15 NOTE — Discharge Instructions (Signed)
Information on my medicine - ELIQUIS (apixaban)  This medication education was reviewed with me or my healthcare representative as part of my discharge preparation.  The pharmacist that spoke with me during my hospital stay was:  Saundra Shelling, [facility]  Why was Eliquis prescribed for you? Eliquis was prescribed to treat blood clots that may have been found in the veins of your legs (deep vein thrombosis) or in your lungs (pulmonary embolism) and to reduce the risk of them occurring again.  What do You need to know about Eliquis ? The starting dose is 10 mg (two 5 mg tablets) taken TWICE daily for the FIRST SEVEN (7) DAYS, then on (enter date)  01/22/18  the dose is reduced to ONE 5 mg tablet taken TWICE daily.  Eliquis may be taken with or without food.   Try to take the dose about the same time in the morning and in the evening. If you have difficulty swallowing the tablet whole please discuss with your pharmacist how to take the medication safely.  Take Eliquis exactly as prescribed and DO NOT stop taking Eliquis without talking to the doctor who prescribed the medication.  Stopping may increase your risk of developing a new blood clot.  Refill your prescription before you run out.  After discharge, you should have regular check-up appointments with your healthcare provider that is prescribing your Eliquis.    What do you do if you miss a dose? If a dose of ELIQUIS is not taken at the scheduled time, take it as soon as possible on the same day and twice-daily administration should be resumed. The dose should not be doubled to make up for a missed dose.  Important Safety Information A possible side effect of Eliquis is bleeding. You should call your healthcare provider right away if you experience any of the following: ? Bleeding from an injury or your nose that does not stop. ? Unusual colored urine (red or dark brown) or unusual colored stools (red or black). ? Unusual bruising for  unknown reasons. ? A serious fall or if you hit your head (even if there is no bleeding).  Some medicines may interact with Eliquis and might increase your risk of bleeding or clotting while on Eliquis. To help avoid this, consult your healthcare provider or pharmacist prior to using any new prescription or non-prescription medications, including herbals, vitamins, non-steroidal anti-inflammatory drugs (NSAIDs) and supplements.  This website has more information on Eliquis (apixaban): http://www.eliquis.com/eliquis/home

## 2018-01-15 NOTE — Progress Notes (Signed)
ANTICOAGULATION CONSULT NOTE - Initial Consult  Pharmacy Consult:  Eliquis Indication:  Acute DVT  Allergies  Allergen Reactions  . Nexium [Esomeprazole] Other (See Comments)    Aggravates IBS.   . Statins Other (See Comments)    Had jaundice as child.  Does tolerate current statin therapy.    Abel Presto [Phenazopyridine] Other (See Comments)    On MAR  . Codeine Other (See Comments)    Post-surgical reaction of unknown type.  . Morphine And Related Other (See Comments)    Unknown.    Marland Kitchen Penicillins Other (See Comments)    Unknown. Has patient had a PCN reaction causing immediate rash, facial/tongue/throat swelling, SOB or lightheadedness with hypotension: YES Has patient had a PCN reaction causing severe rash involving mucus membranes or skin necrosis:NO Has patient had a PCN reaction that required hospitalization NO Has patient had a PCN reaction occurring within the last 10 years: NO If all of the above answers are "NO", then may proceed with Cephalosporin use.     . Sulfa Antibiotics Other (See Comments)    Unknown childhood reaction.      Patient Measurements: Height: 5\' 5"  (165.1 cm) Weight: 127 lb 6.8 oz (57.8 kg) IBW/kg (Calculated) : 57  Vital Signs: Temp: 98.4 F (36.9 C) (04/09 1208) Temp Source: Oral (04/09 1208) BP: 129/60 (04/09 1208) Pulse Rate: 65 (04/09 1208)  Labs: Recent Labs    01/13/18 0401 01/14/18 0335 01/15/18 0440  HGB 10.2* 10.7* 10.7*  HCT 29.1* 30.4* 29.7*  PLT 248 288 272  CREATININE 1.07* 1.07* 1.18*    Estimated Creatinine Clearance: 29.7 mL/min (A) (by C-G formula based on SCr of 1.18 mg/dL (H)).   Medical History: Past Medical History:  Diagnosis Date  . Anxiety   . Arthritis    "back" (01/11/2018)  . Basal cell carcinoma    "face; back" (01/11/2018)  . CAD S/P percutaneous coronary angioplasty    PCI x 2; once at [facility] and once in [place];; Myoview April 2017: LOW RISK. EF 63%. No ischemia or infarction.  .  Diverticulitis   . Dyslipidemia   . GERD (gastroesophageal reflux disease)   . Hepatitis    "as an infant/child" (01/11/2018)  . Hiatal hernia   . History of kidney stones   . HTN (hypertension)   . Hypothyroidism   . IBS (irritable bowel syndrome)   . Insomnia    chronic  . Migraine    "stopped ~ age 84" (01/11/2018)  . PAF (paroxysmal atrial fibrillation) ([place])    CHA2DS2VASC = 4.  coumadin was stopped secondary to GI Bleed, on amiodarone; monitor march 2014-NSR  . Pneumonia 12/2017  . Squamous carcinoma    "face; back" (01/11/2018)      Assessment: 84 YOF with history of Afib not on anticoagulation due to bleeding and fall risks.  Now with acute RUE DVT and Pharmacy consulted to dose Eliquis.  SCr < 2.5 and CrCL > 25 ml/min.  CBC/LFTs stable and no bleeding has been reported.  Noted patient received heparin SQ this AM around 0630.    Goal of Therapy:  Full anticoagulation Monitor platelets by anticoagulation protocol: Yes    Plan:  Eliquis 10mg  PO BID x 7 days, then on 01/22/18 start 5mg  PO BID F/U Doppler result to confirm Pharmacy will sign off and follow peripherally.  Thank you for the consult!   Dominick Zertuche D. Mina Marble, PharmD, BCPS Pager:  [phone] 01/15/2018, 1:03 PM

## 2018-01-15 NOTE — Progress Notes (Signed)
PT Cancellation Note  Patient Details Name: ILINA XU MRN: [id number] DOB: 04-01-1927   Cancelled Treatment:    Reason Eval/Treat Not Completed: Medical issues which prohibited therapy; patient with new DVT and not yet on anticoagulation long enough for mobilization.  Will check another day.    Reginia Naas 01/15/2018, 3:45 PM

## 2018-01-15 NOTE — Care Management Important Message (Signed)
Important Message  Patient Details  Name: Jeanne Hart MRN: [id number] Date of Birth: 05-19-1927   Medicare Important Message Given:  Yes    Barb Merino [place] 01/15/2018, 12:49 PM

## 2018-01-15 NOTE — Consult Note (Signed)
Name: Jeanne Hart MRN: [id number] DOB: Oct 27, 1926    ADMISSION DATE:  01/15/2018 CONSULTATION DATE: 01/15/2018  REFERRING MD : Triad  CHIEF COMPLAINT: Bradycardia  BRIEF PATIENT DESCRIPTION:  Frail elderly female no acute distress  SIGNIFICANT EVENTS    STUDIES:  CT is noted   HISTORY OF PRESENT ILLNESS:   84 year old female, never smoker, who was recently admitted to [facility] in March 2019 status post fall, 3 pound weight loss, history of basal cell carcinoma who was subsequently discharged to a skilled nursing facility.  She has an extensive past medical history which is well documented in the past medical history section.  Recent CT scan on 12/09/2017 revealed opacities that were suspected to be an infectious process.   She returns to [facility] on 01/31/2018 with generalized weakness and bradycardia.  She had some nausea prior to admission.  She is on amiodarone along with metoprolol and Norvasc.  Her bradycardia has improved.  She does have renal insufficiency with a baseline creatinine 1.  Repeat CT scan reveals bilateral changes that have not progressed since her previous CT scan on 12/09/2017.  Consistent with amiodarone toxicity versus pneumonitis.  She does report a nonproductive cough.  Currently she is on oxygen she has never worn oxygen before.  She appears to have general failure to thrive with a 30 pound weight loss, she has been unable to walk since her last admission to [facility] and being transferred to skilled nursing facility.  She is awake and alert in no acute distress but has obvious muscle wasting and edema especially in her right arm.  She is a poor historian at this time.  Most information is gleaned from the chart.  Pulmonary critical care asked to comment on her CT findings, agree with stopping amiodarone especially since she is bradycardic.  Her white count and temperature curve does not support an infectious process.  I note that in March her  sed rate was 22.   PAST MEDICAL HISTORY :   has a past medical history of Anxiety, Arthritis, Basal cell carcinoma, CAD S/P percutaneous coronary angioplasty, Diverticulitis, Dyslipidemia, GERD (gastroesophageal reflux disease), Hepatitis, Hiatal hernia, History of kidney stones, HTN (hypertension), Hypothyroidism, IBS (irritable bowel syndrome), Insomnia, Migraine, PAF (paroxysmal atrial fibrillation) (HCC), Pneumonia (12/2017), and Squamous carcinoma.  has a past surgical history that includes Appendectomy; Abdominal hysterectomy; NM MYOVIEW LTD (April 2017); Cholecystectomy open; Carpal tunnel release (Left); Excision basal cell carcinoma; Squamous cell carcinoma excision; Cataract extraction w/ intraocular lens  implant, bilateral (Bilateral); Coronary angioplasty with stent (01/05/2011); Coronary angioplasty with stent (?); and Cardiac catheterization. Prior to Admission medications   Medication Sig Start Date End Date Taking? Authorizing Provider  amiodarone (PACERONE) 200 MG tablet TAKE 1 TABLET BY MOUTH ONCE DAILY AND 1 TABLET AS NEEDED FOR FAST HEART RATE Patient taking differently: TAKE 200 mg  TABLET BY MOUTH ONCE DAILY 06/18/17  Yes Marykay Lex, MD  amLODipine (NORVASC) 5 MG tablet Take 1 tablet by mouth daily. take 1 tab dialy 05/07/15  Yes [provider]  Ascorbic Acid (VITAMIN C PO) Take 1 tablet by mouth daily.    Yes [provider]  aspirin EC 81 MG tablet Take 1 tablet (81 mg total) by mouth daily. 02/15/15  Yes Marykay Lex, MD  atenolol (TENORMIN) 25 MG tablet Take 25 mg by mouth daily.   Yes [provider]  Cholecalciferol (VITAMIN D PO) Take 1 tablet by mouth daily.    Yes [provider]  isosorbide mononitrate (IMDUR) 30 MG 24 hr tablet Take 1 tablet (30 mg total) by mouth daily. 09/09/14  Yes Harris, Abigail, PA-C  levothyroxine (SYNTHROID, LEVOTHROID) 100 MCG tablet Take 100 mcg by mouth daily before breakfast.   Yes [provider]  nitroGLYCERIN (NITROSTAT) 0.4 MG SL tablet Place 1 tablet (0.4 mg total) under the tongue every 5 (five) minutes as needed for chest pain. MAX 3 doses 02/05/17  Yes Marykay Lex, MD  pantoprazole (PROTONIX) 40 MG tablet Take 40 mg by mouth daily.    Yes [provider]  Polyethyl Glycol-Propyl Glycol (SYSTANE) 0.4-0.3 % SOLN Apply 2 drops to eye at bedtime.   Yes [provider]  potassium chloride (K-DUR) 10 MEQ tablet Take 1 tablet (10 mEq total) by mouth daily. 03/05/16  Yes Lindalou Hose, MD  potassium chloride (K-DUR,KLOR-CON) 10 MEQ tablet Take 10 mEq by mouth daily. 01/14/2018  Yes [provider]  promethazine (PHENERGAN) 25 MG tablet Take 25 mg by mouth every 6 (six) hours as needed. 01/08/18  Yes [provider]  Solifenacin Succinate (VESICARE PO) Take 1 tablet by mouth daily.   Yes [provider]  zolpidem (AMBIEN CR) 12.5 MG CR tablet Take 12.5 mg by mouth every other day.   Yes [provider]  feeding supplement, ENSURE ENLIVE, (ENSURE ENLIVE) LIQD Take 237 mLs by mouth 2 (two) times daily between meals. 12/11/17   Marguerita Merles Latif, DO   Allergies  Allergen Reactions  . Nexium [Esomeprazole] Other (See Comments)    Aggravates IBS.   . Statins Other (See Comments)    Had jaundice as child.  Does tolerate current statin therapy.    Concepcion Elk [Phenazopyridine] Other (See Comments)    On MAR  . Codeine Other (See Comments)    Post-surgical reaction of unknown type.  . Morphine And Related Other (See Comments)    Unknown.    Marland Kitchen Penicillins Other (See Comments)    Unknown. Has patient had a PCN reaction causing immediate rash, facial/tongue/throat swelling, SOB or lightheadedness with hypotension: YES Has patient had a PCN reaction causing severe rash involving mucus membranes or skin necrosis:NO Has patient had a PCN reaction that required hospitalization NO Has patient had a PCN reaction occurring within the last 10  years: NO If all of the above answers are "NO", then may proceed with Cephalosporin use.     . Sulfa Antibiotics Other (See Comments)    Unknown childhood reaction.      FAMILY HISTORY:  family history includes Diabetes in her maternal aunt and son; Heart disease in her unknown relative; Stroke in her mother. SOCIAL HISTORY:  reports that she has never smoked. She has never used smokeless tobacco. She reports that she does not drink alcohol or use drugs.  REVIEW OF SYSTEMS:   10 point review of system taken, please see HPI for positives and negatives.   SUBJECTIVE:  No acute distress at rest VITAL SIGNS: Temp:  [97.5 F (36.4 C)-98.5 F (36.9 C)] 98.1 F (36.7 C) (04/09 0807) Pulse Rate:  [49-112] 56 (04/09 0807) Resp:  [16-22] 16 (04/09 0807) BP: (113-133)/(57-84) 125/60 (04/09 0807) SpO2:  [91 %-98 %] 96 % (04/09 0807) Weight:  [57.8 kg (127 lb 6.8 oz)] 57.8 kg (127 lb 6.8 oz) (04/09 0426)  PHYSICAL EXAMINATION: General: Frail elderly female he is on oxygen, poor historian. Neuro: Follows commands moves all extremities x4, short-term and long-term memory HEENT: No JVD lymphadenopathy is appreciated  Cardiovascular: Heart sounds are regular with a sinus bradycardia Lungs: Decreased breath sounds at the base Abdomen: Soft nontender Musculoskeletal: Asymmetrical edema noted on right arm without heat or any type of indication of infection Skin: Warm and dry  Recent Labs  Lab 01/13/18 0401 01/14/18 0335 01/15/18 0440  NA 128* 124* 122*  K 4.2 4.5 4.3  CL 100* 94* 91*  CO2 19* 22 22  BUN 23* 35* 41*  CREATININE 1.07* 1.07* 1.18*  GLUCOSE 81 102* 90   Recent Labs  Lab 01/13/18 0401 01/14/18 0335 01/15/18 0440  HGB 10.2* 10.7* 10.7*  HCT 29.1* 30.4* 29.7*  WBC 7.7 9.2 8.2  PLT 248 288 272   Ct Chest Wo Contrast  Result Date: 01/14/2018 CLINICAL DATA:  84 year old female with pleural effusions and possible multifocal pneumonia on chest CT last month.  Subsequent encounter. EXAM: CT CHEST WITHOUT CONTRAST TECHNIQUE: Multidetector CT imaging of the chest was performed following the standard protocol without IV contrast. COMPARISON:  Radiographs 01/13/2018 and earlier. Chest CT 12/09/2017. FINDINGS: Cardiovascular: Extensive Calcified aortic atherosclerosis. Extensive calcified coronary artery atherosclerosis and/or stents. Vascular patency is not evaluated in the absence of IV contrast. No cardiomegaly. No pericardial effusion. Mediastinum/Nodes: No mediastinal lymphadenopathy. No hilar lymphadenopathy is evident. Thyromegaly is stable with mild regional mass effect, no significant airway narrowing. A small to moderate gastric hiatal hernia is stable. Lungs/Pleura: Small layering bilateral pleural effusions persist, greater on the right, and are stable to slightly increased since March. Simple fluid density is noted suggesting transudate. The major airways are patent. No retained secretions are identified. There is increasing bilateral lower lobe confluent peribronchial opacity, bordering on consolidation in the distal lower lobes-and furthermore, this increasing solid lung opacity is very hyperdense (reminiscent of the increased liver density, series 3, image 84). Bilateral increased peribronchial and subpleural ground-glass opacity is noted diffusely. In some places the opacity resembles mosaic attenuation. Upper Abdomen: Increased intrinsic density of the liver redemonstrated. Ectasia and calcified atherosclerosis of the upper abdominal aorta (up to 32 millimeters diameter but tapers). Stable and otherwise negative visible upper abdominal viscera. Musculoskeletal: Osteopenia. Mild T2 and mild to moderate T12 superior endplate compression appears stable. Stable visualized osseous structures. Round sclerotic focus in the L1 vertebral body is nonspecific but possibly a benign hemangioma; this was probably present on the lateral view the chest 09/08/2014.  IMPRESSION: 1. The constellation of findings most compatible with Progressive Pulmonary Amiodarone Toxicity. Increasing patchy ground-glass opacity, and increasing hyperdense consolidative airspace disease in the lower lobes. 2. Superimposed the small layering pleural effusions are stable to mildly increased since March. 3. Hyperdensity of the liver also in keeping with chronic amiodarone use. 4. Normal heart size, but extensive coronary artery and Aortic Atherosclerosis (ICD10-I70.0). Electronically Signed   By: Odessa Fleming M.D.   On: 01/14/2018 09:07            Impression:   FTT (failure to thrive) in adult, 30 pound wt loss over 2 months, unable to walk for one month.   Opacity of lung on imaging study, bilateral infection vs amio toxcity   Symptomatic bradycardia   Essential hypertension   Paroxysmal atrial fibrillation [facility]): Not on Anticoagulation 2/2 GIB history.  CHA2DS2VASC =4   Hypothyroidism   CAD S/P percutaneous coronary angioplasty   CKD (chronic kidney disease) stage 3, GFR 30-59 ml/min (HCC)   AKI (acute kidney injury) (HCC)   Hyperkalemia   Hyponatremia   Protein-calorie malnutrition, severe   Cough   Acute on chronic  combined systolic and diastolic CHF (congestive heart failure) (HCC)   Squamous carcinoma    Discussion: 84 year old female, never smoker, who was recently admitted to [facility] in March 2019 status post fall, 3 pound weight loss, history of basal cell carcinoma who was subsequently discharged to a skilled nursing facility.  She has an extensive past medical history which is well documented in the past medical history section.  Recent CT scan on 12/09/2017 revealed opacities that were suspected to be an infectious process.   She returns to [facility] on 01/19/2018 with generalized weakness and bradycardia.  She had some nausea prior to admission.  She is on amiodarone along with metoprolol and Norvasc.  Her bradycardia has improved.  She does have  renal insufficiency with a baseline creatinine 1.  Repeat CT scan reveals bilateral changes that have not progressed since her previous CT scan on 12/09/2017.  Consistent with amiodarone toxicity versus pneumonitis.  She does report a nonproductive cough.  Currently she is on oxygen she has never worn oxygen before.  She appears to have general failure to thrive with a 30 pound weight loss, she has been unable to walk since her last admission to [facility] and being transferred to skilled nursing facility.  She is awake and alert in no acute distress but has obvious muscle wasting and edema especially in her right arm.  She is a poor historian at this time.  Most information is gleaned from the chart.  Pulmonary critical care asked to comment on her CT findings, agree with stopping amiodarone especially since she is bradycardic.  Her white count and temperature curve does not support an infectious process.  I note that in March her sed rate was 22.   Plan:  Agree with stopping amiodarone  Follow-up CT scan to monitor  Questionable role for steroids  Monitor renal function  All other issues per primary care  [facility] Rosamond Andress ACNP [facility] Pollack PCCM Pager [phone] till 1 pm If no answer page [phone]- [phone] 01/15/2018, 8:59 AM

## 2018-01-16 DIAGNOSIS — J984 Other disorders of lung: Secondary | ICD-10-CM

## 2018-01-16 DIAGNOSIS — I82409 Acute embolism and thrombosis of unspecified deep veins of unspecified lower extremity: Secondary | ICD-10-CM

## 2018-01-16 DIAGNOSIS — T462X5A Adverse effect of other antidysrhythmic drugs, initial encounter: Secondary | ICD-10-CM

## 2018-01-16 LAB — COMPREHENSIVE METABOLIC PANEL
ALBUMIN: 2.1 g/dL — AB (ref 3.5–5.0)
ALK PHOS: 119 U/L (ref 38–126)
ALT: 36 U/L (ref 14–54)
AST: 37 U/L (ref 15–41)
Anion gap: 11 (ref 5–15)
BILIRUBIN TOTAL: 0.8 mg/dL (ref 0.3–1.2)
BUN: 41 mg/dL — AB (ref 6–20)
CALCIUM: 8.4 mg/dL — AB (ref 8.9–10.3)
CO2: 22 mmol/L (ref 22–32)
Chloride: 90 mmol/L — ABNORMAL LOW (ref 101–111)
Creatinine, Ser: 1.03 mg/dL — ABNORMAL HIGH (ref 0.44–1.00)
GFR calc Af Amer: 55 mL/min — ABNORMAL LOW (ref >60)
GFR calc non Af Amer: 47 mL/min — ABNORMAL LOW (ref >60)
GLUCOSE: 101 mg/dL — AB (ref 65–99)
Potassium: 4.6 mmol/L (ref 3.5–5.1)
Sodium: 123 mmol/L — ABNORMAL LOW (ref 135–145)
TOTAL PROTEIN: 5.3 g/dL — AB (ref 6.5–8.1)

## 2018-01-16 LAB — CBC
HCT: 29.6 % — ABNORMAL LOW (ref 36.0–46.0)
HEMATOCRIT: 30.6 % — AB (ref 36.0–46.0)
HEMOGLOBIN: 10.9 g/dL — AB (ref 12.0–15.0)
Hemoglobin: 10.6 g/dL — ABNORMAL LOW (ref 12.0–15.0)
MCH: 29.7 pg (ref 26.0–34.0)
MCH: 30.1 pg (ref 26.0–34.0)
MCHC: 35.6 g/dL (ref 30.0–36.0)
MCHC: 35.8 g/dL (ref 30.0–36.0)
MCV: 83.4 fL (ref 78.0–100.0)
MCV: 84.1 fL (ref 78.0–100.0)
Platelets: 305 10*3/uL (ref 150–400)
Platelets: 294 10*3/uL (ref 150–400)
RBC: 3.67 MIL/uL — ABNORMAL LOW (ref 3.87–5.11)
RBC: 3.52 MIL/uL — ABNORMAL LOW (ref 3.87–5.11)
RDW: 16.1 % — ABNORMAL HIGH (ref 11.5–15.5)
RDW: 16.4 % — AB (ref 11.5–15.5)
WBC: 8.1 10*3/uL (ref 4.0–10.5)
WBC: 10.4 10*3/uL (ref 4.0–10.5)

## 2018-01-16 LAB — MAGNESIUM: Magnesium: 1.5 mg/dL — ABNORMAL LOW (ref 1.7–2.4)

## 2018-01-16 LAB — PHOSPHORUS: Phosphorus: 2.8 mg/dL (ref 2.5–4.6)

## 2018-01-16 LAB — UREA NITROGEN, URINE: Urea Nitrogen, Ur: 351 mg/dL

## 2018-01-16 LAB — SEDIMENTATION RATE: Sed Rate: 40 mm/hr — ABNORMAL HIGH (ref 0–22)

## 2018-01-16 LAB — PROCALCITONIN: Procalcitonin: 0.15 ng/mL

## 2018-01-16 MED ORDER — MAGNESIUM SULFATE 2 GM/50ML IV SOLN
2.0000 g | Freq: Once | INTRAVENOUS | Status: AC
  Administered 2018-01-16: 2 g via INTRAVENOUS
  Filled 2018-01-16: qty 50

## 2018-01-16 MED ORDER — SODIUM CHLORIDE 0.9 % IV SOLN
INTRAVENOUS | Status: AC
  Administered 2018-01-16: 09:00:00 via INTRAVENOUS

## 2018-01-16 MED ORDER — MAGNESIUM SULFATE IN D5W 1-5 GM/100ML-% IV SOLN
1.0000 g | Freq: Once | INTRAVENOUS | Status: AC
  Administered 2018-01-16: 1 g via INTRAVENOUS
  Filled 2018-01-16: qty 100

## 2018-01-16 MED ORDER — HYDROCORTISONE ACETATE 25 MG RE SUPP
25.0000 mg | Freq: Two times a day (BID) | RECTAL | Status: DC | PRN
  Filled 2018-01-16: qty 1

## 2018-01-16 MED ORDER — PREDNISONE 20 MG PO TABS
40.0000 mg | ORAL_TABLET | Freq: Every day | ORAL | Status: DC
  Administered 2018-01-16 – 2018-01-19 (×4): 40 mg via ORAL
  Filled 2018-01-16 (×5): qty 2

## 2018-01-16 NOTE — Progress Notes (Signed)
Name: Jeanne Hart MRN: [id number] DOB: 10/05/1927    ADMISSION DATE:  01/26/2018 CONSULTATION DATE: 01/15/2018  REFERRING MD : Triad  CHIEF COMPLAINT: Bradycardia  SIGNIFICANT EVENTS:   STUDIES:  CT is concerning for amiodarone lung tox with patchy GGO and hyperdense airspace disease in the lower lobes. Small layering pleural effusions.   HISTORY OF PRESENT ILLNESS:   84 year old female, never smoker, who was recently admitted to [facility] in March 2019 status post fall, 3 pound weight loss, history of basal cell carcinoma who was subsequently discharged to a skilled nursing facility.  She has an extensive past medical history which is well documented in the past medical history section.  Recent CT scan on 12/09/2017 revealed opacities that were suspected to be an infectious process.   She returns to [facility] on 01/28/2018 with generalized weakness and bradycardia.  She had some nausea prior to admission.  She is on amiodarone along with metoprolol and Norvasc.  Her bradycardia has improved.  She does have renal insufficiency with a baseline creatinine 1.  Repeat CT scan reveals bilateral changes that have not progressed since her previous CT scan on 12/09/2017.  Consistent with amiodarone toxicity versus pneumonitis.  She does report a nonproductive cough.  Currently she is on oxygen she has never worn oxygen before.  She appears to have general failure to thrive with a 30 pound weight loss, she has been unable to walk since her last admission to [facility] and being transferred to skilled nursing facility.  She is awake and alert in no acute distress but has obvious muscle wasting and edema especially in her right arm.  She is a poor historian at this time.  Most information is gleaned from the chart.  Pulmonary critical care asked to comment on her CT findings, agree with stopping amiodarone especially since she is bradycardic.  Her white count and temperature curve does not  support an infectious process.  I note that in March her sed rate was 22.  SUBJECTIVE:  Breathing comfortably on room air.   VITAL SIGNS: Temp:  [98.2 F (36.8 C)-98.6 F (37 C)] 98.2 F (36.8 C) (04/10 0800) Pulse Rate:  [58-66] 66 (04/10 0800) Resp:  [18-20] 18 (04/10 0800) BP: (121-148)/(54-62) 148/55 (04/10 0800) SpO2:  [93 %] 93 % (04/10 0800) Weight:  [57.1 kg (125 lb 14.4 oz)] 57.1 kg (125 lb 14.4 oz) (04/10 0700)  PHYSICAL EXAMINATION: General: Frail elderly female in NAD on room air.  Neuro: Follows commands moves all extremities x4 HEENT: No JVD lymphadenopathy is appreciated. [place]/AT Cardiovascular: Heart sounds are regular with a sinus bradycardia Lungs: Clear bilateral breath sounds Abdomen: Soft, nontender, non-distended Musculoskeletal: Asymmetrical edema noted on right arm without heat or any type of indication of infection  Skin: Warm and dry  Recent Labs  Lab 01/14/18 0335 01/15/18 0440 01/16/18 0336  NA 124* 122* 123*  K 4.5 4.3 4.6  CL 94* 91* 90*  CO2 22 22 22   BUN 35* 41* 41*  CREATININE 1.07* 1.18* 1.03*  GLUCOSE 102* 90 101*   Recent Labs  Lab 01/14/18 0335 01/15/18 0440 01/16/18 0336  HGB 10.7* 10.7* 10.9*  HCT 30.4* 29.7* 30.6*  WBC 9.2 8.2 8.1  PLT 288 272 305   No results found.          Impression: Pulmonary infiltrates: Patchy airspace disease with GGO on CT. Concerning for amiodarone lung toxicity or other hypersensitivity.    Autoimmune/Hypersens workup still pending (SSA, SSB,  GBM, ANCA, Anti SCL, CCP, RF, ANA). Sed rate 40.   Plan: Amiodarone off Start prednisone 40mg  daily x 1 week, then 30mg  daily x 1 week, then 20mg  daily until follow up.  Continue to follow serologic workup.   Georgann Housekeeper, AGACNP-BC [facility] Pulmonology/Critical Care Pager [phone] or [phone]  01/16/2018 11:38 AM

## 2018-01-16 NOTE — Plan of Care (Signed)
  Problem: Pain Managment: Goal: General experience of comfort will improve Outcome: Progressing Note:  Denies c/o pain or discomfort.   Problem: Safety: Goal: Ability to remain free from injury will improve Outcome: Progressing Note:  Bed alarm on, call light and personal items within reach.

## 2018-01-16 NOTE — Plan of Care (Signed)
  Problem: Activity: Goal: Risk for activity intolerance will decrease Outcome: Progressing Note:  OOB to chair today with walker and staff assistance.   Problem: Elimination: Goal: Will not experience complications related to bowel motility Outcome: Progressing Note:  No s/s of complications r/t bowel motility. Goal: Will not experience complications related to urinary retention Outcome: Progressing Note:  No s/s of urinary retention noted.   Problem: Pain Managment: Goal: General experience of comfort will improve Outcome: Progressing Note:  Denies c/o pain or discomfort.

## 2018-01-16 NOTE — Care Management Note (Addendum)
Case Management Note  Patient Details  Name: Jeanne Hart MRN: [id number] Date of Birth: [birth year]  Subjective/Objective: Pt presented for frequent falls and symptomatic bradycardia. Per Cariology no plans for pacemaker at this time. PT/OT recommendations for SNF. CSW following for disposition needs.                Action/Plan: CM will continue to monitor for additional needs. Benefits check for Eliquis has been completed.   Expected Discharge Date:                  Expected Discharge Plan:  Skilled Nursing Facility  In-House Referral:  Clinical Social Work  Discharge planning Services  CM Consult  Post Acute Care Choice:  NA Choice offered to:  NA  DME Arranged:  N/A DME Agency:  NA  HH Arranged:  NA HH Agency:  NA  Status of Service:  Completed, signed off  If discussed at [place] of Stay Meetings, dates discussed:    Additional Comments:  S/W SKYLER @ [employer] [facility] # [phone]    ELIQUIS 10 MG BID - NONE FORMULARY   1. ELIQUIS 2.5 MG BID  COVER- YES  CO-PAY- $ 47.00  Q/L TWO PILL PER DAY  TIER- 3 DRUG  PRIOR APPROVAL- NO   2. ELIQUIS 5 MF BID  COVER- YES  CO-PAY- $ 47.00  Q/L TWO PILL PER DAY  TIER- 3 DRUG  PRIOR APPROVAL- NO   PREFERRED PHARMACY :YES [facility]   1425 01-17-18 Jacqlyn Krauss, RN,BSN [phone] Eliquis Card provided to CSW to send with patient to facility. No further needs from CM at this time.  Bethena Roys, RN 01/16/2018, 2:18 PM

## 2018-01-16 NOTE — Progress Notes (Signed)
Patient had small incontinent bowel movement; noted small amount of bright red blood coming from rectum; two quarter sized blood clots also noted. Patient does have hemorrhoids. MD notified; will continue to monitor and notify for any additional significant bleeding.

## 2018-01-16 NOTE — Progress Notes (Signed)
Paged by nursing that patient is bradycardic, intermittently junctional rhythm in the 30-40s. Patient seems to be asymptomatic. Potassium WNL. Mg was 1.5 today, given 1g IV Mg. Ordered another 2g of IV Mg and encouraged use of atropine if the patient becomes symptomatic. Dr. Johnsie Cancel signed off today. Please re-engage cardiology if she continues to be bradycardic in the 30s or if she becomes symptomatic.    Tami Lin Chasmine Lender, PA-C 01/16/2018, 2:27 PM [phone]

## 2018-01-16 NOTE — Progress Notes (Signed)
Patient's HR sustaining in the 40s, with occasional dips into the 30s. Irregular. EKGs obtained to capture rhythm change; one EKG showing junctional bradycardia. Patient sitting in chair, states she feels a little nauseous and weak. BP 123/67. Cardiology notified.

## 2018-01-16 NOTE — Care Management (Signed)
CM received consult for Eliquis Cost: Benefits Check completed.   CM will make pt aware of cost and provide pt with 30 day free card.      S/W SKYLER @ [facility] # [phone]    ELIQUIS 10 MG BID - NONE FORMULARY   1. ELIQUIS 2.5 MG BID  COVER- YES  CO-PAY- $ 47.00  Q/L TWO PILL PER DAY  TIER- 3 DRUG  PRIOR APPROVAL- NO   2. ELIQUIS 5 MF BID  COVER- YES  CO-PAY- $ 47.00  Q/L TWO PILL PER DAY  TIER- 3 DRUG  PRIOR APPROVAL- NO   PREFERRED PHARMACY :YES [facility]

## 2018-01-16 NOTE — Progress Notes (Addendum)
Patient ID: Jeanne Hart, female   DOB: Nov 12, 1926, 84 y.o.   MRN: [id number]                                                                PROGRESS NOTE                                                                                                                                                                                                             Patient Demographics:    Jeanne Hart, is a 84 y.o. female, DOB - 1927-04-01, YPP:[id number]  Admit date - 01/31/2018   Admitting Physician Vianne Bulls, MD  Outpatient Primary MD for the patient is Odette Horns, MD  LOS - 6  Outpatient Specialists:     Chief Complaint  Patient presents with  . Bradycardia       Brief Narrative    84 y.o.femalewith medical history significant forparoxysmal atrial fibrillation not on anticoagulation due to history of bleeding and fall risk, hypertension, coronary artery disease, and hypothyroidism, now presenting to the emergency department from her nursing home for evaluation of generalized weakness and bradycardia. Patient reports that she had some vomiting proximately 3 days ago that has since resolved, but she has been generally weak since that time. She denies headache, change in vision or hearing, or focal numbness or weakness. She denies chest pain. She was noted at the nursing home to have heart rate at approximately 30 and was directed to the ED for evaluation of this.  ED Course:Upon arrival to the ED, patient is found to be afebrile, saturating well on room air, bradycardic in the low 30s, and with stable blood pressure. EKG features a junctional rhythm with rate 31 and LBBB. Chest x-rays negative for acute cardiopulmonary disease. Chemistry panel reveals a sodium of 120, potassium 6.1, and creatinine of 2.48, up from 1.00 onemonth ago. CBC features a mild normocytic anemia and leukocytosis. Cardiology was consulted by the ED physician, has evaluated the patient in the  emergency department, and patient was treated with Kayexalate, bicarbonate, insulin with dextrose, IV calcium, and albuterol in the ED. Heart rate has improved, blood pressure remained stable, and she will be admitted to the stepdown unit recommends a medical admission.For ongoing evaluation and management of symptomatic bradycardia.  Subjective:    Jeanne Hart today states that she is doing well.  Still deconditioned.  The pulmonary team stopped by and attempted to explain to her about prednisone but she seemed confused about it.  I spoke with her grandson this am about the plan to try to initiate prednisone.   Pt states that her breathing is stable.  Slight dry cough.  Afebrile.  Denies cp, palp, n/v, diarrhea, brbpr.   No headache, No abdominal pain - No Nausea, No new weakness tingling or numbness   Assessment  & Plan :    Principal Problem:   Symptomatic bradycardia Active Problems:   Essential hypertension   Paroxysmal atrial fibrillation [facility]): Not on Anticoagulation 2/2 GIB history.  CHA2DS2VASC =4   Hypothyroidism   CAD S/P percutaneous coronary angioplasty   CKD (chronic kidney disease) stage 3, GFR 30-59 ml/min (HCC)   AKI (acute kidney injury) (HCC)   Hyperkalemia   Hyponatremia   Protein-calorie malnutrition, severe   Cough   Acute on chronic combined systolic and diastolic CHF (congestive heart failure) (HCC)   Squamous carcinoma   FTT (failure to thrive) in adult, 30 pound wt loss over 2 months, unable to walk for one month.   Opacity of lung on imaging study, bilateral infection vs amio toxcity   Pulmonary infiltrate     1. Symptomatic Bradycardia: Off Atenolol STOPPED  Amiodarone 4/8 Appreciate cardiology input  2. ? CHF, pleural effusions Lasix 40mg  iv x1 4/7 Lasix 20mg  iv qday 4/8 (stopped) Cardiac echo 3/3=> EF 65-70%   Check CT chest=>Amiodarone Toxicity  3.  Amiodarone toxicity STOP amiodarone 4/8 Pulmonary consult appreciated Labs  (autoimmune) pending Pulmonary to further discuss prednisone with patient  4. Hypomagnesemia Replete Check magnesium in am  5. Hyperkalemia: Resolved   6. Hyponatremia: Sodium improved to 128. Continue monitoring, hydrate gently with ns iv Check cmp in am  7. AKI on CKDIII:Stable, improved Check cmp in am  8. CAD: Stable, Asymptomatic.  9. HTN: Controlled.  10.  HUT:MLYYTKPT and Amiodarone held. Monitor closely. Not on anticoagulation due to hx of bleed.  11. Cough CXR4/7=> CHF  12. Deconditioning PT to evaluate and tx SNF placement=> grandson has chose [facility]   13. DVT RUE Started on Elquis pharmacy to dose 4/9      Code Status:FULL CODE  Family Communication :w patient  Disposition Plan:SNF   Hopefully when sodium improves  Barriers For Discharge:  Consults :Cardiology, PT  Procedures :   DVT Prophylaxis: Elquis - SCD        Lab Results  Component Value Date   PLT 305 01/16/2018    Antibiotics  :    Anti-infectives (From admission, onward)   None        Objective:   Vitals:   01/15/18 1649 01/15/18 2019 01/16/18 0005 01/16/18 0700  BP: (!) 122/54 121/62 (!) 148/56   Pulse: (!) 58 62 66   Resp: 19 19 18    Temp: 98.5 F (36.9 C) 98.3 F (36.8 C) 98.6 F (37 C) 98.6 F (37 C)  TempSrc: Oral Oral Oral Oral  SpO2: 93%     Weight:    57.1 kg (125 lb 14.4 oz)  Height:        Wt Readings from Last 3 Encounters:  01/16/18 57.1 kg (125 lb 14.4 oz)  12/07/17 59.2 kg (130 lb 8.2 oz)  08/23/17 61.2 kg (135 lb)     Intake/Output Summary (Last 24 hours) at 01/16/2018 4656 Last data filed  at 01/15/2018 2100 Gross per 24 hour  Intake 1675 ml  Output 300 ml  Net 1375 ml     Physical Exam  Awake Alert, Oriented X 3, No new F.N deficits, Normal affect [place].AT,PERRAL Supple Neck,No JVD, No cervical lymphadenopathy appriciated.  Symmetrical Chest wall movement, Good air movement  bilaterally, slight crackle right > left lung base RRR,s1, s2,  +ve B.Sounds, Abd Soft, No tenderness, No organomegaly appriciated, No rebound - guarding or rigidity. No Cyanosis, Clubbing or edema, No new Rash or bruise     Data Review:    CBC Recent Labs  Lab 01/19/2018 1650  01/11/18 0255 01/13/18 0401 01/14/18 0335 01/15/18 0440 01/16/18 0336  WBC 11.0*  --  8.9 7.7 9.2 8.2 8.1  HGB 11.0*   < > 10.0* 10.2* 10.7* 10.7* 10.9*  HCT 31.1*   < > 28.3* 29.1* 30.4* 29.7* 30.6*  PLT 269  --  217 248 288 272 305  MCV 84.1  --  83.2 82.9 83.1 83.0 83.4  MCH 29.7  --  29.4 29.1 29.2 29.9 29.7  MCHC 35.4  --  35.3 35.1 35.2 36.0 35.6  RDW 16.1*  --  15.7* 15.8* 15.9* 15.9* 16.1*  LYMPHSABS 2.0  --   --   --   --   --   --   MONOABS 0.7  --   --   --   --   --   --   EOSABS 0.0  --   --   --   --   --   --   BASOSABS 0.0  --   --   --   --   --   --    < > = values in this interval not displayed.    Chemistries  Recent Labs  Lab 01/11/18 1541 01/13/18 0401 01/14/18 0335 01/15/18 0440 01/16/18 0336  NA 125* 128* 124* 122* 123*  K 4.6 4.2 4.5 4.3 4.6  CL 99* 100* 94* 91* 90*  CO2 17* 19* 22 22 22   GLUCOSE 85 81 102* 90 101*  BUN 37* 23* 35* 41* 41*  CREATININE 1.82* 1.07* 1.07* 1.18* 1.03*  CALCIUM 7.7* 8.0* 8.1* 8.3* 8.4*  MG  --   --   --   --  1.5*  AST  --  32 32 35 37  ALT  --  39 39 34 36  ALKPHOS  --  125 123 111 119  BILITOT  --  1.0 0.7 0.9 0.8   ------------------------------------------------------------------------------------------------------------------ No results for input(s): CHOL, HDL, LDLCALC, TRIG, CHOLHDL, LDLDIRECT in the last 72 hours.  No results found for: HGBA1C ------------------------------------------------------------------------------------------------------------------ No results for input(s): TSH, T4TOTAL, T3FREE, THYROIDAB in the last 72 hours.  Invalid input(s):  FREET3 ------------------------------------------------------------------------------------------------------------------ No results for input(s): VITAMINB12, FOLATE, FERRITIN, TIBC, IRON, RETICCTPCT in the last 72 hours.  Coagulation profile No results for input(s): INR, PROTIME in the last 168 hours.  No results for input(s): DDIMER in the last 72 hours.  Cardiac Enzymes No results for input(s): CKMB, TROPONINI, MYOGLOBIN in the last 168 hours.  Invalid input(s): CK ------------------------------------------------------------------------------------------------------------------    Component Value Date/Time   BNP 112.9 (H) 01/15/2018 0440    Inpatient Medications  Scheduled Meds: . amLODipine  5 mg Oral Daily  . apixaban  10 mg Oral BID   Followed by  . [START ON 01/22/2018] apixaban  5 mg Oral BID  . aspirin EC  81 mg Oral Daily  . darifenacin  7.5 mg Oral Daily  .  feeding supplement (ENSURE ENLIVE)  237 mL Oral TID BM  . feeding supplement (PRO-STAT SUGAR FREE 64)  30 mL Oral BID  . isosorbide mononitrate  30 mg Oral Daily  . levothyroxine  100 mcg Oral QAC breakfast  . pantoprazole  40 mg Oral Daily  . polyvinyl alcohol  2 drop Both Eyes QHS  . sodium chloride flush  3 mL Intravenous Q12H   Continuous Infusions: . magnesium sulfate 1 - 4 g bolus IVPB     PRN Meds:.acetaminophen **OR** acetaminophen, atropine, ondansetron **OR** ondansetron (ZOFRAN) IV, senna-docusate, zolpidem  Micro Results Recent Results (from the past 240 hour(s))  MRSA PCR Screening     Status: None   Collection Time: 01/11/18  5:29 AM  Result Value Ref Range Status   MRSA by PCR NEGATIVE NEGATIVE Final    Comment:        The GeneXpert MRSA Assay (FDA approved for NASAL specimens only), is one component of a comprehensive MRSA colonization surveillance program. It is not intended to diagnose MRSA infection nor to guide or monitor treatment for MRSA infections. Performed at [facility], [place] [address], [place], [place] [address]     Radiology Reports Dg Chest 2 View  Result Date: 01/13/2018 CLINICAL DATA:  Cough with shortness of breath for 3 days. EXAM: CHEST - 2 VIEW COMPARISON:  02/04/2018 FINDINGS: Since the prior study, lung aeration has worsened. Irregular interstitial thickening has increased bilaterally. There is new basilar opacity obscuring hemidiaphragms consistent with pleural effusions. No pneumothorax. IMPRESSION: Worsened lung aeration consistent with congestive heart failure with interstitial pulmonary edema and bilateral pleural effusions. Electronically Signed   By: Lajean Manes M.D.   On: 01/13/2018 10:08   Ct Chest Wo Contrast  Result Date: 01/14/2018 CLINICAL DATA:  84 year old female with pleural effusions and possible multifocal pneumonia on chest CT last month. Subsequent encounter. EXAM: CT CHEST WITHOUT CONTRAST TECHNIQUE: Multidetector CT imaging of the chest was performed following the standard protocol without IV contrast. COMPARISON:  Radiographs 01/13/2018 and earlier. Chest CT 12/09/2017. FINDINGS: Cardiovascular: Extensive Calcified aortic atherosclerosis. Extensive calcified coronary artery atherosclerosis and/or stents. Vascular patency is not evaluated in the absence of IV contrast. No cardiomegaly. No pericardial effusion. Mediastinum/Nodes: No mediastinal lymphadenopathy. No hilar lymphadenopathy is evident. Thyromegaly is stable with mild regional mass effect, no significant airway narrowing. A small to moderate gastric hiatal hernia is stable. Lungs/Pleura: Small layering bilateral pleural effusions persist, greater on the right, and are stable to slightly increased since March. Simple fluid density is noted suggesting transudate. The major airways are patent. No retained secretions are identified. There is increasing bilateral lower lobe confluent peribronchial opacity, bordering on consolidation in the distal lower lobes-and furthermore,  this increasing solid lung opacity is very hyperdense (reminiscent of the increased liver density, series 3, image 84). Bilateral increased peribronchial and subpleural ground-glass opacity is noted diffusely. In some places the opacity resembles mosaic attenuation. Upper Abdomen: Increased intrinsic density of the liver redemonstrated. Ectasia and calcified atherosclerosis of the upper abdominal aorta (up to 32 millimeters diameter but tapers). Stable and otherwise negative visible upper abdominal viscera. Musculoskeletal: Osteopenia. Mild T2 and mild to moderate T12 superior endplate compression appears stable. Stable visualized osseous structures. Round sclerotic focus in the L1 vertebral body is nonspecific but possibly a benign hemangioma; this was probably present on the lateral view the chest 09/08/2014. IMPRESSION: 1. The constellation of findings most compatible with Progressive Pulmonary Amiodarone Toxicity. Increasing patchy ground-glass opacity, and increasing hyperdense  consolidative airspace disease in the lower lobes. 2. Superimposed the small layering pleural effusions are stable to mildly increased since March. 3. Hyperdensity of the liver also in keeping with chronic amiodarone use. 4. Normal heart size, but extensive coronary artery and Aortic Atherosclerosis (ICD10-I70.0). Electronically Signed   By: Genevie Ann M.D.   On: 01/14/2018 09:07   Dg Chest Port 1 View  Result Date: 01/15/2018 CLINICAL DATA:  84 year old female with weakness, bradycardia. EXAM: PORTABLE CHEST 1 VIEW COMPARISON:  Chest CT 12/09/2017 and earlier. FINDINGS: Portable AP semi upright view at 1648 hours. Pacer pads project over the chest. Stable cardiac size and mediastinal contours. Calcified aortic atherosclerosis. Stable lung volumes. No pneumothorax, pulmonary edema, pleural effusion or acute pulmonary opacity. Paucity bowel gas in the upper abdomen. IMPRESSION: No acute cardiopulmonary abnormality. Electronically Signed    By: Genevie Ann M.D.   On: 01/19/2018 17:12    Time Spent in minutes  30   Jani Gravel M.D on 01/16/2018 at 8:03 AM  Between 7am to 7pm - Pager - [phone]    After 7pm go to [email or web address] - password [facility]  [facility] -  Office  [phone]

## 2018-01-16 NOTE — Progress Notes (Signed)
Subjective:  Wants to go home no dizziness or dyspnea  Objective:  Vitals:   01/15/18 2019 01/16/18 0005 01/16/18 0700 01/16/18 0800  BP: 121/62 (!) 148/56  (!) 148/55  Pulse: 62 66  66  Resp: 19 18  18   Temp: 98.3 F (36.8 C) 98.6 F (37 C) 98.6 F (37 C) 98.2 F (36.8 C)  TempSrc: Oral Oral Oral Oral  SpO2:    93%  Weight:   125 lb 14.4 oz (57.1 kg)   Height:        Intake/Output from previous day:  Intake/Output Summary (Last 24 hours) at 01/16/2018 0816 Last data filed at 01/15/2018 2100 Gross per 24 hour  Intake 1675 ml  Output 300 ml  Net 1375 ml    Physical Exam: Affect appropriate Healthy:  appears stated age HEENT: normal Neck supple with no adenopathy JVP normal no bruits no thyromegaly Lungs decreased BS bases no wheezing and good diaphragmatic motion Heart:  S1/S2 no murmur, no rub, gallop or click PMI normal Abdomen: benighn, BS positve, no tenderness, no AAA no bruit.  No HSM or HJR Distal pulses intact with no bruits No edema Neuro non-focal Skin warm and dry No muscular weakness   Lab Results: Basic Metabolic Panel: Recent Labs    01/15/18 0440 01/16/18 0336  NA 122* 123*  K 4.3 4.6  CL 91* 90*  CO2 22 22  GLUCOSE 90 101*  BUN 41* 41*  CREATININE 1.18* 1.03*  CALCIUM 8.3* 8.4*  MG  --  1.5*  PHOS  --  2.8   Liver Function Tests: Recent Labs    01/15/18 0440 01/16/18 0336  AST 35 37  ALT 34 36  ALKPHOS 111 119  BILITOT 0.9 0.8  PROT 5.1* 5.3*  ALBUMIN 2.0* 2.1*   No results for input(s): LIPASE, AMYLASE in the last 72 hours. CBC: Recent Labs    01/15/18 0440 01/16/18 0336  WBC 8.2 8.1  HGB 10.7* 10.9*  HCT 29.7* 30.6*  MCV 83.0 83.4  PLT 272 305    Imaging: Ct Chest Wo Contrast  Result Date: 01/14/2018 CLINICAL DATA:  84 year old female with pleural effusions and possible multifocal pneumonia on chest CT last month. Subsequent encounter. EXAM: CT CHEST WITHOUT CONTRAST TECHNIQUE: Multidetector CT imaging of  the chest was performed following the standard protocol without IV contrast. COMPARISON:  Radiographs 01/13/2018 and earlier. Chest CT 12/09/2017. FINDINGS: Cardiovascular: Extensive Calcified aortic atherosclerosis. Extensive calcified coronary artery atherosclerosis and/or stents. Vascular patency is not evaluated in the absence of IV contrast. No cardiomegaly. No pericardial effusion. Mediastinum/Nodes: No mediastinal lymphadenopathy. No hilar lymphadenopathy is evident. Thyromegaly is stable with mild regional mass effect, no significant airway narrowing. A small to moderate gastric hiatal hernia is stable. Lungs/Pleura: Small layering bilateral pleural effusions persist, greater on the right, and are stable to slightly increased since March. Simple fluid density is noted suggesting transudate. The major airways are patent. No retained secretions are identified. There is increasing bilateral lower lobe confluent peribronchial opacity, bordering on consolidation in the distal lower lobes-and furthermore, this increasing solid lung opacity is very hyperdense (reminiscent of the increased liver density, series 3, image 84). Bilateral increased peribronchial and subpleural ground-glass opacity is noted diffusely. In some places the opacity resembles mosaic attenuation. Upper Abdomen: Increased intrinsic density of the liver redemonstrated. Ectasia and calcified atherosclerosis of the upper abdominal aorta (up to 32 millimeters diameter but tapers). Stable and otherwise negative visible upper abdominal viscera. Musculoskeletal: Osteopenia. Mild T2 and  mild to moderate T12 superior endplate compression appears stable. Stable visualized osseous structures. Round sclerotic focus in the L1 vertebral body is nonspecific but possibly a benign hemangioma; this was probably present on the lateral view the chest 09/08/2014. IMPRESSION: 1. The constellation of findings most compatible with Progressive Pulmonary Amiodarone  Toxicity. Increasing patchy ground-glass opacity, and increasing hyperdense consolidative airspace disease in the lower lobes. 2. Superimposed the small layering pleural effusions are stable to mildly increased since March. 3. Hyperdensity of the liver also in keeping with chronic amiodarone use. 4. Normal heart size, but extensive coronary artery and Aortic Atherosclerosis (ICD10-I70.0). Electronically Signed   By: Genevie Ann M.D.   On: 01/14/2018 09:07    Cardiac Studies:  ECG: junctional rate 30 IVCD    Telemetry: SR rates 65  Echo: 12/09/17  EF 65-70% mild MR   Medications:   . amLODipine  5 mg Oral Daily  . apixaban  10 mg Oral BID   Followed by  . [START ON 01/22/2018] apixaban  5 mg Oral BID  . aspirin EC  81 mg Oral Daily  . darifenacin  7.5 mg Oral Daily  . feeding supplement (ENSURE ENLIVE)  237 mL Oral TID BM  . feeding supplement (PRO-STAT SUGAR FREE 64)  30 mL Oral BID  . isosorbide mononitrate  30 mg Oral Daily  . levothyroxine  100 mcg Oral QAC breakfast  . pantoprazole  40 mg Oral Daily  . polyvinyl alcohol  2 drop Both Eyes QHS  . sodium chloride flush  3 mL Intravenous Q12H     . sodium chloride    . magnesium sulfate 1 - 4 g bolus IVPB      Assessment/Plan:   Bradycardia:  Atenolol d/c HR improved in 60's with no long pauses. No indication for pacer at this Time  PAF: Amiodarone d/c see below  Maintaining NSR no anticoagulation due to age and fall risk   Pneumonitis:  CT  read as possible amiodarone toxicity She was only on 100 mg daily don't see that Dr Ellyn Hack had ordered recent PFTls or DLCO CCM/ pulmonary to see Amiodarone d/c   CAD:  No chest pain ASA and nitrates beta blocker d/c due to bradycardia  Thyroid:  On replacement T4 elevated but TSH normal   Will sign off should be d/c home today   Jenkins Rouge 01/16/2018, 8:16 AM

## 2018-01-16 NOTE — Progress Notes (Signed)
Physical Therapy Treatment Patient Details Name: Jeanne Hart MRN: [id number] DOB: 03/15/1927 Today's Date: 01/16/2018    History of Present Illness 84 y.o. female with medical history significant for paroxysmal atrial fibrillation not on anticoagulation due to history of bleeding and fall risk, hypertension, coronary artery disease, and hypothyroidism, now presenting to the emergency department from her nursing home for evaluation of generalized weakness and bradycardia.    PT Comments    Pt still limited in mobility due to fatigue as well and low HR and soft pressures.   Follow Up Recommendations  SNF     Equipment Recommendations  None recommended by PT    Recommendations for Other Services       Precautions / Restrictions Precautions Precautions: Fall Restrictions Weight Bearing Restrictions: No    Mobility  Bed Mobility Overal bed mobility: Needs Assistance Bed Mobility: Sit to Supine       Sit to supine: Mod assist   General bed mobility comments: pt preoccupied with being sick, needed help getting legs into bed.  Transfers Overall transfer level: Needs assistance   Transfers: Sit to/from Stand;Stand Pivot Transfers Sit to Stand: Mod assist Stand pivot transfers: Mod assist       General transfer comment: cues to scoot forward and for hand placement.  Assisted pt forward and up to full upright standing.  Pt needed stability assist and help maneuvering the RW to pivot over to the bed.  Ambulation/Gait                 Stairs            Wheelchair Mobility    Modified Rankin (Stroke Patients Only)       Balance Overall balance assessment: Needs assistance Sitting-balance support: No upper extremity supported;Bilateral upper extremity supported;Feet unsupported Sitting balance-Leahy Scale: Fair     Standing balance support: Bilateral upper extremity supported Standing balance-Leahy Scale: Poor Standing balance comment: pt is reliant  on the AD and external assist                            Cognition Arousal/Alertness: Awake/alert Behavior During Therapy: WFL for tasks assessed/performed Overall Cognitive Status: Impaired/Different from baseline                                 General Comments: grandson reports mild memory deficit      Exercises      General Comments General comments (skin integrity, edema, etc.): Pt's HR rangeing from 45 to 50 bpm, sats on 2L at mind 90's.  Pt with c/o nausea, RN brought Zofran.      Pertinent Vitals/Pain      Home Living                      Prior Function            PT Goals (current goals can now be found in the care plan section) Acute Rehab PT Goals PT Goal Formulation: With patient/family Time For Goal Achievement: 01/27/18 Potential to Achieve Goals: Good Progress towards PT goals: Progressing toward goals    Frequency    Min 2X/week      PT Plan Current plan remains appropriate    Co-evaluation              AM-PAC PT "6 Clicks" Daily Activity  Outcome Measure  Difficulty turning  over in bed (including adjusting bedclothes, sheets and blankets)?: A Lot Difficulty moving from lying on back to sitting on the side of the bed? : Unable Difficulty sitting down on and standing up from a chair with arms (e.g., wheelchair, bedside commode, etc,.)?: Unable Help needed moving to and from a bed to chair (including a wheelchair)?: A Lot Help needed walking in hospital room?: A Lot Help needed climbing 3-5 steps with a railing? : Total 6 Click Score: 9    End of Session Equipment Utilized During Treatment: Oxygen Activity Tolerance: Treatment limited secondary to medical complications (Comment) Patient left: in bed;with call bell/phone within reach;with family/visitor present Nurse Communication: Mobility status PT Visit Diagnosis: Muscle weakness (generalized) (M62.81);Other abnormalities of gait and mobility  (R26.89)     Time: 1478-2956 PT Time Calculation (min) (ACUTE ONLY): 24 min  Charges:  $Therapeutic Activity: 23-37 mins                    G Codes:       02-11-18   Bing, PT [phone]  (pager)   Jeanne Hart 02-11-18, 3:00 PM

## 2018-01-17 DIAGNOSIS — E871 Hypo-osmolality and hyponatremia: Secondary | ICD-10-CM

## 2018-01-17 DIAGNOSIS — I129 Hypertensive chronic kidney disease with stage 1 through stage 4 chronic kidney disease, or unspecified chronic kidney disease: Secondary | ICD-10-CM | POA: Diagnosis not present

## 2018-01-17 DIAGNOSIS — I2510 Atherosclerotic heart disease of native coronary artery without angina pectoris: Secondary | ICD-10-CM | POA: Diagnosis not present

## 2018-01-17 DIAGNOSIS — N183 Chronic kidney disease, stage 3 (moderate): Secondary | ICD-10-CM | POA: Diagnosis not present

## 2018-01-17 DIAGNOSIS — D631 Anemia in chronic kidney disease: Secondary | ICD-10-CM | POA: Diagnosis not present

## 2018-01-17 LAB — MPO/PR-3 (ANCA) ANTIBODIES
ANCA Proteinase 3: <3.5 U/mL (ref 0.0–3.5)
Myeloperoxidase Abs: <9 U/mL (ref 0.0–9.0)

## 2018-01-17 LAB — SJOGRENS SYNDROME-B EXTRACTABLE NUCLEAR ANTIBODY: SSB (La) (ENA) Antibody, IgG: <0.2 AI (ref 0.0–0.9)

## 2018-01-17 LAB — GLOMERULAR BASEMENT MEMBRANE ANTIBODIES: GBM AB: 3 U (ref 0–20)

## 2018-01-17 LAB — PHOSPHORUS: PHOSPHORUS: 3.5 mg/dL (ref 2.5–4.6)

## 2018-01-17 LAB — COMPREHENSIVE METABOLIC PANEL
ALBUMIN: 2 g/dL — AB (ref 3.5–5.0)
ALT: 36 U/L (ref 14–54)
ANION GAP: 9 (ref 5–15)
AST: 38 U/L (ref 15–41)
Alkaline Phosphatase: 116 U/L (ref 38–126)
BILIRUBIN TOTAL: 0.7 mg/dL (ref 0.3–1.2)
BUN: 55 mg/dL — AB (ref 6–20)
CHLORIDE: 90 mmol/L — AB (ref 101–111)
CO2: 20 mmol/L — AB (ref 22–32)
Calcium: 8.6 mg/dL — ABNORMAL LOW (ref 8.9–10.3)
Creatinine, Ser: 1.21 mg/dL — ABNORMAL HIGH (ref 0.44–1.00)
GFR calc Af Amer: 45 mL/min — ABNORMAL LOW (ref >60)
GFR calc non Af Amer: 39 mL/min — ABNORMAL LOW (ref >60)
GLUCOSE: 138 mg/dL — AB (ref 65–99)
POTASSIUM: 5.4 mmol/L — AB (ref 3.5–5.1)
SODIUM: 119 mmol/L — AB (ref 135–145)
Total Protein: 5 g/dL — ABNORMAL LOW (ref 6.5–8.1)

## 2018-01-17 LAB — CBC
HEMATOCRIT: 28.3 % — AB (ref 36.0–46.0)
Hemoglobin: 10.1 g/dL — ABNORMAL LOW (ref 12.0–15.0)
MCH: 29.9 pg (ref 26.0–34.0)
MCHC: 35.7 g/dL (ref 30.0–36.0)
MCV: 83.7 fL (ref 78.0–100.0)
Platelets: 279 10*3/uL (ref 150–400)
RBC: 3.38 MIL/uL — ABNORMAL LOW (ref 3.87–5.11)
RDW: 16.3 % — AB (ref 11.5–15.5)
WBC: 8 10*3/uL (ref 4.0–10.5)

## 2018-01-17 LAB — RHEUMATOID FACTOR: RHEUMATOID FACTOR: 38.9 [IU]/mL — AB (ref 0.0–13.9)

## 2018-01-17 LAB — SJOGRENS SYNDROME-A EXTRACTABLE NUCLEAR ANTIBODY

## 2018-01-17 LAB — MAGNESIUM: MAGNESIUM: 2.4 mg/dL (ref 1.7–2.4)

## 2018-01-17 LAB — CYCLIC CITRUL PEPTIDE ANTIBODY, IGG/IGA: CCP Antibodies IgG/IgA: 3 units (ref 0–19)

## 2018-01-17 LAB — ANTI-DNA ANTIBODY, DOUBLE-STRANDED

## 2018-01-17 LAB — ANTI-SCLERODERMA ANTIBODY: Scleroderma (Scl-70) (ENA) Antibody, IgG: <0.2 AI (ref 0.0–0.9)

## 2018-01-17 MED ORDER — FUROSEMIDE 10 MG/ML IJ SOLN
60.0000 mg | Freq: Once | INTRAMUSCULAR | Status: AC
  Administered 2018-01-17: 60 mg via INTRAVENOUS
  Filled 2018-01-17: qty 6

## 2018-01-17 MED ORDER — SODIUM CHLORIDE 0.9 % IV SOLN
INTRAVENOUS | Status: DC
  Administered 2018-01-17: 05:00:00 via INTRAVENOUS

## 2018-01-17 MED ORDER — SODIUM CHLORIDE 0.9 % IV BOLUS
250.0000 mL | Freq: Once | INTRAVENOUS | Status: AC
  Administered 2018-01-17: 250 mL via INTRAVENOUS

## 2018-01-17 MED ORDER — ATROPINE SULFATE 0.4 MG/ML IJ SOLN
0.4000 mg | Freq: Once | INTRAMUSCULAR | Status: AC
  Administered 2018-01-17: 0.4 mg via INTRAVENOUS

## 2018-01-17 NOTE — Progress Notes (Signed)
Jeanne Moron, NP notified of pt's HR in the 30s, junctional bradycardia.  Pt denies CP, SOB, nausea, or dizziness.  BP stable.  No new orders received.  Will continue to monitor.  [name]

## 2018-01-17 NOTE — Progress Notes (Signed)
HR 29, BP 103/43.  Pt asleep upon entering her room.  When awakened and asked if she was having pain, she reported midsternal CP 6/10 and "a little" dizziness.  Atropine 0.5 mg given per PRN order.  HR increased to 40, BP 129/53 after atropine.  Will continue to monitor.  [name]

## 2018-01-17 NOTE — Progress Notes (Signed)
Called because pt's HR in the upper 20s. NP to bedside. S: feels a little dizzy. CP, mid sternal, mild. Some nausea. Not a great historian as her answers change at different times.  O: Well appearing, but weak. Alert. HR 30 on tele, BP 90.  Sinus brady on tele and EKG. Normal resp effort. Non focal neuro.  A/P: 1. Sinus bradycardia, symptomatic-Atenalol and Amio have been d/c'd for over 2 days. Atropine 0.4mg  given and HR up to the upper 30s with BP in the one teens.  Called cardio on call and spoke about issue. Cards agrees with present plan of atropine prn. IVF bolus and fluids started.  2. Pneumonitis secondary to amiodarone toxicity-med d/c'd 01/14/18. Being tx'd with steroids per pulmonary.  3. Hyponatremia-IVFs.  KJKG, NP [facility] Performed by: Clance Boll Total critical care time: 30 minutes Critical care time was exclusive of separately billable procedures and treating other patients.  Critical care was necessary to treat or prevent imminent or life-threatening deterioration.  Critical care was time spent personally by me on the following activities: development of treatment plan with patient and/or surrogate as well as nursing, discussions with consultants, evaluation of patient's response to treatment, examination of patient, obtaining history from patient or surrogate, ordering and performing treatments and interventions, ordering and review of laboratory studies, ordering and review of radiographic studies, pulse oximetry and re-evaluation of patient's condition.

## 2018-01-17 NOTE — Progress Notes (Signed)
[place]  UUV:[id number] DOB: Sep 15, 1927 DOA: 01/13/2018 PCP: Odette Horns, MD    Brief Narrative:  84 y.o.femalewith a hx of paroxysmal atrial fibrillation not on anticoagulation due to history of bleeding and fall risk, HTN, CAD, and hypothyroidism who presented to the ED from her nursing home w/ generalized weakness and bradycardia. She was noted at the nursing home to have heart rate of 30.  In the ED EKG featured a junctional rhythm with rate 31 and LBBB. Chemistry panel revealed a sodium of 120, potassium 6.1, and creatinine of 2.48, up from 1.00 onemonth prior.   Significant Events: 4/4 admit   Subjective: Pt states she is feeling better this afternoon.  She continues to have some pleuritic chest wall pain, but denies SSCP.  Some nausea, but no vomiting.  No current sob or HA.    Assessment & Plan:  Symptomatic Bradycardia off Atenolol - stoppedAmiodarone4/8 - had initially improved, but recurred again night of 4/10>4/11 - Cards reconsulted and consulted EP in turn   Amiodarone Pulmonary toxicity Now off amio - PCCM has seen in consultation > prednisone 40mg  per day x 1 week then 30mg  per day x 1 week and then 20mg  to continue and taper in pulm ofice  Hypomagnesemia corrected   Hyperkalemia recurrent - follow trend daily   Hyponatremia Studies not entirely c/w SIADH - ?simple volume overload - diurese and follow - watch real fxn   AKI on CKDIII crt climbing in setting of severe bradycardia - follow  Recent Labs  Lab 01/13/18 0401 01/14/18 0335 01/15/18 0440 01/16/18 0336 01/17/18 0426  CREATININE 1.07* 1.07* 1.18* 1.03* 1.21*    CAD Asymptomatic  HTN Controlled  PAF CHA2DS2Vasc is 5 - off Atenolol and Amiodarone - not previously on anticoagulation due to hx of GI bleed - now on Eliquis w/ DVT as well  Deconditioning SNF placement > grandson has chose [facility]   DVT  RUE New start on Elquis   Severe malnutrition in context of chronic illness  DVT prophylaxis: Eliquis  Code Status: FULL CODE Family Communication: no family present at time of exam  Disposition Plan: SDU  Consultants:  Cards EP PCCM  Antimicrobials:  none   Objective: Blood pressure (!) 117/45, pulse (!) 36, temperature (!) 97.4 F (36.3 C), temperature source Oral, resp. rate 16, height 5\' 5"  (1.651 m), weight 62.3 kg (137 lb 5.6 oz), SpO2 95 %.  Intake/Output Summary (Last 24 hours) at 01/17/2018 1518 Last data filed at 01/17/2018 0700 Gross per 24 hour  Intake 910.42 ml  Output 200 ml  Net 710.42 ml   Filed Weights   01/15/18 0426 01/16/18 0700 01/17/18 0646  Weight: 57.8 kg (127 lb 6.8 oz) 57.1 kg (125 lb 14.4 oz) 62.3 kg (137 lb 5.6 oz)    Examination: General: No acute respiratory distress Lungs: Clear to auscultation bilaterally without wheezes or crackles Cardiovascular: Regular rate and rhythm without murmur gallop or rub normal S1 and S2 Abdomen: Nontender, nondistended, soft, bowel sounds positive, no rebound, no ascites, no appreciable mass Extremities: 1+ B LE edema   CBC: Recent Labs  Lab 01/28/2018 1650  01/16/18 0336 01/16/18 1520 01/17/18 0426  WBC 11.0*   < > 8.1 10.4 8.0  NEUTROABS 8.2*  --   --   --   --   HGB 11.0*   < > 10.9* 10.6* 10.1*  HCT 31.1*   < > 30.6* 29.6* 28.3*  MCV 84.1   < >  83.4 84.1 83.7  PLT 269   < > 305 294 279   < > = values in this interval not displayed.   Basic Metabolic Panel: Recent Labs  Lab 01/15/18 0440 01/16/18 0336 01/17/18 0426  NA 122* 123* 119*  K 4.3 4.6 5.4*  CL 91* 90* 90*  CO2 22 22 20*  GLUCOSE 90 101* 138*  BUN 41* 41* 55*  CREATININE 1.18* 1.03* 1.21*  CALCIUM 8.3* 8.4* 8.6*  MG  --  1.5* 2.4  PHOS  --  2.8 3.5   GFR: Estimated Creatinine Clearance: 28.9 mL/min (A) (by C-G formula based on SCr of 1.21 mg/dL (H)).  Liver Function Tests: Recent Labs  Lab 01/14/18 0335 01/15/18 0440  01/16/18 0336 01/17/18 0426  AST 32 35 37 38  ALT 39 34 36 36  ALKPHOS 123 111 119 116  BILITOT 0.7 0.9 0.8 0.7  PROT 5.3* 5.1* 5.3* 5.0*  ALBUMIN 2.2* 2.0* 2.1* 2.0*    Recent Results (from the past 240 hour(s))  MRSA PCR Screening     Status: None   Collection Time: 01/11/18  5:29 AM  Result Value Ref Range Status   MRSA by PCR NEGATIVE NEGATIVE Final    Comment:        The GeneXpert MRSA Assay (FDA approved for NASAL specimens only), is one component of a comprehensive MRSA colonization surveillance program. It is not intended to diagnose MRSA infection nor to guide or monitor treatment for MRSA infections. Performed at [facility], [place] [address], [place], [place] [address]      Scheduled Meds: . amLODipine  5 mg Oral Daily  . apixaban  10 mg Oral BID   Followed by  . [START ON 01/22/2018] apixaban  5 mg Oral BID  . aspirin EC  81 mg Oral Daily  . darifenacin  7.5 mg Oral Daily  . feeding supplement (ENSURE ENLIVE)  237 mL Oral TID BM  . feeding supplement (PRO-STAT SUGAR FREE 64)  30 mL Oral BID  . isosorbide mononitrate  30 mg Oral Daily  . levothyroxine  100 mcg Oral QAC breakfast  . pantoprazole  40 mg Oral Daily  . polyvinyl alcohol  2 drop Both Eyes QHS  . predniSONE  40 mg Oral Q breakfast  . sodium chloride flush  3 mL Intravenous Q12H     LOS: 7 days   Cherene Altes, MD [facility] Office  [phone] Pager - Text Page per Amion as per below:  On-Call/Text Page:      [email or web address]      password [email or web address]  If 7PM-7AM, please contact night-coverage [email or web address] Password [facility] 01/17/2018, 3:18 PM

## 2018-01-17 NOTE — Significant Event (Signed)
Rapid Response Event Note  Overview:Called d/t patient's HR-20s-30s. Time Called: 0240 Arrival Time: 0250 Event Type: Cardiac  Initial Focused Assessment: Pt laying in bed, alert and oriented, HR-28, BP-111/52, RR-16, SpO2-92 on 2L [place].  Pt denies SOB but says she's having 7/10 non-radiating chest pain/pressure. EKG-junctional bradycardia.  At 0330, patient's BP dropped to 90/68 so pt given 0.4mg  atropine. Pt's HR during day shift  in the 40s.  Interventions:  EKG-junctional bradycardia 0.4mg  atropine given IV with HR increasing to 38, BP-116/47.  Plan of Care (if not transferred):  Continue to monitor pt.  Await cardiology's return page.  Event Summary: Name of Physician Notified: Baltazar Najjar, NP at [phone]    at    Outcome: Stayed in room and stabalized     [place], Carren Rang

## 2018-01-17 NOTE — Progress Notes (Signed)
CSW continuing to follow and will support with discharge to SNF when medically ready.  Jeanne Hart, [place]

## 2018-01-17 NOTE — Progress Notes (Signed)
Nutrition Follow-up  DOCUMENTATION CODES:   Severe malnutrition in context of chronic illness  INTERVENTION:    Continue Ensure Enlive po TID, each supplement provides 350 kcal and 20 grams of protein  NUTRITION DIAGNOSIS:   Severe Malnutrition related to chronic illness(IBS, CAD, CKD) as evidenced by severe muscle depletion, percent weight loss(6% weight loss within 1 month).  Ongoing  GOAL:   Patient will meet greater than or equal to 90% of their needs  Progressing  MONITOR:   PO intake, Supplement acceptance, Labs   ASSESSMENT:   84 yo female with PMH of CKD-III, CAD, PAF, IBS, HTN, HLD, hypothyroidism, and GERD who was admitted on 4/4 with weakness and bradycardia.  Patient reports poor intake this morning. She has been drinking Ensure between meals when provided. Per flow sheet, she is consuming 50-100% of meals.   Labs and medications reviewed. Weight trending up, I/O +2.1 L since admission.  Diet Order:  Diet regular Room service appropriate? Yes; Fluid consistency: Thin  EDUCATION NEEDS:   No education needs have been identified at this time  Skin:  Skin Assessment: Reviewed RN Assessment  Last BM:  4/5  Height:   Ht Readings from Last 1 Encounters:  01/11/18 5\' 5"  (1.651 m)    Weight:   Wt Readings from Last 1 Encounters:  01/17/18 137 lb 5.6 oz (62.3 kg)   01/11/18 122 lb 9.2 oz (55.6 kg)    Ideal Body Weight:  56.8 kg  BMI:  Body mass index is 22.86 kg/m.  Estimated Nutritional Needs:   Kcal:  1400-1600  Protein:  75-85 gm  Fluid:  1.4-1.6 L    Molli Barrows, RD, LDN, [place] Pager [phone] After Hours Pager [phone]

## 2018-01-17 NOTE — Progress Notes (Addendum)
Progress Note  Patient Name: Jeanne Hart Date of Encounter: 01/17/2018  Primary Cardiologist: Glenetta Hew, MD   Subjective   Yesterday PM - given IV Mg 2g for intermittent Junctional rhtyhm in 30-40s. Overnight - CP/SOB - HR down to 20s. Felt dizzy & nauseated (called On-call Fellow) -- Atropine & IVF given --> now HR in mid 30s with stable BP.  Feeling better.  Currently feeling better. Only mild chest pain, but no more shortness of breath.  Inpatient Medications    Scheduled Meds: . amLODipine  5 mg Oral Daily  . apixaban  10 mg Oral BID   Followed by  . [START ON 01/22/2018] apixaban  5 mg Oral BID  . aspirin EC  81 mg Oral Daily  . darifenacin  7.5 mg Oral Daily  . feeding supplement (ENSURE ENLIVE)  237 mL Oral TID BM  . feeding supplement (PRO-STAT SUGAR FREE 64)  30 mL Oral BID  . isosorbide mononitrate  30 mg Oral Daily  . levothyroxine  100 mcg Oral QAC breakfast  . pantoprazole  40 mg Oral Daily  . polyvinyl alcohol  2 drop Both Eyes QHS  . predniSONE  40 mg Oral Q breakfast  . sodium chloride flush  3 mL Intravenous Q12H   Continuous Infusions: . sodium chloride 75 mL/hr at 01/17/18 0437   PRN Meds: acetaminophen **OR** acetaminophen, atropine, hydrocortisone, ondansetron **OR** ondansetron (ZOFRAN) IV, senna-docusate, zolpidem   Vital Signs    Vitals:   01/17/18 0631 01/17/18 0646 01/17/18 0702 01/17/18 0731  BP: (!) 103/43 (!) 129/53 (!) 109/47 (!) 127/53  Pulse: (!) 30 (!) 39 (!) 35 (!) 36  Resp: 19 19 19 19   Temp:  (!) 97.5 F (36.4 C)    TempSrc:  Oral    SpO2: 94% 93% 95% 95%  Weight:  137 lb 5.6 oz (62.3 kg)    Height:        Intake/Output Summary (Last 24 hours) at 01/17/2018 0855 Last data filed at 01/17/2018 0700 Gross per 24 hour  Intake 1420.42 ml  Output 200 ml  Net 1220.42 ml   Filed Weights   01/15/18 0426 01/16/18 0700 01/17/18 0646  Weight: 127 lb 6.8 oz (57.8 kg) 125 lb 14.4 oz (57.1 kg) 137 lb 5.6 oz (62.3 kg)     Telemetry    Junctional rhythm rate 36; some episodes of conducted P waves ? Glade Stanford with 1st-2nd Deg AVB - Personally Reviewed  ECG    Junctional (escape rhythm)) Loletha Grayer - (initial rate 32) 37 - Personally Reviewed  Physical Exam   Physical Exam  Constitutional: She is oriented to person, place, and time. She appears well-developed and well-nourished. No distress.  Elderly woman.  Chronically ill-appearing (thin and frail)  HENT:  Head: Normocephalic and atraumatic.  Neck: No JVD present.  Cardiovascular: Regular rhythm, normal heart sounds and normal pulses. Bradycardia present. PMI is not displaced. Exam reveals no gallop and no friction rub.  No murmur heard. Pulmonary/Chest: Effort normal.  Decreased basal breath sounds.  Diffuse interstitial sounds.  Nonlabored  Abdominal: Soft. Bowel sounds are normal.  Musculoskeletal: She exhibits no edema.  Neurological: She is alert and oriented to person, place, and time.  Psychiatric: She has a normal mood and affect. Her behavior is normal. Judgment and thought content normal.  Nursing note and vitals reviewed.    Labs    Chemistry Recent Labs  Lab 01/15/18 0440 01/16/18 0336 01/17/18 0426  NA 122* 123* 119*  K  4.3 4.6 5.4*  CL 91* 90* 90*  CO2 22 22 20*  GLUCOSE 90 101* 138*  BUN 41* 41* 55*  CREATININE 1.18* 1.03* 1.21*  CALCIUM 8.3* 8.4* 8.6*  PROT 5.1* 5.3* 5.0*  ALBUMIN 2.0* 2.1* 2.0*  AST 35 37 38  ALT 34 36 36  ALKPHOS 111 119 116  BILITOT 0.9 0.8 0.7  GFRNONAA 40* 47* 39*  GFRAA 46* 55* 45*  ANIONGAP 9 11 9      Hematology Recent Labs  Lab 01/16/18 0336 01/16/18 1520 01/17/18 0426  WBC 8.1 10.4 8.0  RBC 3.67* 3.52* 3.38*  HGB 10.9* 10.6* 10.1*  HCT 30.6* 29.6* 28.3*  MCV 83.4 84.1 83.7  MCH 29.7 30.1 29.9  MCHC 35.6 35.8 35.7  RDW 16.1* 16.4* 16.3*  PLT 305 294 279    Cardiac EnzymesNo results for input(s): TROPONINI in the last 168 hours. No results for input(s): TROPIPOC in the last  168 hours.   BNP Recent Labs  Lab 01/14/18 0335 01/15/18 0440  BNP 240.8* 112.9*     DDimer No results for input(s): DDIMER in the last 168 hours.   Radiology    No results found.  Cardiac Studies   4/9 - R Radial & basilic vein thrombosis  Patient Profile     84 y.o. female with long-standing PAF as well as CAD-PCI x2.  She has not been on warfarin due to bleeding issues and falls.  Last cardiac monitor and stress test were April 2017. She has been maintained on amiodarone (200 mg daily reduced to 100 mg) with intermittent dosing of metoprolol as needed --since she was last seen by Dr. Ellyn Hack, atenolol 25 mg was added to her regimen following hospitalization in February.  She was admitted on April 4 being brought in from the nursing facility with generalized weakness and nausea.  She is found to be bradycardic with heart rate in 30s, junctional rhythm with a left bundle branch block.  She was treated with IV calcium and albuterol etc. and her heart rate improved some.  However she has maintained a bradycardic rhythm and has now mostly been in junctional bradycardia.  Atenolol was discontinued along with amiodarone.  He is now concern for possible amiodarone toxicity with pneumonitis seen on CT scan.  As of 410, apparently her heart rate had improved to the 60s with no pauses after atenolol was held.  There is no indication for pace however yesterday (410) afternoon again went back into a junctional bradycardia rhythm.r   Assessment & Plan    Principal Problem:   Symptomatic bradycardia Active Problems:   CAD S/P percutaneous coronary angioplasty   Amiodarone pulmonary toxicity   Paroxysmal atrial fibrillation [facility]): Not on Anticoagulation 2/2 GIB history.  CHA2DS2VASC =4   Essential hypertension   Hypothyroidism   PAF (paroxysmal atrial fibrillation) (HCC)   CKD (chronic kidney disease) stage 3, GFR 30-59 ml/min (HCC)   AKI (acute kidney injury) (HCC)   Hyperkalemia    Hyponatremia   Protein-calorie malnutrition, severe   Cough   Acute on chronic combined systolic and diastolic CHF (congestive heart failure) (HCC)   Squamous carcinoma   FTT (failure to thrive) in adult, 30 pound wt loss over 2 months, unable to walk for one month.   Opacity of lung on imaging study, bilateral infection vs amio toxcity   DVT (deep venous thrombosis) (HCC)   Symptomatic bradycardia: Junctional rhythm with rates in the 30s.  I do not think that this is related to her  atenolol which should be out of her system having been stopped back on April 4.  I am concerned that she is still under the effects of the long-standing amiodarone.  At this point I have asked electrophysiology to see her because I think probably she will be a candidate for pacemaker placement, especially in light of the fact that she went into the 20s last night requiring atropine.  (Given the long half-life of amiodarone, I do not see this being an issue that will resolve anytime soon).   She had chest pain with a heart rate in the 20s, that is not unexpected given known CAD.  Would not pursue ischemic evaluation.  She has hyponatremia and hyperkalemia being addressed by the hospitalist service.   Concern for pneumonitis from amiodarone toxicity.  Currently on steroids.  (Will be beneficial to have checked ESR and CRP)    For questions or updates, please contact [place] Please consult [email or web address] for contact info under Cardiology/STEMI.      Signed, Glenetta Hew, MD  01/17/2018, 8:55 AM

## 2018-01-17 NOTE — Progress Notes (Addendum)
Progress Note  Patient Name: Jeanne Hart Date of Encounter: 01/17/2018  Primary Cardiologist: Glenetta Hew, MD   Subjective   Feels weak, no energy (though not unlike last couple days), no SOB, + CP with palpation   Inpatient Medications    Scheduled Meds: . amLODipine  5 mg Oral Daily  . apixaban  10 mg Oral BID   Followed by  . [START ON 01/22/2018] apixaban  5 mg Oral BID  . aspirin EC  81 mg Oral Daily  . darifenacin  7.5 mg Oral Daily  . feeding supplement (ENSURE ENLIVE)  237 mL Oral TID BM  . feeding supplement (PRO-STAT SUGAR FREE 64)  30 mL Oral BID  . isosorbide mononitrate  30 mg Oral Daily  . levothyroxine  100 mcg Oral QAC breakfast  . pantoprazole  40 mg Oral Daily  . polyvinyl alcohol  2 drop Both Eyes QHS  . predniSONE  40 mg Oral Q breakfast  . sodium chloride flush  3 mL Intravenous Q12H   Continuous Infusions: . sodium chloride 75 mL/hr at 01/17/18 0437   PRN Meds: acetaminophen **OR** acetaminophen, atropine, hydrocortisone, ondansetron **OR** ondansetron (ZOFRAN) IV, senna-docusate, zolpidem   Vital Signs    Vitals:   01/17/18 0646 01/17/18 0702 01/17/18 0731 01/17/18 0858  BP: (!) 129/53 (!) 109/47 (!) 127/53 (!) 119/47  Pulse: (!) 39 (!) 35 (!) 36 (!) 36  Resp: 19 19 19    Temp: (!) 97.5 F (36.4 C)     TempSrc: Oral     SpO2: 93% 95% 95% 93%  Weight: 137 lb 5.6 oz (62.3 kg)     Height:        Intake/Output Summary (Last 24 hours) at 01/17/2018 1213 Last data filed at 01/17/2018 0700 Gross per 24 hour  Intake 1180.42 ml  Output 200 ml  Net 980.42 ml   Filed Weights   01/15/18 0426 01/16/18 0700 01/17/18 0646  Weight: 127 lb 6.8 oz (57.8 kg) 125 lb 14.4 oz (57.1 kg) 137 lb 5.6 oz (62.3 kg)    Telemetry    In review of her telemetry, initially junctional rhythm 30's >> regained SR/SB w/1;1 conduction for some days, yesterday again developed junctional rhythm, 30's, this AM remains in junctional, 30's  - Personally Reviewed  ECG       junctional rhythm, 32bpm, QRS 147ms, QT 667ms (corrects to 452 given rate) - Personally Reviewed  Physical Exam   GEN: No acute distress, ill appearing Neck: No JVD Cardiac: RRR, bradycardic, no murmurs, rubs, or gallops.  Respiratory: CTA b/l. GI: Soft, nontender, non-distended  MS: No LE edema; No deformity. RUE swollen Neuro:  Nonfocal  Psych: Normal affect   Labs    Chemistry Recent Labs  Lab 01/15/18 0440 01/16/18 0336 01/17/18 0426  NA 122* 123* 119*  K 4.3 4.6 5.4*  CL 91* 90* 90*  CO2 22 22 20*  GLUCOSE 90 101* 138*  BUN 41* 41* 55*  CREATININE 1.18* 1.03* 1.21*  CALCIUM 8.3* 8.4* 8.6*  PROT 5.1* 5.3* 5.0*  ALBUMIN 2.0* 2.1* 2.0*  AST 35 37 38  ALT 34 36 36  ALKPHOS 111 119 116  BILITOT 0.9 0.8 0.7  GFRNONAA 40* 47* 39*  GFRAA 46* 55* 45*  ANIONGAP 9 11 9      Hematology Recent Labs  Lab 01/16/18 0336 01/16/18 1520 01/17/18 0426  WBC 8.1 10.4 8.0  RBC 3.67* 3.52* 3.38*  HGB 10.9* 10.6* 10.1*  HCT 30.6* 29.6* 28.3*  MCV  83.4 84.1 83.7  MCH 29.7 30.1 29.9  MCHC 35.6 35.8 35.7  RDW 16.1* 16.4* 16.3*  PLT 305 294 279    Cardiac EnzymesNo results for input(s): TROPONINI in the last 168 hours. No results for input(s): TROPIPOC in the last 168 hours.   BNP Recent Labs  Lab 01/14/18 0335 01/15/18 0440  BNP 240.8* 112.9*     DDimer No results for input(s): DDIMER in the last 168 hours.   Radiology    No results found.  Cardiac Studies   Echo 12/09/17- TTE Study Conclusions - Left ventricle: The cavity size was normal. There was focal basal hypertrophy. Systolic function was vigorous. The estimated ejection fraction was in the range of 65% to 70%. Diastolic function is abnormal, indeterminant grade. Wall motion was normal; there were no regional wall motion abnormalities. - Aortic valve: Mildly calcified annulus. Trileaflet; mildly thickened leaflets. Valve area (VTI): 1.93 cm^2. Valve area (Vmax): 1.7 cm^2. Valve area  (Vmean): 1.81 cm^2. - Mitral valve: Mildly calcified annulus. Mildly thickened leaflets . There was mild regurgitation. - Left atrium: The atrium was severely dilated. - Right atrium: The atrium was mildly dilated. - Pulmonary arteries: Systolic pressure was moderately increased. PA peak pressure: 56 mm Hg (S). - Technically adequate study.    Patient Profile     84 y.o. female CAD (remote PCI in 2012), HTN, HLD, IBS, CRI, and PAFib, admitted with symptomatic bradycardia and electrolyte imbalance, AKI on CKD.  EP is asked to revisit case with recurrent bradycardia.  Assessment & Plan    1. PAFib     CHA2DS2Vasc is 5     In review of record and d/w Dr. Ellyn Hack, not anticoagulated historically 2/2 hx of GIB, fragility and hx of falls  2. Bradycardia     She presented in junctional bradycardia      amiodarone (has been on amio at least 5 years, longer in d//w Dr. Ellyn Hack), was held on admission though Lucienne Sawyers take months to wash out     Metoprolol held on admission     On Norvasc, not likely to be contributing to bradycardia   Dr. Curt Bears has seen and examined the patient Amiodarone on board, Aimie Wagman be for months, though she had regained conduction for days Unclear why bradycardic again, likely sinus node dysfunction, though given high dose of Eliquis she is at significant risk of bleeding complication. BP is holding, patient feels week, though has a number of other issues that can be contributing to this along with her bradycardia  She received atropine last night with reports of HR 24-30bpm, BP 90/68 with c/o CP  For now, plan is to hold off on PPM implant until acute Eliquis 10mg  BID dosing is completed. Place zoll pads on patient   3. AKI, Electrolyte imbalance K+ on admission 6/0 corrected >> back up some to 5.4 this AM Na+ on admission 120 >> remained 120's this AM down to 119 Mag this AM 1.5 >> 2.4 after replacement this AM Creat down to 2/05  4. Pneumonitis      Felt  2/2 amiodarone, was stopped  5. RUE DVT (radial, basilic and cephalic veins)     On Eliquis, continue as per pharmacy and IM   6. CP     Patient with ascultation mentions that this elicits her CP     She has chest wall tenderness with palpation and says this is where/what hurts her the most     No anginal sounding symptoms   Management  of her multiple medical issues is deferred to her primary team, EP Sharlett Lienemann follow along for HR/rhyhm.   For questions or updates, please contact Angie Please consult [email or web address] for contact info under Cardiology/STEMI.      Signed, Baldwin Jamaica, PA-C  01/17/2018, 12:13 PM    I have seen and examined this patient with Tommye Standard.  Agree with above, note added to reflect my findings.  On exam, bradycardic, regular, no murmurs, lungs clear.  Patient has had continued junctional rhythm.  Her heart rate improved over the weekend, but yesterday went back into junctional rhythm.  She is feeling weak and fatigued.  It is likely that she would be benefited by pacemaker implant.  Unfortunately, she does have a right upper extremity DVT and is on Eliquis 10 mg twice a day.  This poses a very high bleeding risk.  Would like to hold off on pacemaker implant until after she is finished the high-dose of Eliquis.  Should she continue to have issues with bradycardia, she would likely benefit from dopamine infusion or temporary wire implant.  Also her sodium is quite low today, with repletion potentially helping her rhythm. With that and her DVT, she may benefit from a malignancy workup.  Jamison Yuhasz M. Justyna Timoney MD 01/17/2018 1:13 PM

## 2018-01-17 NOTE — Progress Notes (Signed)
Zoll Pads placed on pt per MD order.

## 2018-01-17 NOTE — Progress Notes (Signed)
HR sustaining in the 30s and as low as 27.  BP down to 111/52.  Rapid response nurse, [place], and Forrest Moron, NP notified of BP and HR slowly decreasing.  Both Baltazar Najjar, NP and Mindy, RN state they will come to evaluate pt. 0326: EKG shows junctional bradycardia, HR 32.  BP 112/42.  Pt endorses midsternal CP 7/10 "off and on all day."  Denies any other symptoms.  Baltazar Najjar, NP and Leslye Peer, RN at bedside. 9012: HR 24-30. BP 90/68.  0.4 mg IV atropine given per NP order.  Rapid response nurse at bedside. 0341: HR up to 35-40 and BP 116/47 following atropine.  Pt states she feels "a little better."  NP paging cardiology fellow.  Will continue to monitor.  [name]

## 2018-01-18 DIAGNOSIS — I82622 Acute embolism and thrombosis of deep veins of left upper extremity: Secondary | ICD-10-CM

## 2018-01-18 LAB — CBC
HEMATOCRIT: 30.4 % — AB (ref 36.0–46.0)
Hemoglobin: 10.7 g/dL — ABNORMAL LOW (ref 12.0–15.0)
MCH: 29.6 pg (ref 26.0–34.0)
MCHC: 35.2 g/dL (ref 30.0–36.0)
MCV: 84 fL (ref 78.0–100.0)
Platelets: 364 10*3/uL (ref 150–400)
RBC: 3.62 MIL/uL — ABNORMAL LOW (ref 3.87–5.11)
RDW: 16.3 % — AB (ref 11.5–15.5)
WBC: 23.5 10*3/uL — AB (ref 4.0–10.5)

## 2018-01-18 LAB — MAGNESIUM: MAGNESIUM: 2.6 mg/dL — AB (ref 1.7–2.4)

## 2018-01-18 LAB — DIFFERENTIAL
Basophils Absolute: 0 10*3/uL (ref 0.0–0.1)
Basophils Relative: 0 %
Eosinophils Absolute: 0 10*3/uL (ref 0.0–0.7)
Eosinophils Relative: 0 %
LYMPHS PCT: 2 %
Lymphs Abs: 0.5 10*3/uL — ABNORMAL LOW (ref 0.7–4.0)
MONOS PCT: 4 %
Monocytes Absolute: 1 10*3/uL (ref 0.1–1.0)
NEUTROS ABS: 23.3 10*3/uL — AB (ref 1.7–7.7)
NEUTROS PCT: 94 %

## 2018-01-18 LAB — COMPREHENSIVE METABOLIC PANEL
ALBUMIN: 2.1 g/dL — AB (ref 3.5–5.0)
ALT: 43 U/L (ref 14–54)
ANION GAP: 13 (ref 5–15)
AST: 46 U/L — AB (ref 15–41)
Alkaline Phosphatase: 191 U/L — ABNORMAL HIGH (ref 38–126)
BUN: 74 mg/dL — AB (ref 6–20)
CHLORIDE: 88 mmol/L — AB (ref 101–111)
CO2: 16 mmol/L — ABNORMAL LOW (ref 22–32)
Calcium: 8.8 mg/dL — ABNORMAL LOW (ref 8.9–10.3)
Creatinine, Ser: 1.68 mg/dL — ABNORMAL HIGH (ref 0.44–1.00)
GFR calc Af Amer: 30 mL/min — ABNORMAL LOW (ref >60)
GFR, EST NON AFRICAN AMERICAN: 26 mL/min — AB (ref >60)
GLUCOSE: 167 mg/dL — AB (ref 65–99)
Potassium: 5.4 mmol/L — ABNORMAL HIGH (ref 3.5–5.1)
Sodium: 117 mmol/L — CL (ref 135–145)
Total Bilirubin: 0.7 mg/dL (ref 0.3–1.2)
Total Protein: 5.4 g/dL — ABNORMAL LOW (ref 6.5–8.1)

## 2018-01-18 LAB — BASIC METABOLIC PANEL
ANION GAP: 10 (ref 5–15)
BUN: 86 mg/dL — ABNORMAL HIGH (ref 6–20)
CO2: 18 mmol/L — ABNORMAL LOW (ref 22–32)
Calcium: 8.4 mg/dL — ABNORMAL LOW (ref 8.9–10.3)
Chloride: 85 mmol/L — ABNORMAL LOW (ref 101–111)
Creatinine, Ser: 1.76 mg/dL — ABNORMAL HIGH (ref 0.44–1.00)
GFR calc Af Amer: 29 mL/min — ABNORMAL LOW (ref >60)
GFR calc non Af Amer: 25 mL/min — ABNORMAL LOW (ref >60)
GLUCOSE: 167 mg/dL — AB (ref 65–99)
POTASSIUM: 6 mmol/L — AB (ref 3.5–5.1)
Sodium: 113 mmol/L — CL (ref 135–145)

## 2018-01-18 LAB — PHOSPHORUS: PHOSPHORUS: 4.6 mg/dL (ref 2.5–4.6)

## 2018-01-18 LAB — ANTINUCLEAR ANTIBODIES, IFA: ANTINUCLEAR ANTIBODIES, IFA: NEGATIVE

## 2018-01-18 MED ORDER — SODIUM BICARBONATE 650 MG PO TABS
650.0000 mg | ORAL_TABLET | Freq: Three times a day (TID) | ORAL | Status: DC
  Administered 2018-01-18 – 2018-01-19 (×4): 650 mg via ORAL
  Filled 2018-01-18 (×4): qty 1

## 2018-01-18 MED ORDER — SODIUM CHLORIDE 0.9 % IV BOLUS
250.0000 mL | Freq: Once | INTRAVENOUS | Status: AC
Start: 2018-01-18 — End: 2018-01-18
  Administered 2018-01-18: 250 mL via INTRAVENOUS

## 2018-01-18 MED ORDER — SODIUM CHLORIDE 0.9 % IV SOLN
INTRAVENOUS | Status: DC
  Administered 2018-01-18: 17:00:00 via INTRAVENOUS
  Administered 2018-01-19 (×2): 75 mL/h via INTRAVENOUS

## 2018-01-18 NOTE — Progress Notes (Addendum)
Progress Note  Patient Name: Jeanne Hart Date of Encounter: 01/18/2018  Primary Cardiologist: Dr. Glenetta Hew   Subjective   Pt stating that she is feeling better from yesterday. She has mild chest discomfort. HR remains in the 30's (junctional).   Inpatient Medications    Scheduled Meds: . apixaban  10 mg Oral BID   Followed by  . [START ON 01/22/2018] apixaban  5 mg Oral BID  . aspirin EC  81 mg Oral Daily  . darifenacin  7.5 mg Oral Daily  . feeding supplement (ENSURE ENLIVE)  237 mL Oral TID BM  . feeding supplement (PRO-STAT SUGAR FREE 64)  30 mL Oral BID  . isosorbide mononitrate  30 mg Oral Daily  . levothyroxine  100 mcg Oral QAC breakfast  . pantoprazole  40 mg Oral Daily  . polyvinyl alcohol  2 drop Both Eyes QHS  . predniSONE  40 mg Oral Q breakfast  . sodium chloride flush  3 mL Intravenous Q12H   Continuous Infusions:  PRN Meds: acetaminophen **OR** acetaminophen, atropine, hydrocortisone, ondansetron **OR** ondansetron (ZOFRAN) IV, senna-docusate, zolpidem   Vital Signs    Vitals:   01/17/18 2000 01/17/18 2358 01/18/18 0638 01/18/18 0827  BP: (!) 146/48 (!) 126/51 (!) 129/43 (!) 130/54  Pulse: (!) 44 (!) 37 (!) 40 (!) 41  Resp: 18 18 18 16   Temp: 97.6 F (36.4 C) (!) 97.5 F (36.4 C) (!) 97.4 F (36.3 C)   TempSrc: Oral Oral Oral   SpO2: 90% (!) 89% 94% 92%  Weight:   137 lb (62.1 kg)   Height:        Intake/Output Summary (Last 24 hours) at 01/18/2018 0945 Last data filed at 01/18/2018 0640 Gross per 24 hour  Intake 120 ml  Output 250 ml  Net -130 ml   Filed Weights   01/16/18 0700 01/17/18 0646 01/18/18 0638  Weight: 125 lb 14.4 oz (57.1 kg) 137 lb 5.6 oz (62.3 kg) 137 lb (62.1 kg)    Physical Exam   General: Elderly, frail,  NAD Skin: Warm, dry, intact  Head: Normocephalic, atraumatic, clear, moist mucus membranes. Neck: Negative for carotid bruits. No JVD Lungs:Clear to ausculation bilaterally. No wheezes, rales, or  rhonchi. Breathing is unlabored. Cardiovascular: RRR with S1 S2. No murmurs, rubs, or gallops Abdomen: Soft, non-tender, non-distended with normoactive bowel sounds. No obvious abdominal masses. MSK: Strength and tone appear normal for age. 5/5 in all extremities Extremities: No edema. No clubbing or cyanosis. DP/PT pulses 2+ bilaterally Neuro: Alert and oriented. No focal deficits. No facial asymmetry. MAE spontaneously. Psych: Responds to questions appropriately with normal affect.    Labs    Chemistry Recent Labs  Lab 01/16/18 0336 01/17/18 0426 01/18/18 0520  NA 123* 119* 117*  K 4.6 5.4* 5.4*  CL 90* 90* 88*  CO2 22 20* 16*  GLUCOSE 101* 138* 167*  BUN 41* 55* 74*  CREATININE 1.03* 1.21* 1.68*  CALCIUM 8.4* 8.6* 8.8*  PROT 5.3* 5.0* 5.4*  ALBUMIN 2.1* 2.0* 2.1*  AST 37 38 46*  ALT 36 36 43  ALKPHOS 119 116 191*  BILITOT 0.8 0.7 0.7  GFRNONAA 47* 39* 26*  GFRAA 55* 45* 30*  ANIONGAP 11 9 13      Hematology Recent Labs  Lab 01/16/18 1520 01/17/18 0426 01/18/18 0520  WBC 10.4 8.0 23.5*  RBC 3.52* 3.38* 3.62*  HGB 10.6* 10.1* 10.7*  HCT 29.6* 28.3* 30.4*  MCV 84.1 83.7 84.0  MCH 30.1  29.9 29.6  MCHC 35.8 35.7 35.2  RDW 16.4* 16.3* 16.3*  PLT 294 279 364    Cardiac EnzymesNo results for input(s): TROPONINI in the last 168 hours. No results for input(s): TROPIPOC in the last 168 hours.   BNP Recent Labs  Lab 01/14/18 0335 01/15/18 0440  BNP 240.8* 112.9*    DDimer No results for input(s): DDIMER in the last 168 hours.   Radiology    No results found.  Telemetry    01/18/18 Junctional bradycardia HR 33 - Personally Reviewed  ECG    01/17/18 Junctional HR 37- Personally Reviewed  Cardiac Studies   Echo 12/09/17:  Study Conclusions  - Left ventricle: The cavity size was normal. There was focal basal   hypertrophy. Systolic function was vigorous. The estimated   ejection fraction was in the range of 65% to 70%. Diastolic   function is  abnormal, indeterminant grade. Wall motion was   normal; there were no regional wall motion abnormalities. - Aortic valve: Mildly calcified annulus. Trileaflet; mildly   thickened leaflets. Valve area (VTI): 1.93 cm^2. Valve area   (Vmax): 1.7 cm^2. Valve area (Vmean): 1.81 cm^2. - Mitral valve: Mildly calcified annulus. Mildly thickened leaflets   . There was mild regurgitation. - Left atrium: The atrium was severely dilated. - Right atrium: The atrium was mildly dilated. - Pulmonary arteries: Systolic pressure was moderately increased.   PA peak pressure: 56 mm Hg (S). - Technically adequate study.  Lexiscan stress 01/27/16:  The left ventricular ejection fraction is normal (55-65%).  Nuclear stress EF: 63%.  There was no ST segment deviation noted during stress.  The study is normal.  This is a low risk study.   Normal pharmacologic nuclear stress test with no evidence of prior infarct or ischemia.  Patient Profile     84 y.o. female with past medical history of paroxysmal atrial fibrillation, coronary artery disease, hypertension, hyperlipidemia, irritable bowel syndrome for evaluation of bradycardia.  Patient has a history of coronary artery disease with prior PCI.  Last nuclear study April 2017 showed ejection fraction 63% and no ischemia or infarction.  Last echocardiogram March 2019 showed normal LV systolic function, mild mitral regurgitation, severe left atrial enlargement and mild right atrial enlargement.  Moderate pulmonary hypertension.   She was admitted on 01/11/2018 from a nursing facility with generalized weakness and nausea. She was found to be bradycardic with heart rate in 30s, junctional rhythm with a left bundle branch block. She was treated with IV calcium and albuterol etc. and her heart rate improved some. However, she has maintained a bradycardic rhythm and has now mostly been in junctional bradycardia.  Atenolol was discontinued along with amiodarone. There  is now concern for possible amiodarone toxicity with pneumonitis seen on CT scan.  As of 01/16/18, her heart rate had some improvements to the 60s with no pauses after atenolol was held.  There is no indication for pace however yesterday (4/10) afternoon again went back into a junctional bradycardia rhythm.  Assessment & Plan    1. Symptomatic bradycardia (unclear etiology):  -Remains in junctional rhythm with rates in the 30s>reports less symptomatic than yesterday   -Pt has been on Amiodarone for at least 5 years>>>will take months for complete wash out -BP stable at 130/54>129/43>126/51 -EP consulted yesterday for further input. Appears to be intermittently in  low junctional in the 60's with junctional rhythm in the 30's otherwise.  -Per EP note, does not appear that she is currently a pacemaker  candidate secondary to Eliquis 10mg  PO for UE DVT. Would place her more at risk for intra and post surgical bleeding complications --> anticipate that she will need PPM, but will need to wait until she is on maintenance dose Eliquis & Hyponatremia is resolved. -On high dose Eliquis 10mg  until 01/21/18>>then will be on 5mg   -- Will try to avoid moving to CVICU for Chronotropic support --> Zoll pads in place  2. Chest pain: Not really having pain now -Chest pain initially when HR fell to the 20's>>not unexpected given her know CAD -No ischemic evaluationat this time  -Chest wall tenderness with palpation   3. Concern for pneumonitis from Amiodarone toxicity: -Secondary to amiodarone>>stopped on admission --> on steroids.  4. PAF: -Off Atenolol and Amiodarone -No historically anticoagulated secondary to hx of GIB and falls -CHA2DS2Vasc is 5 - has not been on [place] b/c falls - will be on Eliquis x ~3 months for DVT   5. RUE DVT: -Eliquis per pharmacy (DVT protocol)  6. Hyponatremia: -Worse today, Na+117 today  -Unclear etiology -- per TRH  7. Leukocytosis: -WBC, 23.5 today>>>up from 8.0  yesterday (? demarginalization from steroids -- check Diff) -Pt afebrile>>>97.4 this AM    Signed, Kathyrn Drown NP-C [facility] Pager: [phone] 01/18/2018, 9:45 AM     I have seen, examined and evaluated the patient this AM along with Kathyrn Drown, NP.  After reviewing all the available data and chart, we discussed the patients laboratory, study & physical findings as well as symptoms in detail. I agree with her findings, examination as well as impression recommendations as per our discussion.    Attending adjustments noted in italics.   Remains relatively stable - waiting for reduced dose of Eliquis to move forward with PPM  Needs HypoNatremia & WBC bump worked out as well.  Will continue to follow.   Glenetta Hew, M.D., M.S. Interventional Cardiologist   Pager # [phone] Phone # [phone] [address] [place], [place] [address]     For questions or updates, please contact   Please consult [email or web address] for contact info under Cardiology/STEMI.

## 2018-01-18 NOTE — Progress Notes (Signed)
   01/18/18 1100  Clinical Encounter Type  Visited With Patient  Visit Type Initial  Referral From Chaplain  Consult/Referral To Chaplain  Spiritual Encounters  Spiritual Needs Emotional  Stress Factors  Patient Stress Factors Exhausted  Family Stress Factors Exhausted    Pt was laying in her bed watching TV. No family member present but Pt talked about god family dynamics. Pt was very receptive and appreciative of chaplain's visit. Chaplain provided empathic and compassionate presence.  [place] a Medical sales representative, [employer]

## 2018-01-18 NOTE — Progress Notes (Signed)
[place]  YQM:[id number] DOB: 1927-08-23 DOA: 01/07/2018 PCP: Odette Horns, MD    Brief Narrative:  84 y.o.femalewith a hx of paroxysmal atrial fibrillation not on anticoagulation due to history of bleeding and fall risk, HTN, CAD, and hypothyroidism who presented to the ED from her nursing home w/ generalized weakness and bradycardia. She was noted at the nursing home to have heart rate of 30.  In the ED EKG featured a junctional rhythm with rate 31 and LBBB. Chemistry panel revealed a sodium of 120, potassium 6.1, and creatinine of 2.48, up from 1.00 onemonth prior.   Significant Events: 4/4 admit   Subjective: The patient is resting comfortably in bed.  She tells me she does not feel great "in general" but has no specific focal complaints.  She denies chest pain nausea vomiting abdominal pain or shortness of breath.  Assessment & Plan:  Symptomatic Bradycardia off Atenolol - stoppedAmiodarone4/8 - had initially improved, but recurred again night of 4/10>4/11 - Cards reconsulted and consulted EP in turn   Amiodarone Pulmonary toxicity Now off amio - PCCM has seen in consultation > prednisone 40mg  per day x 1 week then 30mg  per day x 1 week and then 20mg  to continue and taper in pulm ofice  Hyponatremia UNa 41  UOsmol 295  UCr 25  UBUN 351 at time of presentation (not on diuretic at that time) studies not fully c/w SIADH - has been worsening since 4/10 - Na appeared to improve w/ volume expansion - will try this again and follow closely, though UNa is not c/w volume depletion    Recent Labs  Lab 01/14/18 0335 01/15/18 0440 01/16/18 0336 01/17/18 0426 01/18/18 0520  NA 124* 122* 123* 119* 117*   Elevated RF (39) Significance unclear - no joint sx to suggest RA, but this could of course be contributing to her lung disease - currently on steroid tx nonetheless  Hypomagnesemia corrected    Hyperkalemia recurrent - follow trend daily - no indication for tx today   AKI on CKDIII crt climbing in setting of severe bradycardia - volume expand and follow  Recent Labs  Lab 01/14/18 0335 01/15/18 0440 01/16/18 0336 01/17/18 0426 01/18/18 0520  CREATININE 1.07* 1.18* 1.03* 1.21* 1.68*    CAD Asymptomatic  HTN Controlled  PAF CHA2DS2Vasc is 5 - off Atenolol and Amiodarone - not previously on anticoagulation due to hx of GI bleed - now on Eliquis w/ DVT as well, but w/ climbing crt may soon need to choose a different agent - f/u GFR in AM   Deconditioning SNF placement > grandson has chosen [facility]   DVT RUE New start on Elquis - see discussion on anticoag above   Severe malnutrition in context of chronic illness  Leukocytosis  Most likely due to steroid dosing - is afebrile - follow clinically   DVT prophylaxis: Eliquis  Code Status: FULL CODE Family Communication: no family present at time of exam  Disposition Plan: SDU  Consultants:  Cards EP PCCM  Antimicrobials:  none   Objective: Blood pressure (!) 130/54, pulse (!) 41, temperature (!) 97.4 F (36.3 C), temperature source Oral, resp. rate 16, height 5\' 5"  (1.651 m), weight 62.1 kg (137 lb), SpO2 92 %.  Intake/Output Summary (Last 24 hours) at 01/18/2018 1426 Last data filed at 01/18/2018 0640 Gross per 24 hour  Intake 120 ml  Output 250 ml  Net -130 ml   [employer]  01/16/18 0700 01/17/18 0646 01/18/18 0638  Weight: 57.1 kg (125 lb 14.4 oz) 62.3 kg (137 lb 5.6 oz) 62.1 kg (137 lb)    Examination: General: No acute respiratory distress - alert and pleasant  Lungs: Clear to auscultation bilaterally - no crackles  Cardiovascular: bradycardiac at 32 at time of my exam  Abdomen: Nontender, nondistended, soft, bowel sounds positive, no rebound Extremities: no signif B LE edema   CBC: Recent Labs  Lab 01/16/18 1520 01/17/18 0426 01/18/18 0520  WBC 10.4 8.0 23.5*   NEUTROABS  --   --  23.3*  HGB 10.6* 10.1* 10.7*  HCT 29.6* 28.3* 30.4*  MCV 84.1 83.7 84.0  PLT 294 279 233   Basic Metabolic Panel: Recent Labs  Lab 01/16/18 0336 01/17/18 0426 01/18/18 0520  NA 123* 119* 117*  K 4.6 5.4* 5.4*  CL 90* 90* 88*  CO2 22 20* 16*  GLUCOSE 101* 138* 167*  BUN 41* 55* 74*  CREATININE 1.03* 1.21* 1.68*  CALCIUM 8.4* 8.6* 8.8*  MG 1.5* 2.4 2.6*  PHOS 2.8 3.5 4.6   GFR: Estimated Creatinine Clearance: 20.8 mL/min (A) (by C-G formula based on SCr of 1.68 mg/dL (H)).  Liver Function Tests: Recent Labs  Lab 01/15/18 0440 01/16/18 0336 01/17/18 0426 01/18/18 0520  AST 35 37 38 46*  ALT 34 36 36 43  ALKPHOS 111 119 116 191*  BILITOT 0.9 0.8 0.7 0.7  PROT 5.1* 5.3* 5.0* 5.4*  ALBUMIN 2.0* 2.1* 2.0* 2.1*    Recent Results (from the past 240 hour(s))  MRSA PCR Screening     Status: None   Collection Time: 01/11/18  5:29 AM  Result Value Ref Range Status   MRSA by PCR NEGATIVE NEGATIVE Final    Comment:        The GeneXpert MRSA Assay (FDA approved for NASAL specimens only), is one component of a comprehensive MRSA colonization surveillance program. It is not intended to diagnose MRSA infection nor to guide or monitor treatment for MRSA infections. Performed at [facility], [place] [address] [place], [place] [address]      Scheduled Meds: . apixaban  10 mg Oral BID   Followed by  . [START ON 01/22/2018] apixaban  5 mg Oral BID  . aspirin EC  81 mg Oral Daily  . darifenacin  7.5 mg Oral Daily  . feeding supplement (ENSURE ENLIVE)  237 mL Oral TID BM  . feeding supplement (PRO-STAT SUGAR FREE 64)  30 mL Oral BID  . isosorbide mononitrate  30 mg Oral Daily  . levothyroxine  100 mcg Oral QAC breakfast  . pantoprazole  40 mg Oral Daily  . polyvinyl alcohol  2 drop Both Eyes QHS  . predniSONE  40 mg Oral Q breakfast  . sodium chloride flush  3 mL Intravenous Q12H     LOS: 8 days   Cherene Altes, MD [facility] Office  [phone] Pager - Text Page per Amion as per below:  On-Call/Text Page:      [email or web address]      password [email or web address]  If 7PM-7AM, please contact night-coverage [email or web address] Password [email or web address] 01/18/2018, 2:26 PM

## 2018-01-18 NOTE — Progress Notes (Signed)
Physical Therapy Treatment Patient Details Name: Jeanne Hart MRN: [id number] DOB: 05-13-1927 Today's Date: 01/18/2018    History of Present Illness 84 y.o. female with medical history significant for paroxysmal atrial fibrillation not on anticoagulation due to history of bleeding and fall risk, hypertension, coronary artery disease, and hypothyroidism, now presenting to the emergency department from her nursing home for evaluation of generalized weakness and bradycardia.    PT Comments    Treatment limited to exercise in the bed due to reoccurring bradycardia.    Follow Up Recommendations  SNF     Equipment Recommendations  None recommended by PT    Recommendations for Other Services       Precautions / Restrictions Precautions Precautions: Fall    Mobility  Bed Mobility                  Transfers                    Ambulation/Gait                 Stairs             Wheelchair Mobility    Modified Rankin (Stroke Patients Only)       Balance                                            Cognition Arousal/Alertness: Awake/alert Behavior During Therapy: WFL for tasks assessed/performed Overall Cognitive Status: Impaired/Different from baseline                                        Exercises General Exercises - Lower Extremity Ankle Circles/Pumps: 20 reps Heel Slides: AROM;Both;10 reps;Supine(graded resistance) Hip ABduction/ADduction: AROM;Both;Supine;15 reps Straight Leg Raises: AAROM;Both;10 reps;Supine Other Exercises Other Exercises: L arm bicep/tricep presses x 10 reps    General Comments General comments (skin integrity, edema, etc.): Due to pt's low HR in the upper 30's, mobility deferred and light exercise in bed completed.      Pertinent Vitals/Pain Pain Assessment: Faces Faces Pain Scale: Hurts little more Pain Location: stomach Pain Descriptors / Indicators:  Aching;Grimacing Pain Intervention(s): Monitored during session    Home Living                      Prior Function            PT Goals (current goals can now be found in the care plan section) Acute Rehab PT Goals Patient Stated Goal: feel better PT Goal Formulation: With patient/family Time For Goal Achievement: 01/27/18 Potential to Achieve Goals: Fair Progress towards PT goals: Not progressing toward goals - comment(bradycardia limiting treatment)    Frequency    Min 2X/week      PT Plan Current plan remains appropriate    Co-evaluation              AM-PAC PT "6 Clicks" Daily Activity  Outcome Measure  Difficulty turning over in bed (including adjusting bedclothes, sheets and blankets)?: Unable Difficulty moving from lying on back to sitting on the side of the bed? : Unable Difficulty sitting down on and standing up from a chair with arms (e.g., wheelchair, bedside commode, etc,.)?: Unable Help needed moving to and from a bed to chair (including a  wheelchair)?: A Lot Help needed walking in hospital room?: A Lot Help needed climbing 3-5 steps with a railing? : Total 6 Click Score: 8    End of Session Equipment Utilized During Treatment: Oxygen Activity Tolerance: Treatment limited secondary to medical complications (Comment) Patient left: in bed;with call bell/phone within reach;with family/visitor present Nurse Communication: Mobility status PT Visit Diagnosis: Muscle weakness (generalized) (M62.81);Other abnormalities of gait and mobility (R26.89)     Time: 5956-3875 PT Time Calculation (min) (ACUTE ONLY): 18 min  Charges:  $Therapeutic Exercise: 8-22 mins                    G Codes:       14-Feb-2018  [place] Bing, PT [phone]  (pager)   [name] [name] 14-Feb-2018, 5:13 PM

## 2018-01-18 NOTE — Progress Notes (Addendum)
Telemetry reviewed with Dr. Curt Bears, low junctional intermittently 60's, junctional 30's otherwise, BP looks OK.  Hyponatremia persists (worse) Only 3 days so far of week of high dose Eliquis for DVT Leukocytosis, ?steroids  PPM implant risks right now are high, particularly bleeding, continue as per yesterday's recommendations, try to hold off on PPM until on reduced dose of [place], if needed, dopamine support or temp wire.  We Maryella Abood defer care to primary cardiology and team for now EP service remains available, Saiya Crist follow from [place].  Tommye Standard, PA-C  Allegra Lai, MD

## 2018-01-18 NOTE — Progress Notes (Signed)
CRITICAL VALUE ALERT  Critical Value:  Sodium 117  Date & Time Notied:  01/18/2018, 0630  Provider Notified: [facility] paged via [place] on 01/18/2018 @ 0631  Orders Received/Actions taken:  RN also noted and included in text page WBC count yesterday was 8 and this morning it is 23.5, no fevers noted throughout night shift.

## 2018-01-19 LAB — COMPREHENSIVE METABOLIC PANEL
ALT: 44 U/L (ref 14–54)
AST: 41 U/L (ref 15–41)
Albumin: 2 g/dL — ABNORMAL LOW (ref 3.5–5.0)
Alkaline Phosphatase: 125 U/L (ref 38–126)
Anion gap: 10 (ref 5–15)
BUN: 91 mg/dL — AB (ref 6–20)
CO2: 18 mmol/L — AB (ref 22–32)
CREATININE: 1.73 mg/dL — AB (ref 0.44–1.00)
Calcium: 8.1 mg/dL — ABNORMAL LOW (ref 8.9–10.3)
Chloride: 86 mmol/L — ABNORMAL LOW (ref 101–111)
GFR calc Af Amer: 29 mL/min — ABNORMAL LOW (ref >60)
GFR calc non Af Amer: 25 mL/min — ABNORMAL LOW (ref >60)
Glucose, Bld: 124 mg/dL — ABNORMAL HIGH (ref 65–99)
Potassium: 5.9 mmol/L — ABNORMAL HIGH (ref 3.5–5.1)
SODIUM: 114 mmol/L — AB (ref 135–145)
Total Bilirubin: 0.6 mg/dL (ref 0.3–1.2)
Total Protein: 4.8 g/dL — ABNORMAL LOW (ref 6.5–8.1)

## 2018-01-19 LAB — CBC
HCT: 25.1 % — ABNORMAL LOW (ref 36.0–46.0)
HCT: 25 % — ABNORMAL LOW (ref 36.0–46.0)
Hemoglobin: 9 g/dL — ABNORMAL LOW (ref 12.0–15.0)
Hemoglobin: 8.9 g/dL — ABNORMAL LOW (ref 12.0–15.0)
MCH: 29.1 pg (ref 26.0–34.0)
MCH: 28.9 pg (ref 26.0–34.0)
MCHC: 35.9 g/dL (ref 30.0–36.0)
MCHC: 35.6 g/dL (ref 30.0–36.0)
MCV: 81.2 fL (ref 78.0–100.0)
MCV: 81.2 fL (ref 78.0–100.0)
PLATELETS: 314 10*3/uL (ref 150–400)
Platelets: 331 10*3/uL (ref 150–400)
RBC: 3.09 MIL/uL — ABNORMAL LOW (ref 3.87–5.11)
RBC: 3.08 MIL/uL — ABNORMAL LOW (ref 3.87–5.11)
RDW: 15.8 % — AB (ref 11.5–15.5)
RDW: 15.7 % — AB (ref 11.5–15.5)
WBC: 12.7 10*3/uL — ABNORMAL HIGH (ref 4.0–10.5)
WBC: 14.2 10*3/uL — ABNORMAL HIGH (ref 4.0–10.5)

## 2018-01-19 LAB — BASIC METABOLIC PANEL
Anion gap: 9 (ref 5–15)
BUN: 89 mg/dL — ABNORMAL HIGH (ref 6–20)
CALCIUM: 7.9 mg/dL — AB (ref 8.9–10.3)
CO2: 18 mmol/L — AB (ref 22–32)
CREATININE: 1.78 mg/dL — AB (ref 0.44–1.00)
Chloride: 86 mmol/L — ABNORMAL LOW (ref 101–111)
GFR calc non Af Amer: 24 mL/min — ABNORMAL LOW (ref >60)
GFR, EST AFRICAN AMERICAN: 28 mL/min — AB (ref >60)
Glucose, Bld: 117 mg/dL — ABNORMAL HIGH (ref 65–99)
Potassium: 5.6 mmol/L — ABNORMAL HIGH (ref 3.5–5.1)
SODIUM: 113 mmol/L — AB (ref 135–145)

## 2018-01-19 MED ORDER — LORAZEPAM 0.5 MG PO TABS
0.5000 mg | ORAL_TABLET | ORAL | Status: DC | PRN
  Administered 2018-01-19 – 2018-01-20 (×3): 0.5 mg via ORAL
  Filled 2018-01-19 (×3): qty 1

## 2018-01-19 MED ORDER — ATROPINE SULFATE 1 MG/10ML IJ SOSY
0.5000 mg | PREFILLED_SYRINGE | INTRAMUSCULAR | Status: DC | PRN
  Administered 2018-01-19 (×2): 0.5 mg via INTRAVENOUS
  Filled 2018-01-19 (×2): qty 10

## 2018-01-19 MED ORDER — PANTOPRAZOLE SODIUM 40 MG PO TBEC
40.0000 mg | DELAYED_RELEASE_TABLET | Freq: Two times a day (BID) | ORAL | Status: DC
  Administered 2018-01-19: 40 mg via ORAL
  Filled 2018-01-19: qty 1

## 2018-01-19 MED ORDER — NITROGLYCERIN 0.4 MG SL SUBL
0.4000 mg | SUBLINGUAL_TABLET | SUBLINGUAL | Status: DC | PRN
  Administered 2018-01-19 (×3): 0.4 mg via SUBLINGUAL
  Filled 2018-01-19 (×2): qty 1

## 2018-01-19 NOTE — Progress Notes (Signed)
CRITICAL VALUE ALERT  Critical Value:  113  Date & Time Notied: 01/18/2018 @ 2100 Provider Notified: Bodenheimer paged via [facility] Orders Received/Actions taken: Awaiting response

## 2018-01-19 NOTE — Progress Notes (Signed)
[place]  GEX:[id number] DOB: Dec 02, 1926 DOA: 01/25/2018 PCP: Odette Horns, MD    Brief Narrative:  84 y.o.femalewith a hx of paroxysmal atrial fibrillation not on anticoagulation due to history of bleeding and fall risk, HTN, CAD, and hypothyroidism who presented to the ED from her nursing home w/ generalized weakness and bradycardia. She was noted at the nursing home to have heart rate of 30.  In the ED EKG featured a junctional rhythm with rate 31 and LBBB. Chemistry panel revealed a sodium of 120, potassium 6.1, and creatinine of 2.48, up from 1.00 onemonth prior.    Subjective: Pt had an episode of coffee ground emesis this morning, along w/ a drop in her Hgb.  As a result, her anticoag has been stopped for now.  Her Na appears to be slowly improving w/ gentle volume expansion.  Will continue same and follow.   She tells me she continues to feel nauseated, but has not vomited again.  She denies sob, SSCP, or abdom pain at this time.     Assessment & Plan:  Symptomatic Bradycardia off Atenolol - stoppedAmiodarone4/8 - had initially improved, but recurred again night of 4/10>4/11 - Cards reconsulted and consulted EP in turn   Amiodarone Pulmonary toxicity Now off amio - PCCM has seen in consultation > prednisone 40mg  per day x 1 week then 30mg  per day x 1 week and then 20mg  to continue and taper in pulm ofice  UGIB Does not appear hemodynamically significant at this time - recheck Hgb now - stopped anticoag - maximize PPI and add carafate - if Hgb continues to trend down will initiate PPI gtt  Hyponatremia UNa 41  UOsmol 295  UCr 25  UBUN 351 at time of presentation (not on diuretic at that time) studies not fully c/w SIADH - has been worsening since 4/10 - Na appeared to improve w/ volume expansion - continue gentle volume expansion, though UNa is not c/w volume depletion - f/u BMET now   Recent Labs  Lab  01/16/18 0336 01/17/18 0426 01/18/18 0520 01/18/18 2049 01/19/18 0428  NA 123* 119* 117* 113* 114*   Elevated RF (39) Significance unclear - no joint sx to suggest RA, but this could of course be contributing to her lung disease - currently on steroid tx nonetheless  Hypomagnesemia corrected   Hyperkalemia recurrent - follow trend daily - no indication for tx today - expect to improve w/ improving renal fxn   AKI on CKDIII crt has been climbing in setting of severe bradycardia - volume expanding - crt appears to be slowly improving   Recent Labs  Lab 01/16/18 0336 01/17/18 0426 01/18/18 0520 01/18/18 2049 01/19/18 0428  CREATININE 1.03* 1.21* 1.68* 1.76* 1.73*    CAD Asymptomatic  HTN Controlled  PAF CHA2DS2Vasc is 5 - off Atenolol and Amiodarone - not previously on anticoagulation due to hx of GI bleed - was on Eliquis acutely w/ DVT as well, but now holding anticoagulation in setting of apparent UGIB  Deconditioning SNF placement > grandson has chosen [facility]   DVT RUE New start on Elquis this admit, but currently not on anticoag - see discussion on anticoag above   Severe malnutrition in context of chronic illness  Leukocytosis  Most likely due to steroid dosing - trending down at this time   DVT prophylaxis: SCDs  Code Status: FULL CODE Family Communication: no family present at time of exam  Disposition Plan: SDU  Consultants:  Cards EP PCCM  Antimicrobials:  none   Objective: Blood pressure (!) 146/46, pulse (!) 41, temperature (!) 97.5 F (36.4 C), temperature source Oral, resp. rate 20, height 5\' 5"  (1.651 m), weight 63.2 kg (139 lb 5.3 oz), SpO2 95 %.  Intake/Output Summary (Last 24 hours) at 01/19/2018 1127 Last data filed at 01/19/2018 0900 Gross per 24 hour  Intake 1950 ml  Output -  Net 1950 ml   Filed Weights   01/17/18 0646 01/18/18 0638 01/19/18 0520  Weight: 62.3 kg (137 lb 5.6 oz) 62.1 kg (137 lb) 63.2 kg (139 lb  5.3 oz)    Examination: General: No acute respiratory distress - alert  Lungs: CTA B - no wheezing or crackles  Cardiovascular: bradycardiac - no rub or M noted  Abdomen: NT/ND, soft, bowel sounds positive, no rebound Extremities: no edema B LE   CBC: Recent Labs  Lab 01/17/18 0426 01/18/18 0520 01/19/18 0428  WBC 8.0 23.5* 12.7*  NEUTROABS  --  23.3*  --   HGB 10.1* 10.7* 9.0*  HCT 28.3* 30.4* 25.1*  MCV 83.7 84.0 81.2  PLT 279 364 557   Basic Metabolic Panel: Recent Labs  Lab 01/16/18 0336 01/17/18 0426 01/18/18 0520 01/18/18 2049 01/19/18 0428  NA 123* 119* 117* 113* 114*  K 4.6 5.4* 5.4* 6.0* 5.9*  CL 90* 90* 88* 85* 86*  CO2 22 20* 16* 18* 18*  GLUCOSE 101* 138* 167* 167* 124*  BUN 41* 55* 74* 86* 91*  CREATININE 1.03* 1.21* 1.68* 1.76* 1.73*  CALCIUM 8.4* 8.6* 8.8* 8.4* 8.1*  MG 1.5* 2.4 2.6*  --   --   PHOS 2.8 3.5 4.6  --   --    GFR: Estimated Creatinine Clearance: 20.2 mL/min (A) (by C-G formula based on SCr of 1.73 mg/dL (H)).  Liver Function Tests: Recent Labs  Lab 01/16/18 0336 01/17/18 0426 01/18/18 0520 01/19/18 0428  AST 37 38 46* 41  ALT 36 36 43 44  ALKPHOS 119 116 191* 125  BILITOT 0.8 0.7 0.7 0.6  PROT 5.3* 5.0* 5.4* 4.8*  ALBUMIN 2.1* 2.0* 2.1* 2.0*    Recent Results (from the past 240 hour(s))  MRSA PCR Screening     Status: None   Collection Time: 01/11/18  5:29 AM  Result Value Ref Range Status   MRSA by PCR NEGATIVE NEGATIVE Final    Comment:        The GeneXpert MRSA Assay (FDA approved for NASAL specimens only), is one component of a comprehensive MRSA colonization surveillance program. It is not intended to diagnose MRSA infection nor to guide or monitor treatment for MRSA infections. Performed at [facility], [place] [address], [place], [place] [address]      Scheduled Meds: . aspirin EC  81 mg Oral Daily  . darifenacin  7.5 mg Oral Daily  . feeding supplement (ENSURE ENLIVE)  237 mL Oral TID BM  .  feeding supplement (PRO-STAT SUGAR FREE 64)  30 mL Oral BID  . isosorbide mononitrate  30 mg Oral Daily  . levothyroxine  100 mcg Oral QAC breakfast  . pantoprazole  40 mg Oral Daily  . polyvinyl alcohol  2 drop Both Eyes QHS  . predniSONE  40 mg Oral Q breakfast  . sodium bicarbonate  650 mg Oral TID  . sodium chloride flush  3 mL Intravenous Q12H     LOS: 9 days   Cherene Altes, MD [facility] Office  [phone] Pager -  Text Page per Shea Evans as per below:  On-Call/Text Page:      [email or web address]      password [email or web address]  If 7PM-7AM, please contact night-coverage [email or web address] Password [facility] 01/19/2018, 11:27 AM

## 2018-01-19 NOTE — Progress Notes (Signed)
Pt  Has vomited dark chocolate looking material.X1. Vitals signs obtained BP 146/46 P 41 t 97.0 (rectal). Attending physician notified.  Ferdinand Lango, RN

## 2018-01-19 NOTE — Progress Notes (Signed)
Pt is complaining of left sided chest pain 8/10,  her VS are as follow : 123/66. P: 34 Sat 98 % on 5 l of 02. Pt is also feeling very weak. MD and rapid response notified.  0.5 mg of Atropine given IV.  Ferdinand Lango, RN

## 2018-01-19 NOTE — Progress Notes (Signed)
Patient's HR down 25 and has been < 30 most of the night. Patient states she feels okay.On call Bodenheimer, NP made aware. PRN Atropine 0.5 mg given. HR up 44 initially, currently 33.  Will continue to monitor.

## 2018-01-19 NOTE — Significant Event (Signed)
Rapid Response Event Note  Overview: Time Called: 1886 Arrival Time: 1410 Event Type: Cardiac  Initial Focused Assessment: Received call that patient is bradycardic with new onset CP. Patient is awake, alert and oriented. Patient c/o mild persistent cp. BP and spO2 stable as documented. HR 38-39, no change from previous assessments. Dr Rayann Heman at bedside, discussed code status.  Interventions: Ativan 0.5 mg po given NTG SL x 1 given Monitored vital signs. Pacing pads remain intact  Plan of Care (if not transferred): Patient and family instructed to call if needed.  Notify team of code status if want to change to DNR Keep pacing pads intact and use prn Give NTG as needed Monitor for changes in condition  Event Summary: Name of Physician Notified: Dr Rayann Heman at 1307  Outcome: Stayed in room and stabalized, Other (Comment)(Code status discussed and family in discussions.)  Event End Time: [place]

## 2018-01-19 NOTE — Progress Notes (Signed)
Progress Note   Subjective   Patient is very ill.  She has had some confusion.  + chest discomfort.  She states "I feel like I am going to leave this world"  Inpatient Medications    Scheduled Meds: . darifenacin  7.5 mg Oral Daily  . feeding supplement (ENSURE ENLIVE)  237 mL Oral TID BM  . feeding supplement (PRO-STAT SUGAR FREE 64)  30 mL Oral BID  . isosorbide mononitrate  30 mg Oral Daily  . levothyroxine  100 mcg Oral QAC breakfast  . pantoprazole  40 mg Oral BID AC  . polyvinyl alcohol  2 drop Both Eyes QHS  . predniSONE  40 mg Oral Q breakfast  . sodium bicarbonate  650 mg Oral TID  . sodium chloride flush  3 mL Intravenous Q12H   Continuous Infusions: . sodium chloride 75 mL/hr (01/19/18 0454)   PRN Meds: acetaminophen **OR** acetaminophen, atropine, hydrocortisone, LORazepam, nitroGLYCERIN, ondansetron **OR** ondansetron (ZOFRAN) IV, senna-docusate, zolpidem   Vital Signs    Vitals:   01/19/18 0900 01/19/18 1058 01/19/18 1251 01/19/18 1327  BP: (!) 146/46  123/66 (!) 127/42  Pulse: (!) 41  (!) 36 (!) 38  Resp: 20  (!) 21 16  Temp: (!) 97 F (36.1 C) (!) 97.5 F (36.4 C) (!) 97.3 F (36.3 C)   TempSrc: Rectal Oral Oral   SpO2: 95%  93% 92%  Weight:      Height:        Intake/Output Summary (Last 24 hours) at 01/19/2018 1348 Last data filed at 01/19/2018 0900 Gross per 24 hour  Intake 1710 ml  Output -  Net 1710 ml   Filed Weights   01/17/18 0646 01/18/18 0638 01/19/18 0520  Weight: 137 lb 5.6 oz (62.3 kg) 137 lb (62.1 kg) 139 lb 5.3 oz (63.2 kg)    Telemetry    Junctional rhythm 30s - Personally Reviewed  Physical Exam   GEN- The patient is critically ill appearing, alert and interactive Head- normocephalic, atraumatic Eyes-  Sclera clear, conjunctiva pink Ears- hearing intact Oropharynx- clear Neck- supple, + JVD Lungs- few basilar rales, normal work of breathing Heart- bradycardic regular rhythm  GI- soft, NT, ND, + BS Extremities-  no clubbing, cyanosis, + edema  MS- diffuse atrophy Skin- + very pale Psych- euthymic mood, full affect Neuro- strength and sensation are intact   Labs    Chemistry Recent Labs  Lab 01/17/18 0426 01/18/18 0520 01/18/18 2049 01/19/18 0428 01/19/18 1141  NA 119* 117* 113* 114* 113*  K 5.4* 5.4* 6.0* 5.9* 5.6*  CL 90* 88* 85* 86* 86*  CO2 20* 16* 18* 18* 18*  GLUCOSE 138* 167* 167* 124* 117*  BUN 55* 74* 86* 91* 89*  CREATININE 1.21* 1.68* 1.76* 1.73* 1.78*  CALCIUM 8.6* 8.8* 8.4* 8.1* 7.9*  PROT 5.0* 5.4*  --  4.8*  --   ALBUMIN 2.0* 2.1*  --  2.0*  --   AST 38 46*  --  41  --   ALT 36 43  --  44  --   ALKPHOS 116 191*  --  125  --   BILITOT 0.7 0.7  --  0.6  --   GFRNONAA 39* 26* 25* 25* 24*  GFRAA 45* 30* 29* 29* 28*  ANIONGAP 9 13 10 10 9      Hematology Recent Labs  Lab 01/18/18 0520 01/19/18 0428 01/19/18 1139  WBC 23.5* 12.7* 14.2*  RBC 3.62* 3.09* 3.08*  HGB 10.7* 9.0* 8.9*  HCT 30.4* 25.1* 25.0*  MCV 84.0 81.2 81.2  MCH 29.6 29.1 28.9  MCHC 35.2 35.9 35.6  RDW 16.3* 15.8* 15.7*  PLT 364 314 331    Cardiac EnzymesNo results for input(s): TROPONINI in the last 168 hours. No results for input(s): TROPIPOC in the last 168 hours.      Patient Profile     84 y.o. female CAD (remote PCI in 2012), HTN, HLD, IBS, CRI, and PAFib, admitted with symptomatic bradycardia and hyponatremia, AKI on CKD.    Assessment & Plan    Ms Laster is well known to me.  I have known her since childhood.  She is a remarkable woman who has made very positive impact on many people.  Unfortunately, she is approaching the end of life.  Given worsening renal failure, anemia, hyponatremia, and profound bradycardia, her prognosis is very poor. I had a long discussion with Ms Jock and her 2 daughters (1 by phone).  My concerns of her prognosis were frankly discussed.   She has ongoing chest pain and is not a candidate for cardiology procedures at this time.  Risks of cath or  pacemaker implantation are prohibitively high.  I have advised palliative measures.   I have discussed with Dr Thereasa Solo who agrees.  The patient and her family are amenable but are not yet ready to make DNR/DNI decision.    I have advised that any family that wishes to see her should come to see her at this time.  I will give ativan for anxiety, relief of discomfort (she is allergic to morphine) and place palliative care consult.   Thompson Grayer MD, [facility] 01/19/2018 1:48 PM

## 2018-01-19 NOTE — Progress Notes (Signed)
Unable to obtain oral or axillary temp. Rectal temp. 94.9. On call Bodenheimer made aware. Order received for warming blanket and placed on patient. Will continue to monitor.

## 2018-01-20 DIAGNOSIS — L8990 Pressure ulcer of unspecified site, unspecified stage: Secondary | ICD-10-CM

## 2018-01-20 DIAGNOSIS — Z515 Encounter for palliative care: Secondary | ICD-10-CM

## 2018-01-20 DIAGNOSIS — Z7189 Other specified counseling: Secondary | ICD-10-CM

## 2018-01-20 LAB — CBC
HCT: 24.3 % — ABNORMAL LOW (ref 36.0–46.0)
Hemoglobin: 8.8 g/dL — ABNORMAL LOW (ref 12.0–15.0)
MCH: 29.3 pg (ref 26.0–34.0)
MCHC: 36.2 g/dL — ABNORMAL HIGH (ref 30.0–36.0)
MCV: 81 fL (ref 78.0–100.0)
PLATELETS: 333 10*3/uL (ref 150–400)
RBC: 3 MIL/uL — ABNORMAL LOW (ref 3.87–5.11)
RDW: 15.5 % (ref 11.5–15.5)
WBC: 13.3 10*3/uL — AB (ref 4.0–10.5)

## 2018-01-20 LAB — BASIC METABOLIC PANEL
Anion gap: 11 (ref 5–15)
BUN: 91 mg/dL — AB (ref 6–20)
CALCIUM: 7.8 mg/dL — AB (ref 8.9–10.3)
CO2: 16 mmol/L — ABNORMAL LOW (ref 22–32)
Chloride: 85 mmol/L — ABNORMAL LOW (ref 101–111)
Creatinine, Ser: 1.87 mg/dL — ABNORMAL HIGH (ref 0.44–1.00)
GFR calc Af Amer: 27 mL/min — ABNORMAL LOW (ref >60)
GFR, EST NON AFRICAN AMERICAN: 23 mL/min — AB (ref >60)
Glucose, Bld: 144 mg/dL — ABNORMAL HIGH (ref 65–99)
Potassium: 5.8 mmol/L — ABNORMAL HIGH (ref 3.5–5.1)
SODIUM: 112 mmol/L — AB (ref 135–145)

## 2018-01-20 MED ORDER — GLYCOPYRROLATE 0.2 MG/ML IJ SOLN: 0.2000 mg | INTRAMUSCULAR | Status: DC | PRN

## 2018-01-20 MED ORDER — FENTANYL CITRATE (PF) 100 MCG/2ML IJ SOLN
25.0000 ug | INTRAMUSCULAR | Status: DC | PRN
  Administered 2018-01-20: 25 ug via INTRAVENOUS
  Filled 2018-01-20: qty 2

## 2018-01-20 MED ORDER — MORPHINE SULFATE (PF) 2 MG/ML IV SOLN
1.0000 mg | INTRAVENOUS | Status: DC | PRN
  Filled 2018-01-20: qty 1

## 2018-01-20 MED ORDER — GLYCOPYRROLATE 0.2 MG/ML IJ SOLN
0.2000 mg | INTRAMUSCULAR | Status: DC
  Administered 2018-01-20: 0.2 mg via INTRAVENOUS
  Filled 2018-01-20: qty 1

## 2018-01-20 MED ORDER — MORPHINE SULFATE (PF) 2 MG/ML IV SOLN
1.0000 mg | INTRAVENOUS | Status: DC | PRN
  Administered 2018-01-20: 2 mg via INTRAVENOUS
  Filled 2018-01-20: qty 1

## 2018-01-20 MED ORDER — HALOPERIDOL LACTATE 5 MG/ML IJ SOLN
5.0000 mg | Freq: Once | INTRAMUSCULAR | Status: AC
  Administered 2018-01-20: 5 mg via INTRAVENOUS
  Filled 2018-01-20: qty 1

## 2018-01-20 MED ORDER — MORPHINE SULFATE (PF) 2 MG/ML IV SOLN: 1.0000 mg | INTRAVENOUS | Status: DC

## 2018-01-20 MED ORDER — LORAZEPAM 2 MG/ML IJ SOLN
0.5000 mg | INTRAMUSCULAR | Status: DC | PRN
  Administered 2018-01-20 (×2): 0.5 mg via INTRAVENOUS
  Administered 2018-01-20: 1 mg via INTRAVENOUS
  Filled 2018-01-20 (×3): qty 1

## 2018-01-20 MED ORDER — MORPHINE SULFATE (PF) 2 MG/ML IV SOLN
1.0000 mg | INTRAVENOUS | Status: AC
  Administered 2018-01-20: 1 mg via INTRAVENOUS

## 2018-01-20 MED ORDER — LORAZEPAM 2 MG/ML IJ SOLN
1.0000 mg | Freq: Once | INTRAMUSCULAR | Status: AC
  Administered 2018-01-20: 1 mg via INTRAVENOUS
  Filled 2018-01-20: qty 1

## 2018-01-20 MED ORDER — LORAZEPAM 2 MG/ML IJ SOLN: 0.5000 mg | INTRAMUSCULAR | Status: DC | PRN

## 2018-01-20 MED ORDER — MORPHINE SULFATE (PF) 2 MG/ML IV SOLN
2.0000 mg | INTRAVENOUS | Status: DC
  Administered 2018-01-20 (×2): 2 mg via INTRAVENOUS
  Filled 2018-01-20 (×3): qty 1

## 2018-01-20 MED ORDER — KETOROLAC TROMETHAMINE 15 MG/ML IJ SOLN: 15.0000 mg | Freq: Three times a day (TID) | INTRAMUSCULAR | Status: DC | PRN

## 2018-01-20 MED ORDER — MORPHINE SULFATE (PF) 2 MG/ML IV SOLN: 2.0000 mg | INTRAVENOUS | Status: DC | PRN

## 2018-01-20 MED ORDER — ENSURE ENLIVE PO LIQD
237.0000 mL | Freq: Three times a day (TID) | ORAL | Status: DC | PRN
Start: 2018-01-20 — End: 2018-01-21

## 2018-01-20 MED ORDER — LORAZEPAM 2 MG/ML IJ SOLN: 0.5000 mg | Freq: Three times a day (TID) | INTRAMUSCULAR | Status: DC

## 2018-01-29 ENCOUNTER — Ambulatory Visit: Payer: Medicare Other

## 2018-02-06 NOTE — Progress Notes (Signed)
Jeanne Hart  VVO:[id number] DOB: May 27, 1927 DOA: 01/12/2018 PCP: Odette Horns, MD    Brief Narrative:  84 y.o.femalewith a hx of paroxysmal atrial fibrillation not on anticoagulation due to history of bleeding and fall risk, HTN, CAD, and hypothyroidism who presented to the ED from her nursing home w/ generalized weakness and bradycardia. She was noted at the nursing home to have heart rate of 30.  In the ED EKG featured a junctional rhythm with rate 31 and LBBB. Chemistry panel revealed a sodium of 120, potassium 6.1, and creatinine of 2.48, up from 1.00 onemonth prior.    Subjective: Jeanne Hart has suffered a significant downward turn over the last 48-72hrs.  Dr. Rayann Hart has been instrumental in discussing goals of care w/ her and her family, and the decision has been made to transition to a comfort focused tx approach.  I agree that this is the most appropriate intervention at this time.     Assessment & Plan:  Symptomatic Bradycardia off Atenolol - stoppedAmiodarone4/8 - had initially improved, but recurred again night of 4/10>4/11   Amiodarone Pulmonary toxicity PCCM saw in consultation and pt was being tx w/ a slow prednisone taper   UGIB Was not hemodynamically significant but required stopping of anticoag   Hyponatremia Lab studies did not point to a clear etiology - Na proved refractory to correction   Recent Labs  Lab 01/18/18 0520 01/18/18 2049 01/19/18 0428 01/19/18 1141 2018/02/14 0347  NA 117* 113* 114* 113* 112*   Elevated RF (39) Significance unclear - no joint sx to suggest RA, but this could of course have contributed to her lung disease  Hypomagnesemia corrected   Hyperkalemia recurrent  AKI on CKDIII crt steadily climbed in the setting of severe bradycardia and was refractory to tx   Recent Labs  Lab 01/18/18 0520 01/18/18 2049 01/19/18 0428 01/19/18 1141 Feb 14, 2018 0347    CREATININE 1.68* 1.76* 1.73* 1.78* 1.87*    CAD Asymptomatic  HTN  PAF CHA2DS2Vasc is 5 - not previously on anticoagulation due to hx of GI bleed - was on Eliquis acutely w/ DVT as well, but had to stop in setting of apparent UGIB  Deconditioning   DVT RUE New start on Elquis this admit, but had to be stopped due to UGIB  Severe malnutrition in context of chronic illness    DVT prophylaxis: SCDs  Code Status: DNR - NCB Family Communication:   Disposition Plan: anticipate hospital death   Consultants:  Cards EP PCCM  Antimicrobials:  none   Objective: Blood pressure (!) 128/109, pulse (!) 51, temperature (!) 97.5 F (36.4 C), temperature source Axillary, resp. rate 20, height 5\' 5"  (1.651 m), weight 64.3 kg (141 lb 11.2 oz), SpO2 93 %.  Intake/Output Summary (Last 24 hours) at 02/14/2018 1545 Last data filed at Feb 14, 2018 1300 Gross per 24 hour  Intake 240 ml  Output -  Net 240 ml   Filed Weights   01/18/18 0638 01/19/18 0520 02-14-2018 0346  Weight: 62.1 kg (137 lb) 63.2 kg (139 lb 5.3 oz) 64.3 kg (141 lb 11.2 oz)    Examination: No exam indicated today   CBC: Recent Labs  Lab 01/18/18 0520 01/19/18 0428 01/19/18 1139 2018/02/14 0347  WBC 23.5* 12.7* 14.2* 13.3*  NEUTROABS 23.3*  --   --   --   HGB 10.7* 9.0* 8.9* 8.8*  HCT 30.4* 25.1* 25.0* 24.3*  MCV 84.0 81.2 81.2 81.0  PLT 364 314  331 454   Basic Metabolic Panel: Recent Labs  Lab 01/16/18 0336 01/17/18 0426 01/18/18 0520  01/19/18 0428 01/19/18 1141 2018-01-26 0347  NA 123* 119* 117*   < > 114* 113* 112*  K 4.6 5.4* 5.4*   < > 5.9* 5.6* 5.8*  CL 90* 90* 88*   < > 86* 86* 85*  CO2 22 20* 16*   < > 18* 18* 16*  GLUCOSE 101* 138* 167*   < > 124* 117* 144*  BUN 41* 55* 74*   < > 91* 89* 91*  CREATININE 1.03* 1.21* 1.68*   < > 1.73* 1.78* 1.87*  CALCIUM 8.4* 8.6* 8.8*   < > 8.1* 7.9* 7.8*  MG 1.5* 2.4 2.6*  --   --   --   --   PHOS 2.8 3.5 4.6  --   --   --   --    < > = values in this  interval not displayed.   GFR: Estimated Creatinine Clearance: 18.7 mL/min (A) (by C-G formula based on SCr of 1.87 mg/dL (H)).  Liver Function Tests: Recent Labs  Lab 01/16/18 0336 01/17/18 0426 01/18/18 0520 01/19/18 0428  AST 37 38 46* 41  ALT 36 36 43 44  ALKPHOS 119 116 191* 125  BILITOT 0.8 0.7 0.7 0.6  PROT 5.3* 5.0* 5.4* 4.8*  ALBUMIN 2.1* 2.0* 2.1* 2.0*    Recent Results (from the past 240 hour(s))  MRSA PCR Screening     Status: None   Collection Time: 01/11/18  5:29 AM  Result Value Ref Range Status   MRSA by PCR NEGATIVE NEGATIVE Final    Comment:        The GeneXpert MRSA Assay (FDA approved for NASAL specimens only), is one component of a comprehensive MRSA colonization surveillance program. It is not intended to diagnose MRSA infection nor to guide or monitor treatment for MRSA infections. Performed at [facility], [place] [address], [place], [place] [address]      Scheduled Meds: . LORazepam  0.5 mg Intravenous Q8H  .  morphine injection  2 mg Intravenous Q4H  . polyvinyl alcohol  2 drop Both Eyes QHS  . sodium chloride flush  3 mL Intravenous Q12H     LOS: 10 days   Cherene Altes, MD [facility] Office  [phone] Pager - Text Page per Amion as per below:  On-Call/Text Page:      [email or web address]      password [email or web address]  If 7PM-7AM, please contact night-coverage [email or web address] Password [facility] 01-26-18, 3:45 PM

## 2018-02-06 NOTE — Consult Note (Signed)
Consultation Note Date: 2018-01-26   Patient Name: Jeanne Hart  DOB: 01/17/1927  MRN: [id number]  Age / Sex: 84 y.o., female  PCP: Odette Horns, MD Referring Physician: Cherene Altes, MD  Reason for Consultation: Establishing goals of care, Non pain symptom management, Pain control, Psychosocial/spiritual support and Terminal Care  HPI/Patient Profile: 84 y.o. female  with past medical history of CAD, HTN, bradycardia, a-fib ( not on AC 2/2 falls and h/o bleeding) admitted on 02/05/2018 with weakness and bradycardia. Marland Kitchen She was found to have a HR in the low 30's upon arrival to ED, LBBB, K+ 6.1, creatinine 2.48 ( 1 month ago was 1.0)  Consult oredered for [place] and care at EOL  Clinical Assessment and Goals of Care: Chart reviewed, 1 son, 2 daughters and grandson at the bedside. Pt has been very agitated thru the night. Family now views her as being at EOL and are ready for focus to shift to symptom mgt and comfort. She is exhibiting nearing death awareness  I discussed in detail the role of opioids to help with dyspnea thus agitation but that this would also impact her BP and HR. Family verbalizes understanding and feels as though what they are seeing is "suffering".   Per pt's hx, ms04 listed as an allergy. Per family, this is not a true allergy but intolerance.; "she doesn't like the way it makes her feel". They are comfortable with her receiving this for dyspnea and pain. Notified pharmacy and bedside RN of conversation  Pt's children who are at the bedside are decision makers  Discussed code status and family is ready for full comfort, DNR/DNI    SUMMARY OF RECOMMENDATIONS   DNR/DNI Will start comfort meds Full comfort care Code Status/Advance Care Planning:  DNR    Symptom Management:   Dyspnea: Start scheduled as well as PRN MS04 2mg  q4 ATC and 2-4 q30 min PRN. Monitor for  need for continuous infusion. We did start with 1mg  IV doses with only minimal benefits  Agitation: Likely multifactorial: nearing end of life, hypoxia; start scheduled ativan 0.5 q8 ATC and 0.5-1 q2 PRN. Monitor for need for up tiration  Secretions: Prn robinul ordered.  Fever: Pt has been hypothermic on warming blanket. DC warming blanket as it is causing distress. Will leave low dose PRN toradol order PRN for fever only. Pt has pain with turning  Pt can no longer take PO meds. Will DC  Palliative Prophylaxis:   Aspiration, Bowel Regimen, Delirium Protocol, Eye Care, Frequent Pain Assessment, Oral Care, Palliative Wound Care and Turn Reposition  Additional Recommendations (Limitations, Scope, Preferences):  Full Comfort Care  Psycho-social/Spiritual:   Desire for further Chaplaincy support:yes  Additional Recommendations: Grief/Bereavement Support  Prognosis:   Hours - Days. I did share this prognosis with family  Discharge Planning: Anticipate hospital death. Pt too unstable for transport as she appears to me now      Primary Diagnoses: Present on Admission: . AKI (acute kidney injury) ([place]) . CKD (chronic kidney disease) stage 3,  GFR 30-59 ml/min (HCC) . Essential hypertension . Hypothyroidism . Paroxysmal atrial fibrillation [facility]): Not on Anticoagulation 2/2 GIB history.  CHA2DS2VASC =4 . Symptomatic bradycardia . Hyperkalemia . Hyponatremia   I have reviewed the medical record, interviewed the patient and family, and examined the patient. The following aspects are pertinent.  Past Medical History:  Diagnosis Date  . Anxiety   . Arthritis    "back" (01/11/2018)  . Basal cell carcinoma    "face; back" (01/11/2018)  . CAD S/P percutaneous coronary angioplasty    PCI x 2; once at [facility] and once in [place];; Myoview April 2017: LOW RISK. EF 63%. No ischemia or infarction.  . Diverticulitis   . Dyslipidemia   . GERD (gastroesophageal reflux disease)   .  Hepatitis    "as an infant/child" (01/11/2018)  . Hiatal hernia   . History of kidney stones   . HTN (hypertension)   . Hypothyroidism   . IBS (irritable bowel syndrome)   . Insomnia    chronic  . Migraine    "stopped ~ age 86" (01/11/2018)  . PAF (paroxysmal atrial fibrillation) ([place])    CHA2DS2VASC = 4.  coumadin was stopped secondary to GI Bleed, on amiodarone; monitor march 2014-NSR  . Pneumonia 12/2017  . Squamous carcinoma    "face; back" (01/11/2018)   Social History   Socioeconomic History  . Marital status: Widowed    Spouse name: Not on file  . Number of children: 3  . Years of education: Not on file  . Highest education level: Not on file  Occupational History  . Occupation: Sales/Office-retired  Social Needs  . Financial resource strain: Not on file  . Food insecurity:    Worry: Not on file    Inability: Not on file  . Transportation needs:    Medical: Not on file    Non-medical: Not on file  Tobacco Use  . Smoking status: Never Smoker  . Smokeless tobacco: Never Used  Substance and Sexual Activity  . Alcohol use: No  . Drug use: No  . Sexual activity: Not on file  Lifestyle  . Physical activity:    Days per week: Not on file    Minutes per session: Not on file  . Stress: Not on file  Relationships  . Social connections:    Talks on phone: Not on file    Gets together: Not on file    Attends religious service: Not on file    Active member of club or organization: Not on file    Attends meetings of clubs or organizations: Not on file    Relationship status: Not on file  Other Topics Concern  . Not on file  Social History Narrative  . Not on file   Family History  Problem Relation Age of Onset  . Stroke Mother   . Diabetes Maternal Aunt   . Diabetes Son   . Heart disease Unknown        Both sides of family  . Colon cancer Neg Hx    Scheduled Meds: . LORazepam  0.5 mg Intravenous Q8H  .  morphine injection  2 mg Intravenous Q4H  . polyvinyl  alcohol  2 drop Both Eyes QHS  . sodium chloride flush  3 mL Intravenous Q12H   Continuous Infusions: . sodium chloride 75 mL/hr (01/19/18 2030)   PRN Meds:.acetaminophen **OR** acetaminophen, feeding supplement (ENSURE ENLIVE), glycopyrrolate, hydrocortisone, ketorolac, LORazepam, morphine injection, nitroGLYCERIN, [DISCONTINUED] ondansetron **OR** ondansetron (ZOFRAN) IV Medications Prior to  Admission:  Prior to Admission medications   Medication Sig Start Date End Date Taking? Authorizing Provider  amiodarone (PACERONE) 200 MG tablet TAKE 1 TABLET BY MOUTH ONCE DAILY AND 1 TABLET AS NEEDED FOR FAST HEART RATE Patient taking differently: TAKE 200 mg  TABLET BY MOUTH ONCE DAILY 06/18/17  Yes Leonie Man, MD  amLODipine (NORVASC) 5 MG tablet Take 1 tablet by mouth daily. take 1 tab dialy 05/07/15  Yes [provider]  Ascorbic Acid (VITAMIN C PO) Take 1 tablet by mouth daily.    Yes [provider]  aspirin EC 81 MG tablet Take 1 tablet (81 mg total) by mouth daily. 02/15/15  Yes Leonie Man, MD  atenolol (TENORMIN) 25 MG tablet Take 25 mg by mouth daily.   Yes [provider]  Cholecalciferol (VITAMIN D PO) Take 1 tablet by mouth daily.    Yes [provider]  isosorbide mononitrate (IMDUR) 30 MG 24 hr tablet Take 1 tablet (30 mg total) by mouth daily. 09/09/14  Yes Harris, Abigail, PA-C  levothyroxine (SYNTHROID, LEVOTHROID) 100 MCG tablet Take 100 mcg by mouth daily before breakfast.   Yes [provider]  nitroGLYCERIN (NITROSTAT) 0.4 MG SL tablet Place 1 tablet (0.4 mg total) under the tongue every 5 (five) minutes as needed for chest pain. MAX 3 doses 02/05/17  Yes Leonie Man, MD  pantoprazole (PROTONIX) 40 MG tablet Take 40 mg by mouth daily.    Yes [provider]  Polyethyl Glycol-Propyl Glycol (SYSTANE) 0.4-0.3 % SOLN Apply 2 drops to eye at bedtime.   Yes [provider]  potassium chloride (K-DUR) 10 MEQ tablet  Take 1 tablet (10 mEq total) by mouth daily. 03/05/16  Yes Maryan Puls, MD  potassium chloride (K-DUR,KLOR-CON) 10 MEQ tablet Take 10 mEq by mouth daily. 01/22/2018  Yes [provider]  promethazine (PHENERGAN) 25 MG tablet Take 25 mg by mouth every 6 (six) hours as needed. 01/08/18  Yes [provider]  Solifenacin Succinate (VESICARE PO) Take 1 tablet by mouth daily.   Yes [provider]  zolpidem (AMBIEN CR) 12.5 MG CR tablet Take 12.5 mg by mouth every other day.   Yes [provider]  feeding supplement, ENSURE ENLIVE, (ENSURE ENLIVE) LIQD Take 237 mLs by mouth 2 (two) times daily between meals. 12/11/17   Raiford Noble Latif, DO   Allergies  Allergen Reactions  . Nexium [Esomeprazole] Other (See Comments)    Aggravates IBS.   . Statins Other (See Comments)    Had jaundice as child.  Does tolerate current statin therapy.    Abel Presto [Phenazopyridine] Other (See Comments)    On MAR  . Codeine Other (See Comments)    Post-surgical reaction of unknown type.  . Morphine And Related Other (See Comments)    Unknown.    Marland Kitchen Penicillins Other (See Comments)    Unknown. Has patient had a PCN reaction causing immediate rash, facial/tongue/throat swelling, SOB or lightheadedness with hypotension: YES Has patient had a PCN reaction causing severe rash involving mucus membranes or skin necrosis:NO Has patient had a PCN reaction that required hospitalization NO Has patient had a PCN reaction occurring within the last 10 years: NO If all of the above answers are "NO", then may proceed with Cephalosporin use.     . Sulfa Antibiotics Other (See Comments)    Unknown childhood reaction.     Review of Systems  Unable to perform ROS: Acuity of condition  Physical Exam  Constitutional:  Acutely ill appearing elderly female; appearing to be trending towards EOL Agitated, restless  HENT:  Head: Normocephalic and atraumatic.  Cardiovascular:  bradycardic    Neurological:  Still responding to voice and touch, speaking, but confused nearing death awareness: reaching talking to God  Skin: Skin is warm and dry. There is pallor.  Psychiatric:  Psycho motor restlessness  Nursing note and vitals reviewed.   Vital Signs: BP (!) 128/109 (BP Location: Right Arm)   Pulse (!) 51   Temp (!) 97.5 F (36.4 C) (Axillary)   Resp 20   Ht 5\' 5"  (1.651 m)   Wt 64.3 kg (141 lb 11.2 oz)   SpO2 93%   BMI 23.58 kg/m  Pain Scale: 0-10   Pain Score: 0-No pain   SpO2: SpO2: 93 % O2 Device:SpO2: 93 % O2 Flow Rate: .O2 Flow Rate (L/min): 6 L/min  IO: Intake/output summary:   Intake/Output Summary (Last 24 hours) at Feb 19, 2018 1051 Last data filed at 01/19/2018 1800 Gross per 24 hour  Intake 240 ml  Output -  Net 240 ml    LBM: Last BM Date: 01/19/18 Baseline Weight: Weight: 55.6 kg (122 lb 9.2 oz) Most recent weight: Weight: 64.3 kg (141 lb 11.2 oz)     Palliative Assessment/Data:   Flowsheet Rows     Most Recent Value  Intake Tab  Date Notified  01/19/18  Palliative Care Type  New Palliative care  Date of Admission  01/29/2018  Date first seen by Palliative Care  2018/02/19  # of days Palliative referral response time  1 Day(s)  # of days IP prior to Palliative referral  9  Clinical Assessment  Palliative Performance Scale Score  20%  Pain Max last 24 hours  Not able to report  Pain Min Last 24 hours  Not able to report  Dyspnea Max Last 24 Hours  Not able to report  Dyspnea Min Last 24 hours  Not able to report  Nausea Max Last 24 Hours  Not able to report  Nausea Min Last 24 Hours  Not able to report  Anxiety Max Last 24 Hours  Not able to report  Anxiety Min Last 24 Hours  Not able to report  Other Max Last 24 Hours  Not able to report  Psychosocial & Spiritual Assessment  Palliative Care Outcomes  Patient/Family meeting held?  Yes  Who was at the meeting?  2 dtr's , son, and grandson  Palliative Care Outcomes  Improved pain  interventions, Improved non-pain symptom therapy, Counseled regarding hospice, Changed to focus on comfort, Changed CPR status, Provided psychosocial or spiritual support, Provided end of life care assistance, Clarified goals of care  Palliative Care follow-up planned  Yes, Facility      Time In: 1000 Time Out: 1110 Time Total: 70 min Greater than 50%  of this time was spent counseling and coordinating care related to the above assessment and plan. Staffed with Dr. Rayann Heman  Signed by: Dory Horn, NP   Please contact Palliative Medicine Team phone at [phone] for questions and concerns.  For individual provider: See Shea Evans

## 2018-02-06 NOTE — Progress Notes (Signed)
Haldol 5 mg given was not effective.  Patient extremely restless, agitated and pulling tubes. On call Bodenheimer, NP notified. Order received for Lorazepam 1 mg IV administered at 0538.  It has not been effective. Patient is still restless.

## 2018-02-06 NOTE — Progress Notes (Signed)
Temp. 95.2 rectal. Warming blanket placed on patient per order. Will recheck temp in a couple of hours.

## 2018-02-06 NOTE — Progress Notes (Signed)
Temp rechecked 95.1 rectal. Patient noted with increased restlessness. PRN Ativan given x 2 not effective. Ambien given at HS not effective. On call Bodenheimer, NP made aware. Order received for Haldol 5 mg x 1 and administered, result pending. Warming blanket still in place. Will recheck temp in a couple of hours. Will continue to monitor.

## 2018-02-06 NOTE — Progress Notes (Signed)
Patient expired at 19:35 with family at bedside. [facility] on call NP notified and signed death certificate. Bullard S, NP with Palliative Care made aware. [facility] choice will be notified by bed placement. Patient will be transported to morgue shortly.

## 2018-02-06 NOTE — Progress Notes (Signed)
Progress Note   Subjective   Agitated overnight,  Approaching end of life.  Family is present at bedside.  Inpatient Medications    Scheduled Meds: .  morphine injection  1 mg Intravenous NOW  . polyvinyl alcohol  2 drop Both Eyes QHS  . sodium chloride flush  3 mL Intravenous Q12H   Continuous Infusions: . sodium chloride 75 mL/hr (01/19/18 2030)   PRN Meds: acetaminophen **OR** acetaminophen, feeding supplement (ENSURE ENLIVE), glycopyrrolate, hydrocortisone, LORazepam, morphine injection, nitroGLYCERIN, [DISCONTINUED] ondansetron **OR** ondansetron (ZOFRAN) IV   Vital Signs    Vitals:   02/02/2018 0346 February 02, 2018 0349 February 02, 2018 0635 02/02/2018 0851  BP:  (!) 125/56  (!) 128/109  Pulse:  (!) 38  (!) 51  Resp:  20    Temp:    (!) 97.5 F (36.4 C)  TempSrc:    Axillary  SpO2:  93% 92% 93%  Weight: 141 lb 11.2 oz (64.3 kg)     Height:        Intake/Output Summary (Last 24 hours) at 02/02/2018 0951 Last data filed at 01/19/2018 1800 Gross per 24 hour  Intake 240 ml  Output -  Net 240 ml   Filed Weights   01/18/18 0638 01/19/18 0520 2018/02/02 0346  Weight: 137 lb (62.1 kg) 139 lb 5.3 oz (63.2 kg) 141 lb 11.2 oz (64.3 kg)    Telemetry    Junctional rhythm - Personally Reviewed  Physical Exam   GEN- The patient is in extremis.  Very agitated.     Head- normocephalic, atraumatic Eyes-  Sclera clear, conjunctiva pink Ears- hearing intact Oropharynx- clear Neck- supple, Lungs- tachypneic Heart- Regular bradycardic and rhythm  GI- soft, NT, ND, + BS Extremities- no clubbing, cyanosis, + dependant edema  MS- diffuse atrophy   Labs    Chemistry Recent Labs  Lab 01/17/18 0426 01/18/18 0520  01/19/18 0428 01/19/18 1141 2018/02/02 0347  NA 119* 117*   < > 114* 113* 112*  K 5.4* 5.4*   < > 5.9* 5.6* 5.8*  CL 90* 88*   < > 86* 86* 85*  CO2 20* 16*   < > 18* 18* 16*  GLUCOSE 138* 167*   < > 124* 117* 144*  BUN 55* 74*   < > 91* 89* 91*  CREATININE 1.21* 1.68*    < > 1.73* 1.78* 1.87*  CALCIUM 8.6* 8.8*   < > 8.1* 7.9* 7.8*  PROT 5.0* 5.4*  --  4.8*  --   --   ALBUMIN 2.0* 2.1*  --  2.0*  --   --   AST 38 46*  --  41  --   --   ALT 36 43  --  44  --   --   ALKPHOS 116 191*  --  125  --   --   BILITOT 0.7 0.7  --  0.6  --   --   GFRNONAA 39* 26*   < > 25* 24* 23*  GFRAA 45* 30*   < > 29* 28* 27*  ANIONGAP 9 13   < > 10 9 11    < > = values in this interval not displayed.     Hematology Recent Labs  Lab 01/19/18 0428 01/19/18 1139 02/02/2018 0347  WBC 12.7* 14.2* 13.3*  RBC 3.09* 3.08* 3.00*  HGB 9.0* 8.9* 8.8*  HCT 25.1* 25.0* 24.3*  MCV 81.2 81.2 81.0  MCH 29.1 28.9 29.3  MCHC 35.9 35.6 36.2*  RDW 15.8* 15.7* 15.5  PLT  314 331 333    Cardiac EnzymesNo results for input(s): TROPONINI in the last 168 hours. No results for input(s): TROPIPOC in the last 168 hours.   Patient Profile   84 y.o.femaleCAD (remote PCI in 2012), HTN, HLD, IBS, CRI, and PAFib, admitted with symptomatic bradycardia and hyponatremia, AKI on CKD.    Assessment & Plan    Ms Christiansen has had progression in her illness overnight.  End of life is near.  Her family is at her bedside and Judson Roch with palliative care is also present providing support. Full comfort measures are now in place.  She is appropriately DNI/DNR.  Will remove telemetry and any unnecessary interventions.   She does not appear to have an actual allergy to morphine.  We will therefore start morphine for agitation and to keep comfortable at this time.  I will follow with you.  Please call if I can assist in any way.  Thompson Grayer MD, [facility] Feb 09, 2018 9:51 AM

## 2018-02-06 DEATH — deceased

## 2018-02-19 ENCOUNTER — Inpatient Hospital Stay: Payer: Medicare Other | Admitting: Internal Medicine

## 2018-03-09 NOTE — Discharge Summary (Addendum)
   Death Summary   CIELLE AGUILA XBW:[id number] DOB: 1927-03-18 DOA: January 23, 2018  PCP: Odette Horns, MD  Admit date: 2018-01-23 Date of Death: 2018/03/02  Final Diagnoses:  Symptomatic Bradycardia Amiodarone Pulmonary toxicity UGIB Hyponatremia Elevated RF Hypomagnesemia Hyperkalemia AKI on CKDIII CAD HTN PAF Deconditioning DVT RUE Severe malnutrition in context of chronic illness  History of present illness:  84 y.o.femalewith a hx of paroxysmal atrial fibrillation not on anticoagulation due to a history of bleeding and high fall risk, HTN, CAD, and hypothyroidism who presented to the ED from her nursing home w/ generalized weakness and bradycardia. She was noted at the nursing home to have a heart rate of 30.  In the ED EKG featured a junctional rhythm with rate 31 and LBBB. Chemistry panel revealed a sodium of 120, potassium 6.1, and creatinine of 2.48, up from 1.00 onemonth prior.   Hospital Course:  The pt suffered a prolonged hospital stay marked by worsening renal failure, anemia, hyponatremia, and profound bradycardia.  Despite out best attempts to stabilize her, Ms. Lurlean Nanny renal failure continued to worsen, as did her overall condition.  Dr. Thompson Grayer, who knew the patient well (dating back to his childhood), had a long discussion with Ms Lacson and her 2 daughters.  It was explained that she was not a candidate for cardiology procedures, and that the risks of cath or pacemaker implantation were prohibitively high.  The patient and her family understood, and the decision was ultimately made to focus on comfort care.    On 02/03/2023 at 19:35 Ms. Egnew died with her family at her bedside.    Signed:  Cherene Altes  [facility] 03-02-2018, 7:05 PM

## 2019-05-15 IMAGING — CT CT HEAD W/O CM
3 series · 15 of 47 positions shown, 18 images · non-contrast
Comparison: None.

CLINICAL DATA: 89-year-old female with head trauma.

EXAM:
CT HEAD WITHOUT CONTRAST
TECHNIQUE: Contiguous axial images were obtained from the base of the skull
through the vertex without intravenous contrast.

[Series 2: head wo · axial · 0.43mm/px · z∈[-93,+32]mm · 9 of 30 slices shown, 12 images]
[im 3/30  brain]
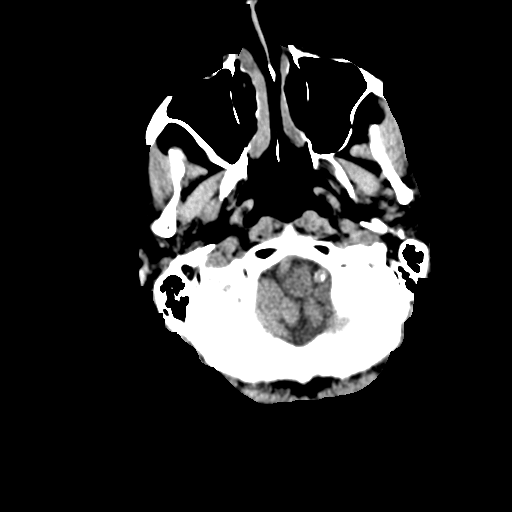
[im 3/30  bone]
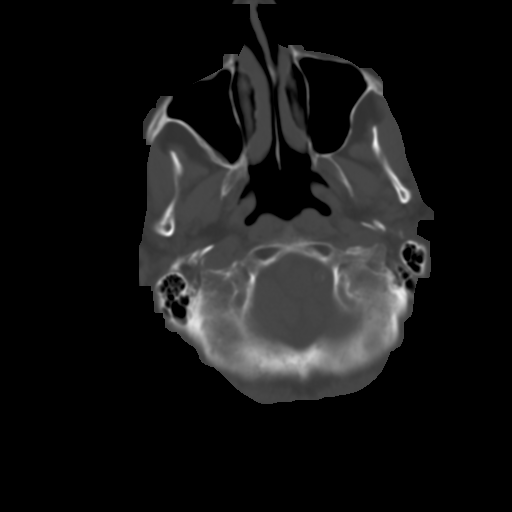
[im 6/30  brain]
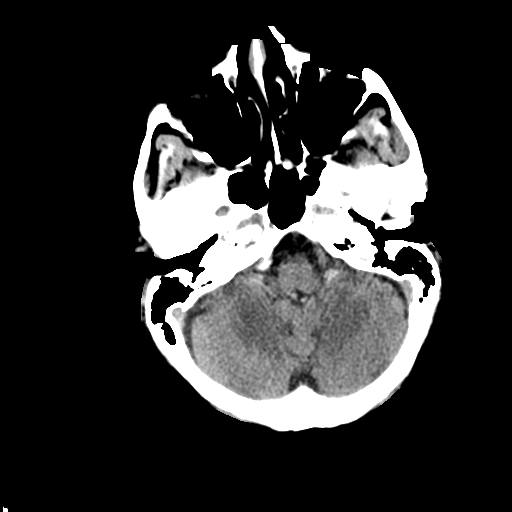
[im 9/30  brain]
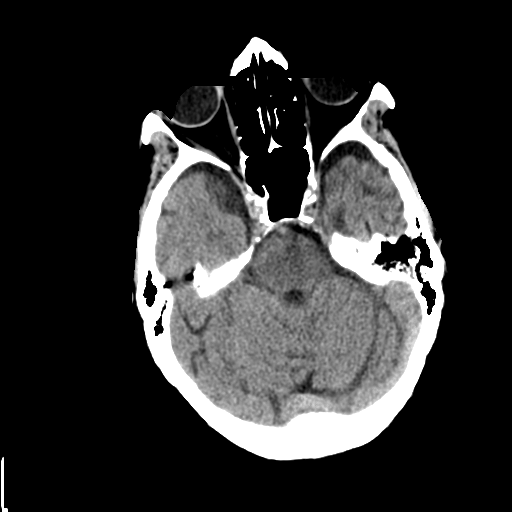
[im 12/30  brain]
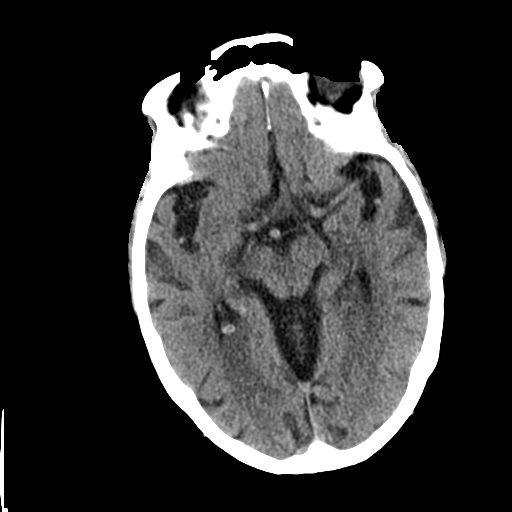
[im 16/30  brain]
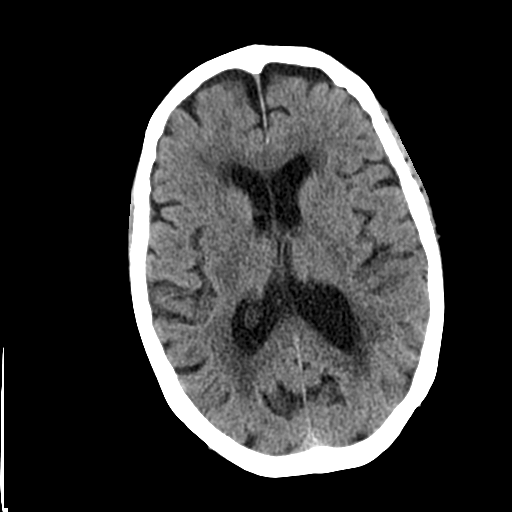
[im 16/30  bone]
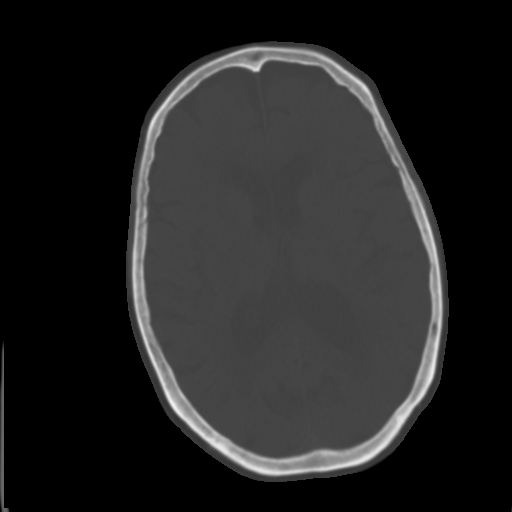
[im 19/30  brain]
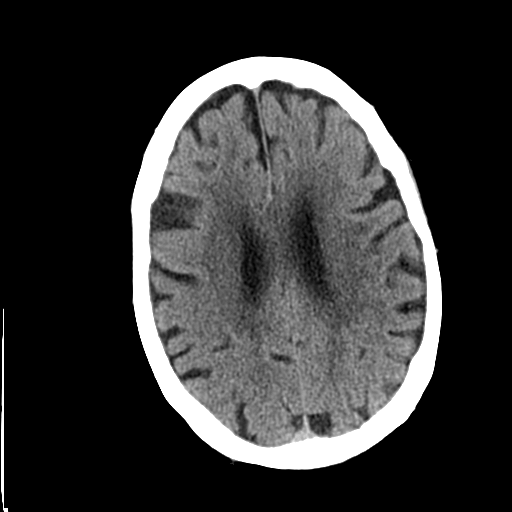
[im 22/30  brain]
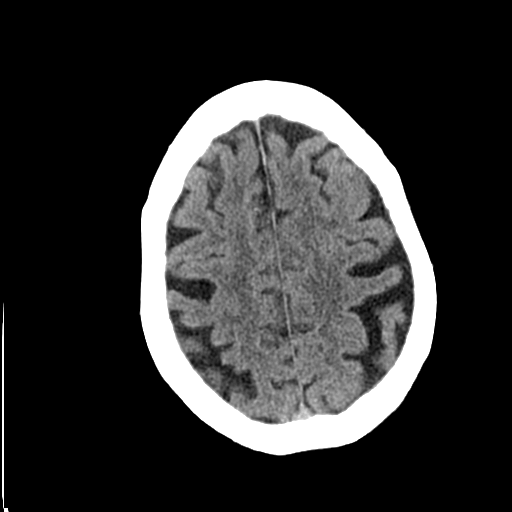
[im 25/30  brain]
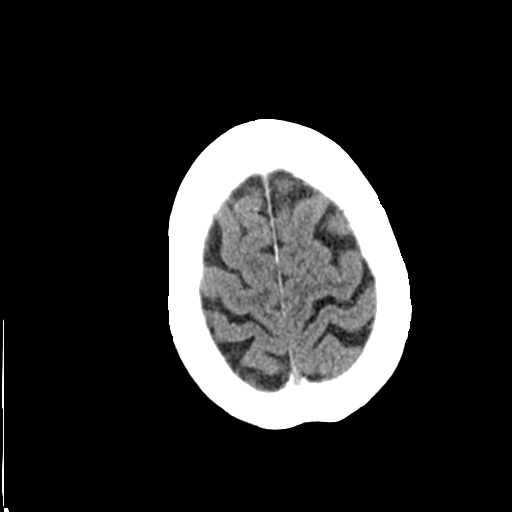
[im 28/30  brain]
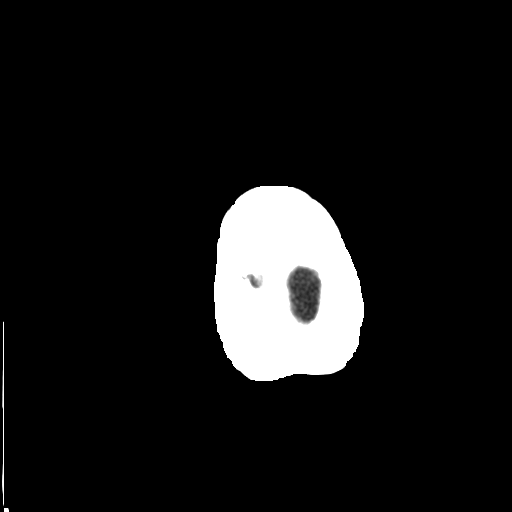
[im 28/30  bone]
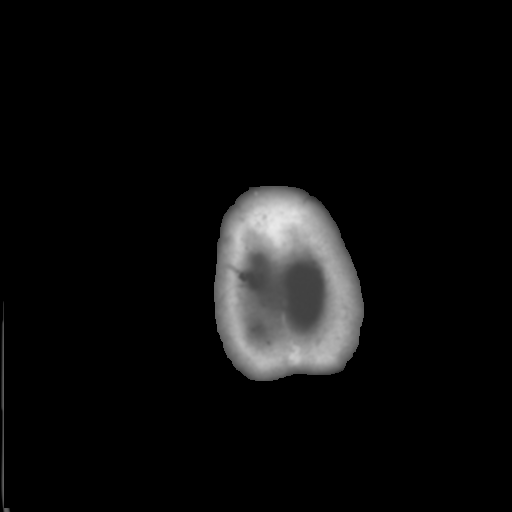

[Series 5: coronal soft tissue · coronal · 0.29mm/px · 3 of 70 slices shown]
[im 24/70  brain]
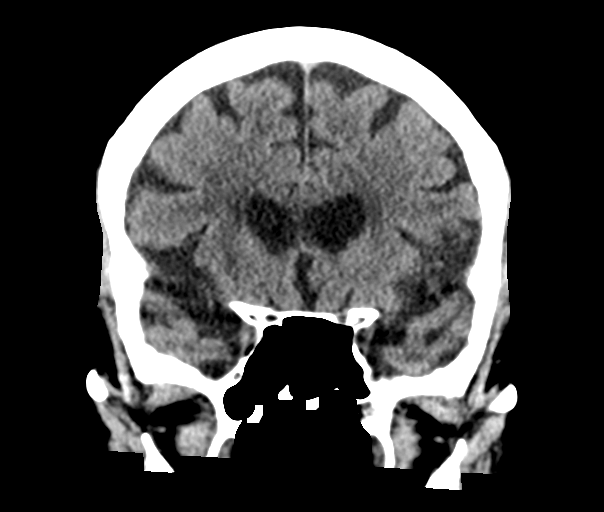
[im 31/70  brain]
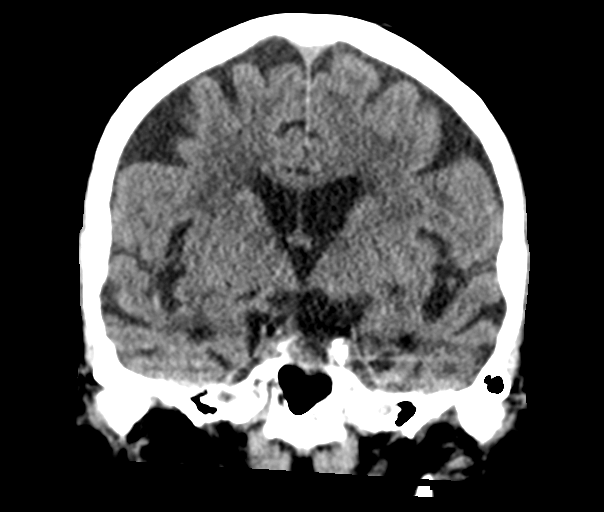
[im 39/70  brain]
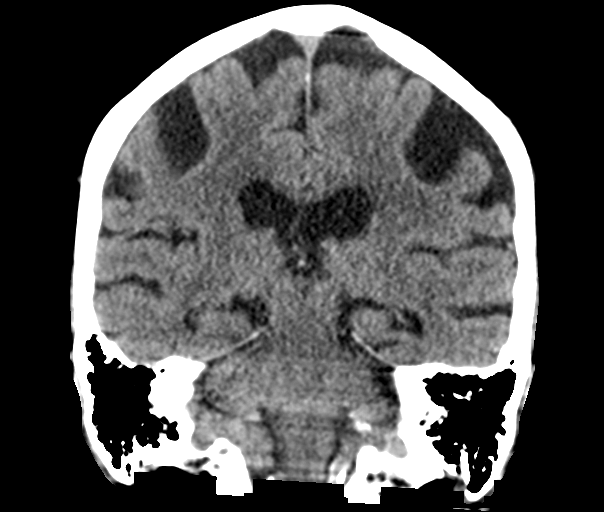

[Series 6: sagittal soft tissue · sagittal · 0.32mm/px · 3 of 48 slices shown]
[im 16/48  brain]
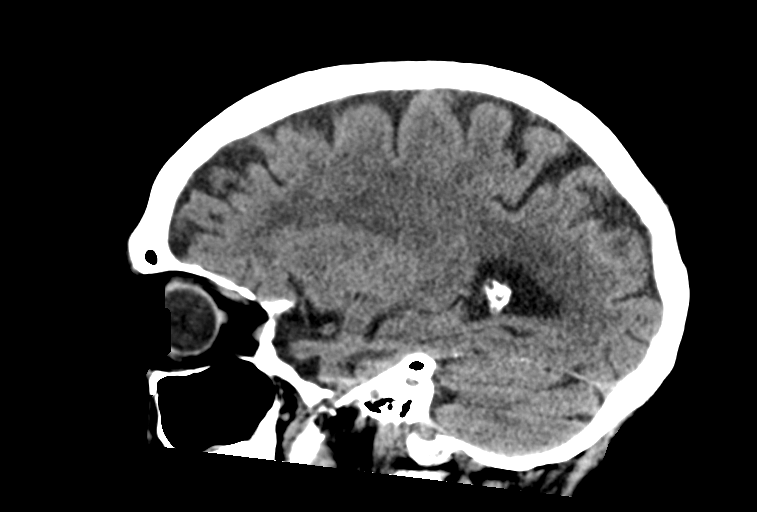
[im 24/48  brain]
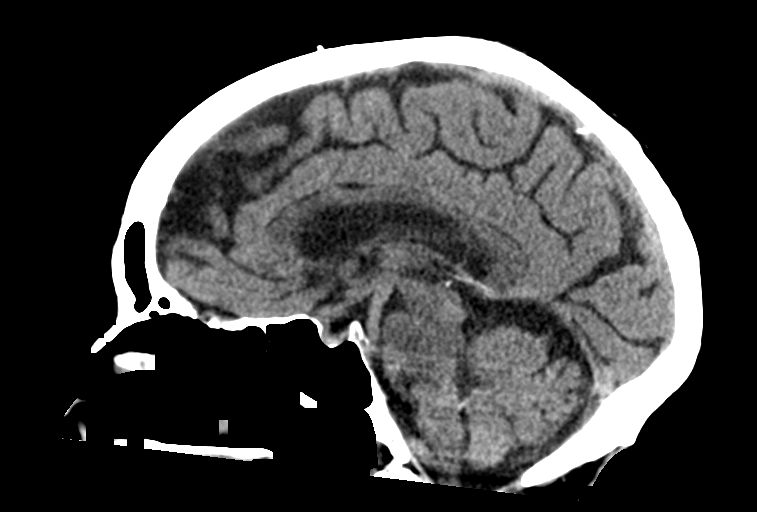
[im 32/48  brain]
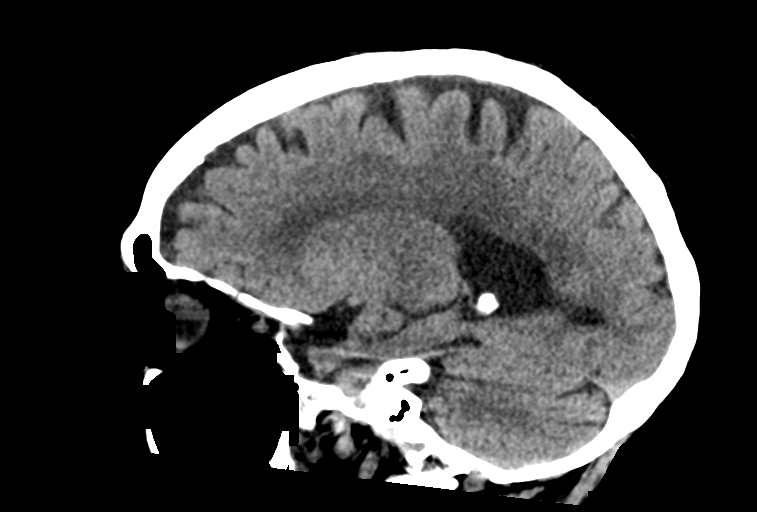

[15 of 47 positions shown; findings below may reference images not displayed]

FINDINGS: Brain: No evidence of acute infarction, hemorrhage, hydrocephalus,
extra-axial collection or mass lesion/mass effect.

Atrophy and chronic small-vessel white matter ischemic changes
noted.

Vascular: Atherosclerotic calcifications noted.

Skull: Normal. Negative for fracture or focal lesion.

Sinuses/Orbits: No acute abnormality

Other: None.
IMPRESSION: 1. No evidence of acute intracranial abnormality
2. Atrophy and chronic small-vessel white matter ischemic changes.
